# Patient Record
Sex: Male | Born: 1985 | Hispanic: Refuse to answer | Smoking: Former smoker
Health system: Southern US, Community
[De-identification: ages and names within clinical notes are randomized; demographics above are authoritative.]

## PROBLEM LIST (undated history)

## (undated) DIAGNOSIS — T7840XA Allergy, unspecified, initial encounter: Secondary | ICD-10-CM

## (undated) DIAGNOSIS — S91331A Puncture wound without foreign body, right foot, initial encounter: Secondary | ICD-10-CM

## (undated) DIAGNOSIS — R079 Chest pain, unspecified: Secondary | ICD-10-CM

## (undated) DIAGNOSIS — A048 Other specified bacterial intestinal infections: Secondary | ICD-10-CM

## (undated) DIAGNOSIS — N419 Inflammatory disease of prostate, unspecified: Secondary | ICD-10-CM

## (undated) HISTORY — DX: Inflammatory disease of prostate, unspecified: N41.9

## (undated) HISTORY — DX: Puncture wound without foreign body, right foot, initial encounter: S91.331A

## (undated) HISTORY — PX: ROOT CANAL: SHX2363

## (undated) HISTORY — DX: Allergy, unspecified, initial encounter: T78.40XA

## (undated) HISTORY — PX: MOUTH SURGERY: SHX715

## (undated) HISTORY — DX: Chest pain, unspecified: R07.9

---
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
## 2006-02-02 ENCOUNTER — Emergency Department (HOSPITAL_COMMUNITY): Admission: EM | Admit: 2006-02-02 | Discharge: 2006-02-02 | Payer: Self-pay | Admitting: Emergency Medicine

## 2006-02-14 ENCOUNTER — Emergency Department (HOSPITAL_COMMUNITY): Admission: EM | Admit: 2006-02-14 | Discharge: 2006-02-14 | Payer: Self-pay | Admitting: Emergency Medicine

## 2006-05-07 ENCOUNTER — Emergency Department (HOSPITAL_COMMUNITY): Admission: EM | Admit: 2006-05-07 | Discharge: 2006-05-07 | Payer: Self-pay | Admitting: Emergency Medicine

## 2006-05-22 ENCOUNTER — Emergency Department (HOSPITAL_COMMUNITY): Admission: EM | Admit: 2006-05-22 | Discharge: 2006-05-22 | Payer: Self-pay | Admitting: Emergency Medicine

## 2008-02-14 ENCOUNTER — Emergency Department (HOSPITAL_COMMUNITY): Admission: EM | Admit: 2008-02-14 | Discharge: 2008-02-14 | Payer: Self-pay | Admitting: Emergency Medicine

## 2008-06-07 ENCOUNTER — Emergency Department (HOSPITAL_COMMUNITY): Admission: EM | Admit: 2008-06-07 | Discharge: 2008-06-07 | Payer: Self-pay | Admitting: Emergency Medicine

## 2008-11-18 ENCOUNTER — Emergency Department (HOSPITAL_COMMUNITY): Admission: EM | Admit: 2008-11-18 | Discharge: 2008-11-18 | Payer: Self-pay | Admitting: Emergency Medicine

## 2008-12-10 ENCOUNTER — Emergency Department (HOSPITAL_COMMUNITY): Admission: EM | Admit: 2008-12-10 | Discharge: 2008-12-10 | Payer: Self-pay | Admitting: Emergency Medicine

## 2009-03-11 ENCOUNTER — Emergency Department (HOSPITAL_COMMUNITY): Admission: EM | Admit: 2009-03-11 | Discharge: 2009-03-11 | Payer: Self-pay | Admitting: Emergency Medicine

## 2010-11-09 LAB — DIFFERENTIAL
Basophils Absolute: 0 10*3/uL (ref 0.0–0.1)
Basophils Relative: 0 % (ref 0–1)
Eosinophils Absolute: 0 10*3/uL (ref 0.0–0.7)
Eosinophils Relative: 0 % (ref 0–5)
Lymphocytes Relative: 12 % (ref 12–46)
Lymphs Abs: 1 10*3/uL (ref 0.7–4.0)
Monocytes Absolute: 0.4 10*3/uL (ref 0.1–1.0)
Monocytes Relative: 4 % (ref 3–12)
Neutro Abs: 6.8 10*3/uL (ref 1.7–7.7)
Neutrophils Relative %: 83 % — ABNORMAL HIGH (ref 43–77)

## 2010-11-09 LAB — CBC
HCT: 52.1 % — ABNORMAL HIGH (ref 39.0–52.0)
Hemoglobin: 18.1 g/dL — ABNORMAL HIGH (ref 13.0–17.0)
MCHC: 34.8 g/dL (ref 30.0–36.0)
MCV: 99.2 fL (ref 78.0–100.0)
Platelets: 205 10*3/uL (ref 150–400)
RBC: 5.26 MIL/uL (ref 4.22–5.81)
RDW: 13.5 % (ref 11.5–15.5)
WBC: 8.1 10*3/uL (ref 4.0–10.5)

## 2010-11-09 LAB — LIPASE, BLOOD: Lipase: 25 U/L (ref 11–59)

## 2010-11-09 LAB — COMPREHENSIVE METABOLIC PANEL
ALT: 25 U/L (ref 0–53)
AST: 49 U/L — ABNORMAL HIGH (ref 0–37)
Albumin: 4.7 g/dL (ref 3.5–5.2)
Alkaline Phosphatase: 62 U/L (ref 39–117)
BUN: 9 mg/dL (ref 6–23)
CO2: 27 mEq/L (ref 19–32)
Calcium: 10 mg/dL (ref 8.4–10.5)
Chloride: 101 mEq/L (ref 96–112)
Creatinine, Ser: 0.97 mg/dL (ref 0.4–1.5)
GFR calc Af Amer: >60 mL/min (ref >60)
GFR calc non Af Amer: >60 mL/min (ref >60)
Glucose, Bld: 114 mg/dL — ABNORMAL HIGH (ref 70–99)
Potassium: 5.3 mEq/L — ABNORMAL HIGH (ref 3.5–5.1)
Sodium: 139 mEq/L (ref 135–145)
Total Bilirubin: 1.6 mg/dL — ABNORMAL HIGH (ref 0.3–1.2)
Total Protein: 7.5 g/dL (ref 6.0–8.3)

## 2010-11-12 LAB — POCT INFECTIOUS MONO SCREEN: Mono Screen: POSITIVE — AB

## 2010-11-12 LAB — POCT RAPID STREP A (OFFICE): Streptococcus, Group A Screen (Direct): NEGATIVE

## 2010-11-13 LAB — GC/CHLAMYDIA PROBE AMP, GENITAL
Chlamydia, DNA Probe: NEGATIVE
GC Probe Amp, Genital: NEGATIVE

## 2010-11-13 LAB — RPR: RPR Ser Ql: NONREACTIVE

## 2010-11-13 LAB — HIV ANTIBODY (ROUTINE TESTING W REFLEX): HIV: NONREACTIVE

## 2010-11-27 ENCOUNTER — Inpatient Hospital Stay (INDEPENDENT_AMBULATORY_CARE_PROVIDER_SITE_OTHER)
Admission: RE | Admit: 2010-11-27 | Discharge: 2010-11-27 | Disposition: A | Discharge status: Home / Self Care | Payer: Self-pay | Source: Ambulatory Visit | Attending: Family Medicine | Admitting: Family Medicine

## 2010-11-27 DIAGNOSIS — R109 Unspecified abdominal pain: Secondary | ICD-10-CM

## 2010-11-27 LAB — POCT I-STAT, CHEM 8
BUN: 8 mg/dL (ref 6–23)
Calcium, Ion: 1.18 mmol/L (ref 1.12–1.32)
Chloride: 102 mEq/L (ref 96–112)
Creatinine, Ser: 1 mg/dL (ref 0.4–1.5)
Glucose, Bld: 98 mg/dL (ref 70–99)
HCT: 50 % (ref 39.0–52.0)
Hemoglobin: 17 g/dL (ref 13.0–17.0)
Potassium: 3.9 mEq/L (ref 3.5–5.1)
Sodium: 140 mEq/L (ref 135–145)
TCO2: 30 mmol/L (ref 0–100)

## 2010-11-27 LAB — POCT URINALYSIS DIP (DEVICE)
Bilirubin Urine: NEGATIVE
Glucose, UA: NEGATIVE mg/dL
Hgb urine dipstick: NEGATIVE
Ketones, ur: NEGATIVE mg/dL
Nitrite: NEGATIVE
Protein, ur: NEGATIVE mg/dL
Specific Gravity, Urine: 1.02 (ref 1.005–1.030)
Urobilinogen, UA: 1 mg/dL (ref 0.0–1.0)
pH: 7 (ref 5.0–8.0)

## 2011-04-30 ENCOUNTER — Inpatient Hospital Stay (INDEPENDENT_AMBULATORY_CARE_PROVIDER_SITE_OTHER)
Admission: RE | Admit: 2011-04-30 | Discharge: 2011-04-30 | Disposition: A | Discharge status: Home / Self Care | Payer: Self-pay | Source: Ambulatory Visit | Attending: Family Medicine | Admitting: Family Medicine

## 2011-04-30 DIAGNOSIS — J029 Acute pharyngitis, unspecified: Secondary | ICD-10-CM

## 2011-04-30 DIAGNOSIS — R51 Headache: Secondary | ICD-10-CM

## 2011-05-02 ENCOUNTER — Emergency Department (HOSPITAL_COMMUNITY)
Admission: EM | Admit: 2011-05-02 | Discharge: 2011-05-03 | Disposition: A | Discharge status: Home / Self Care | Payer: Self-pay | Attending: Emergency Medicine | Admitting: Emergency Medicine

## 2011-05-02 DIAGNOSIS — J029 Acute pharyngitis, unspecified: Secondary | ICD-10-CM | POA: Insufficient documentation

## 2011-05-02 DIAGNOSIS — B9789 Other viral agents as the cause of diseases classified elsewhere: Secondary | ICD-10-CM | POA: Insufficient documentation

## 2011-05-02 DIAGNOSIS — R112 Nausea with vomiting, unspecified: Secondary | ICD-10-CM | POA: Insufficient documentation

## 2011-05-02 DIAGNOSIS — R197 Diarrhea, unspecified: Secondary | ICD-10-CM | POA: Insufficient documentation

## 2011-05-02 DIAGNOSIS — R599 Enlarged lymph nodes, unspecified: Secondary | ICD-10-CM | POA: Insufficient documentation

## 2011-05-02 DIAGNOSIS — J45909 Unspecified asthma, uncomplicated: Secondary | ICD-10-CM | POA: Insufficient documentation

## 2011-06-16 ENCOUNTER — Emergency Department (INDEPENDENT_AMBULATORY_CARE_PROVIDER_SITE_OTHER)
Admission: EM | Admit: 2011-06-16 | Discharge: 2011-06-16 | Disposition: A | Discharge status: Home / Self Care | Payer: Self-pay | Source: Home / Self Care | Attending: Emergency Medicine | Admitting: Emergency Medicine

## 2011-06-16 DIAGNOSIS — J45909 Unspecified asthma, uncomplicated: Secondary | ICD-10-CM

## 2011-06-16 DIAGNOSIS — R110 Nausea: Secondary | ICD-10-CM

## 2011-06-16 DIAGNOSIS — Z202 Contact with and (suspected) exposure to infections with a predominantly sexual mode of transmission: Secondary | ICD-10-CM

## 2011-06-16 LAB — HIV ANTIBODY (ROUTINE TESTING W REFLEX): HIV: NONREACTIVE

## 2011-06-16 MED ORDER — LIDOCAINE HCL (PF) 1 % IJ SOLN
INTRAMUSCULAR | Status: AC
  Filled 2011-06-16: qty 5

## 2011-06-16 MED ORDER — AZITHROMYCIN 250 MG PO TABS
1000.0000 mg | ORAL_TABLET | Freq: Once | ORAL | Status: AC
  Administered 2011-06-16: 1000 mg via ORAL

## 2011-06-16 MED ORDER — CEFTRIAXONE SODIUM 250 MG IJ SOLR
INTRAMUSCULAR | Status: AC
  Filled 2011-06-16: qty 250

## 2011-06-16 MED ORDER — AZITHROMYCIN 250 MG PO TABS
ORAL_TABLET | ORAL | Status: AC
  Filled 2011-06-16: qty 4

## 2011-06-16 MED ORDER — ONDANSETRON 8 MG PO TBDP: 8.0000 mg | ORAL_TABLET | Freq: Three times a day (TID) | ORAL | Status: AC | PRN

## 2011-06-16 MED ORDER — CEFTRIAXONE SODIUM 250 MG IJ SOLR
250.0000 mg | Freq: Once | INTRAMUSCULAR | Status: AC
  Administered 2011-06-16: 250 mg via INTRAMUSCULAR

## 2011-06-16 MED ORDER — ALBUTEROL SULFATE HFA 108 (90 BASE) MCG/ACT IN AERS: 1.0000 | INHALATION_SPRAY | Freq: Four times a day (QID) | RESPIRATORY_TRACT | Status: DC | PRN

## 2011-06-16 NOTE — ED Provider Notes (Signed)
History     CSN: [id number] Arrival date & time: 06/16/2011  4:36 PM   First MD Initiated Contact with Patient 06/16/11 1657      Chief Complaint  Patient presents with  . Exposure to STD    Pt. states that girlfriend told him she gonahrea today.  Pt. denies any symptoms.      (Consider location/radiation/quality/duration/timing/severity/associated sxs/prior treatment) HPI Comments: Michael Rogers was informed by is girlfriend that she has gonorrhea. His last sexual intercourse was 2 weeks ago. He himself denies any discharge, dysuria, penile pain, testicular pain, adenopathy, abdominal pain, fever, chills, or skin rash. He states he was incarcerated several months ago and when he got out he was tested for all STDs and found to be negative.  He also has asthma which is controlled with when necessary albuterol via [facility] inhaler. He states he uses this infrequently. He also has vomiting just about every morning. He was evaluated for this and told he might have an ulcer. He has been given promethazine in the past but the promethazine tasted bad and he did not like to take it.  Patient is a 25 y.o. male presenting with STD exposure.  Exposure to STD Pertinent negatives include no abdominal pain.    Past Medical History  Diagnosis Date  . Asthma     History reviewed. No pertinent past surgical history.  History reviewed. No pertinent family history.  History  Substance Use Topics  . Smoking status: Current Some Day Smoker    Types: Cigarettes  . Smokeless tobacco: Not on file  . Alcohol Use: No      Review of Systems  Constitutional: Negative for fever and chills.  Respiratory: Positive for wheezing.   Gastrointestinal: Positive for nausea and vomiting. Negative for abdominal pain.  Genitourinary: Negative for dysuria, urgency, frequency, hematuria, flank pain, discharge, penile swelling, scrotal swelling, genital sores, penile pain and testicular pain.    Allergies  Review of  patient's allergies indicates no known allergies.  Home Medications   Current Outpatient Rx  Name Route Sig Dispense Refill  . ALBUTEROL SULFATE HFA 108 (90 BASE) MCG/ACT IN AERS Inhalation Inhale 1-2 puffs into the lungs every 6 (six) hours as needed for wheezing. 1 Inhaler 0  . ONDANSETRON 8 MG PO TBDP Oral Take 1 tablet (8 mg total) by mouth every 8 (eight) hours as needed for nausea. 20 tablet 0    BP 132/85  Pulse 60  Temp(Src) 99.4 F (37.4 C) (Oral)  Resp 14  SpO2 100%  Physical Exam  Nursing note and vitals reviewed. Constitutional: He appears well-developed and well-nourished. No distress.  Cardiovascular: Normal rate and regular rhythm.  Exam reveals no gallop and no friction rub.   No murmur heard. Pulmonary/Chest: Effort normal. No respiratory distress. He has no wheezes. He has no rales.  Abdominal: Soft. Bowel sounds are normal. He exhibits no distension and no mass. There is no hepatosplenomegaly. There is no tenderness. There is no rebound, no guarding and no CVA tenderness.  Genitourinary: Penis normal. No penile tenderness.       Exam of the external genitalia was unremarkable. There were no lesions of the penis, no urethral discharge, no penile tenderness to palpation, no testicular tenderness, mass, or inguinal lymphadenopathy.  Skin: He is not diaphoretic.    ED Course  Procedures (including critical care time)   Labs Reviewed  GC/CHLAMYDIA PROBE AMP, GENITAL  RPR   No results found.   1. Exposure to STD   2.  Asthma   3. Nausea       MDM  He has been exposed to gonorrhea but right now has no symptoms. We'll treat him gonorrhea after culturing and obtaining is for Chlamydia, RPR, and HIV. He was given Zofran for the nausea and an albuterol inhaler.        Roque Lias, MD 06/16/11 1754

## 2011-06-16 NOTE — ED Notes (Signed)
Pt. states that girlfriend told him she gonahrea today.  Pt. denies any symptoms.

## 2011-06-17 LAB — GC/CHLAMYDIA PROBE AMP, GENITAL
Chlamydia, DNA Probe: NEGATIVE
GC Probe Amp, Genital: NEGATIVE

## 2011-06-17 LAB — RPR: RPR Ser Ql: NONREACTIVE

## 2012-01-21 ENCOUNTER — Encounter: Payer: Self-pay | Admitting: Family Medicine

## 2012-01-21 ENCOUNTER — Ambulatory Visit (INDEPENDENT_AMBULATORY_CARE_PROVIDER_SITE_OTHER): Payer: Self-pay | Admitting: Family Medicine

## 2012-01-21 ENCOUNTER — Other Ambulatory Visit (HOSPITAL_COMMUNITY)
Admission: RE | Admit: 2012-01-21 | Discharge: 2012-01-21 | Disposition: A | Discharge status: Home / Self Care | Payer: Self-pay | Source: Ambulatory Visit | Attending: Family Medicine | Admitting: Family Medicine

## 2012-01-21 VITALS — BP 120/83 | HR 109 | Ht 68.0 in | Wt 150.0 lb

## 2012-01-21 DIAGNOSIS — Z2089 Contact with and (suspected) exposure to other communicable diseases: Secondary | ICD-10-CM

## 2012-01-21 DIAGNOSIS — Z113 Encounter for screening for infections with a predominantly sexual mode of transmission: Secondary | ICD-10-CM

## 2012-01-21 DIAGNOSIS — Z202 Contact with and (suspected) exposure to infections with a predominantly sexual mode of transmission: Secondary | ICD-10-CM

## 2012-01-21 DIAGNOSIS — R51 Headache: Secondary | ICD-10-CM

## 2012-01-21 DIAGNOSIS — J309 Allergic rhinitis, unspecified: Secondary | ICD-10-CM

## 2012-01-21 DIAGNOSIS — K219 Gastro-esophageal reflux disease without esophagitis: Secondary | ICD-10-CM

## 2012-01-21 DIAGNOSIS — J4520 Mild intermittent asthma, uncomplicated: Secondary | ICD-10-CM

## 2012-01-21 DIAGNOSIS — J45909 Unspecified asthma, uncomplicated: Secondary | ICD-10-CM

## 2012-01-21 DIAGNOSIS — A048 Other specified bacterial intestinal infections: Secondary | ICD-10-CM | POA: Insufficient documentation

## 2012-01-21 DIAGNOSIS — IMO0001 Reserved for inherently not codable concepts without codable children: Secondary | ICD-10-CM

## 2012-01-21 DIAGNOSIS — G8929 Other chronic pain: Secondary | ICD-10-CM

## 2012-01-21 DIAGNOSIS — J302 Other seasonal allergic rhinitis: Secondary | ICD-10-CM

## 2012-01-21 MED ORDER — PANTOPRAZOLE SODIUM 40 MG PO TBEC: 40.0000 mg | DELAYED_RELEASE_TABLET | Freq: Two times a day (BID) | ORAL | Status: DC

## 2012-01-21 MED ORDER — ALBUTEROL SULFATE HFA 108 (90 BASE) MCG/ACT IN AERS: 1.0000 | INHALATION_SPRAY | Freq: Four times a day (QID) | RESPIRATORY_TRACT | Status: DC | PRN

## 2012-01-21 MED ORDER — AMOXICILLIN 500 MG PO CAPS: 1000.0000 mg | ORAL_CAPSULE | Freq: Two times a day (BID) | ORAL | Status: AC

## 2012-01-21 MED ORDER — METRONIDAZOLE 250 MG PO TABS: 250.0000 mg | ORAL_TABLET | Freq: Four times a day (QID) | ORAL | Status: AC

## 2012-01-21 NOTE — Assessment & Plan Note (Signed)
Checked per patient request.

## 2012-01-21 NOTE — Assessment & Plan Note (Signed)
Refilled albuterol  Advised if using more than twice per week, need office visit for further evaluation.

## 2012-01-21 NOTE — Patient Instructions (Addendum)
Take zyrtec or claritin or allegra for allergies.  Keep headache log and bring back to next visit Triple therapy for H Pylori ulcer Follow-up in 1 month  Helicobacter Pylori Disease Often patients with stomach or duodenal ulcers not caused by irritants, are infected with a germ. The germ is called helicobacter pylori (H. pylori). This bacterium lives on the surface of stomach and small bowel. It can cause redness, soreness and ulcers. Ulcers are a hole in the lining of your stomach or small bowel. Blood and special breath tests can detect if you are infected with H. pylori. Tests can be done on samples taken from the stomach if you have endoscopy. After treatment you may have tests to prove you are cured. These can be done about a month after you finish the treatment or as your caregiver suggests. Most infections can be cured with a combination of antibiotics. Antibiotics are medications which kill germs such as H. pylori. Anti-ulcer medicines which block stomach acid secretion may also be used. Treatment will be continued for the time your caregiver suggests. Call your caregiver if you need more information about H. pylori. Call also if your symptoms get worse during or after treatment. You will not need a special diet. Avoid:  Smoking.   Aspirin.   Ibuprofen.   Other anti-inflammatory drugs.  Alcohol and spicy foods may also make your symptoms worse. The best advice is to avoid anything you find upsetting to your stomach. SEEK IMMEDIATE MEDICAL CARE IF:  You develop sharp, sudden, lasting stomach pain.   You have bloody vomit or vomit that looks like coffee grounds.   You have bloody or black stools.   You develop a lightheaded feeling, fainting, or become weak and sweaty.  Document Released: 07/21/2005 Document Revised: 07/10/2011 Document Reviewed: 01/06/2007 [facility] Patient Information 2012 [place], [place].
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
## 2012-01-21 NOTE — Assessment & Plan Note (Signed)
Advised on using second generation antihistamine

## 2012-01-21 NOTE — Assessment & Plan Note (Signed)
Chronic headache, migrainous in nature.  No red flags.  Will not add NSAID at this time due to [facility] Pylori treatment.  Gave him headache log to fill out and bring back at next appt for further discussion.  We discussed approach to overall health including stopping tobacco and marijuana as important.

## 2012-01-21 NOTE — Assessment & Plan Note (Signed)
rxed triple antibiotic therapy with amoxicillin, metronidazole (clarithromycin cost prohibitive), and pantroprazole (patient states intolerant to omeprazole)  Discussed food choiced and stopping smoking to also help with reflux

## 2012-01-23 ENCOUNTER — Encounter: Payer: Self-pay | Admitting: Family Medicine

## 2012-02-20 ENCOUNTER — Encounter: Payer: Self-pay | Admitting: Family Medicine

## 2012-02-20 ENCOUNTER — Ambulatory Visit (INDEPENDENT_AMBULATORY_CARE_PROVIDER_SITE_OTHER): Payer: Self-pay | Admitting: Family Medicine

## 2012-02-20 VITALS — BP 113/73 | HR 75 | Ht 68.0 in | Wt 153.0 lb

## 2012-02-20 DIAGNOSIS — A048 Other specified bacterial intestinal infections: Secondary | ICD-10-CM

## 2012-02-20 DIAGNOSIS — J45909 Unspecified asthma, uncomplicated: Secondary | ICD-10-CM

## 2012-02-20 DIAGNOSIS — Z720 Tobacco use: Secondary | ICD-10-CM

## 2012-02-20 DIAGNOSIS — F17200 Nicotine dependence, unspecified, uncomplicated: Secondary | ICD-10-CM

## 2012-02-20 DIAGNOSIS — J4520 Mild intermittent asthma, uncomplicated: Secondary | ICD-10-CM

## 2012-02-20 NOTE — Assessment & Plan Note (Signed)
advised as he is not yet done with triple therapy, to give a fe more weeks for full resolution.  Will plan on continuing PI once daily

## 2012-02-20 NOTE — Assessment & Plan Note (Signed)
Encouraged flu shot in fall

## 2012-02-20 NOTE — Progress Notes (Signed)
  Subjective:    Patient ID: Michael Rogers, male    DOB: November 19, 1985, 26 y.o.   MRN: [id number]  HPI Here for one month follow-up of new patient appt.  Also plans to follow-up with Jaynee Eagles this afternoon.  H. Pylori:  Is still finishing two week course of triple therapy (started late)  Having some dyspepsia.  Somewhat improving.  Having some loose stools.    Asthma:   Has not needed to use albuterol  Tobacco use:  Smoking 1-3 cig per day.  Plans on quitting. Review of Systems See HPI    Objective:   Physical Exam GEN: Alert & Oriented, No acute distress CV:  Regular Rate & Rhythm, no murmur Respiratory:  Normal work of breathing, CTAB Abd:  + BS, soft, no tenderness to palpation         Assessment & Plan:

## 2012-02-20 NOTE — Assessment & Plan Note (Signed)
Low nicotine dependence.  Discussed tips for quitting and behaviors he can substitute- states his biggest ally is his girlfriend who serves to remind him as a deterrent to smoking.

## 2012-02-20 NOTE — Patient Instructions (Addendum)
Try probiotics such as yogurt with live cultures Michael Rogers) to help with loose stools after diarrhea  Great job with cutting back smoking  Get your flu and tetanus shot in the fall.  Follow-up yearly or as needed.

## 2012-04-02 ENCOUNTER — Ambulatory Visit: Payer: Self-pay | Admitting: Family Medicine

## 2012-04-05 ENCOUNTER — Emergency Department (HOSPITAL_COMMUNITY)
Admission: EM | Admit: 2012-04-05 | Discharge: 2012-04-05 | Disposition: A | Discharge status: Home / Self Care | Payer: Self-pay | Attending: Emergency Medicine | Admitting: Emergency Medicine

## 2012-04-05 ENCOUNTER — Encounter (HOSPITAL_COMMUNITY): Payer: Self-pay | Admitting: *Deleted

## 2012-04-05 DIAGNOSIS — F17200 Nicotine dependence, unspecified, uncomplicated: Secondary | ICD-10-CM | POA: Insufficient documentation

## 2012-04-05 DIAGNOSIS — T6391XA Toxic effect of contact with unspecified venomous animal, accidental (unintentional), initial encounter: Secondary | ICD-10-CM | POA: Insufficient documentation

## 2012-04-05 DIAGNOSIS — J45909 Unspecified asthma, uncomplicated: Secondary | ICD-10-CM | POA: Insufficient documentation

## 2012-04-05 DIAGNOSIS — T63461A Toxic effect of venom of wasps, accidental (unintentional), initial encounter: Secondary | ICD-10-CM | POA: Insufficient documentation

## 2012-04-05 DIAGNOSIS — Z8249 Family history of ischemic heart disease and other diseases of the circulatory system: Secondary | ICD-10-CM | POA: Insufficient documentation

## 2012-04-05 DIAGNOSIS — Z841 Family history of disorders of kidney and ureter: Secondary | ICD-10-CM | POA: Insufficient documentation

## 2012-04-05 HISTORY — DX: Other specified bacterial intestinal infections: A04.8

## 2012-04-05 MED ORDER — DIPHENHYDRAMINE HCL 25 MG PO CAPS
25.0000 mg | ORAL_CAPSULE | Freq: Once | ORAL | Status: AC
  Administered 2012-04-05: 25 mg via ORAL
  Filled 2012-04-05: qty 1

## 2012-04-05 NOTE — ED Provider Notes (Signed)
History     CSN: [id number]  Arrival date & time 04/05/12  Paulo Fruit   First MD Initiated Contact with Patient 04/05/12 1914      Chief Complaint  Patient presents with  . Insect Bite    (Consider location/radiation/quality/duration/timing/severity/associated sxs/prior treatment) HPI Comments: Patient reports that yesterday he was bitten by a yellow jacket on his right forearm.  He reports that he has had some swelling and pain of the area.  He was concerned that he was having an allergic reaction.  He denies any swelling of his throat, lips, or tongue.  Denies shortness of breath.  He has not taken anything for his symptoms.  The history is provided by the patient.    Past Medical History  Diagnosis Date  . Asthma   . Allergy   . H. pylori infection     History reviewed. No pertinent past surgical history.  Family History  Problem Relation Age of Onset  . Kidney disease Father   . Hypertension Paternal Grandmother     History  Substance Use Topics  . Smoking status: Current Some Day Smoker    Types: Cigarettes  . Smokeless tobacco: Not on file  . Alcohol Use: No      Review of Systems  Constitutional: Negative for fever and chills.  HENT: Negative for drooling and trouble swallowing.   Respiratory: Negative for shortness of breath.   Skin: Positive for color change.    Allergies  Review of patient's allergies indicates no known allergies.  Home Medications   Current Outpatient Rx  Name Route Sig Dispense Refill  . ALBUTEROL SULFATE HFA 108 (90 BASE) MCG/ACT IN AERS Inhalation Inhale 1-2 puffs into the lungs every 6 (six) hours as needed.    Marland Kitchen PANTOPRAZOLE SODIUM 40 MG PO TBEC Oral Take 40 mg by mouth 2 (two) times daily. For 14 days      BP 116/69  Pulse 73  Temp 98.9 F (37.2 C) (Oral)  Resp 18  SpO2 99%  Physical Exam  Nursing note and vitals reviewed. Constitutional: He appears well-developed and well-nourished. No distress.  HENT:  Head:  Normocephalic and atraumatic.  Mouth/Throat: Oropharynx is clear and moist.       No swelling of the lips or tongue Airway patent.    Neck: Normal range of motion.  Cardiovascular: Normal rate, regular rhythm and normal heart sounds.   Pulses:      Radial pulses are 2+ on the right side, and 2+ on the left side.  Pulmonary/Chest: Effort normal and breath sounds normal. No stridor.  Musculoskeletal:       Arms: Neurological: He is alert. No sensory deficit.  Skin: Skin is warm and dry. He is not diaphoretic.  Psychiatric: He has a normal mood and affect.    ED Course  Procedures (including critical care time)  Labs Reviewed - No data to display No results found.   No diagnosis found.    MDM  Patient comes in after being bitten by a yellow jacket yesterday.  Mild erythema and swelling at the area of the bite.  No respiratory distress.  No angioedema.  Patient discharged home and instructed to take Benadryl for swelling and itching.         Pascal Lux Guttenberg, PA-C 04/06/12 1138

## 2012-04-05 NOTE — ED Notes (Signed)
Rt lower arm stung by yellow jackets yesterday.

## 2012-04-07 NOTE — ED Provider Notes (Signed)
Medical screening examination/treatment/procedure(s) were performed by non-physician practitioner and as supervising physician I was immediately available for consultation/collaboration.    Laird Runnion R Caeden Foots, MD 04/07/12 1059 

## 2012-09-20 ENCOUNTER — Encounter (HOSPITAL_COMMUNITY): Payer: Self-pay | Admitting: Emergency Medicine

## 2012-09-20 ENCOUNTER — Emergency Department (HOSPITAL_COMMUNITY)
Admission: EM | Admit: 2012-09-20 | Discharge: 2012-09-20 | Disposition: A | Discharge status: Home / Self Care | Payer: Self-pay | Attending: Emergency Medicine | Admitting: Emergency Medicine

## 2012-09-20 DIAGNOSIS — J45909 Unspecified asthma, uncomplicated: Secondary | ICD-10-CM | POA: Insufficient documentation

## 2012-09-20 DIAGNOSIS — M722 Plantar fascial fibromatosis: Secondary | ICD-10-CM | POA: Insufficient documentation

## 2012-09-20 DIAGNOSIS — Z79899 Other long term (current) drug therapy: Secondary | ICD-10-CM | POA: Insufficient documentation

## 2012-09-20 DIAGNOSIS — Z8619 Personal history of other infectious and parasitic diseases: Secondary | ICD-10-CM | POA: Insufficient documentation

## 2012-09-20 DIAGNOSIS — F17200 Nicotine dependence, unspecified, uncomplicated: Secondary | ICD-10-CM | POA: Insufficient documentation

## 2012-09-20 MED ORDER — NAPROXEN 500 MG PO TABS: 500.0000 mg | ORAL_TABLET | Freq: Two times a day (BID) | ORAL | Status: DC

## 2012-09-20 NOTE — Progress Notes (Signed)
Orthopedic Tech Progress Note Patient Details:  Michael Rogers 1986/04/21 [id number]  Ortho Devices Type of Ortho Device: Crutches;Postop shoe/boot Ortho Device/Splint Location: LEFT POST OP SHOE AND CRUTCHES Ortho Device/Splint Interventions: Application   Cammer, Mickie Bail 09/20/2012, 5:58 PM

## 2012-09-20 NOTE — ED Notes (Signed)
Pt st's his arch fell in left foot 2 yrs ago.  St's last week he started having pain in bottom of left foot.

## 2012-09-20 NOTE — ED Notes (Signed)
Patient is alert and orientedx4.  Patient was explained discharge instructions and they understood them with no questions.   

## 2012-09-20 NOTE — ED Notes (Signed)
Patient says he did not injure the foot, he just started experiencing pain.  He says it came from wearing steel toed boots and messing up his arches.

## 2012-09-20 NOTE — ED Provider Notes (Signed)
History     CSN: [id number]  Arrival date & time 09/20/12  1524   First MD Initiated Contact with Patient 09/20/12 1618      Chief Complaint  Patient presents with  . Foot Pain    (Consider location/radiation/quality/duration/timing/severity/associated sxs/prior treatment) Patient is a 27 y.o. male presenting with lower extremity pain.  Foot Pain   Pt with history of 'fallen arches' reports moderate aching R foot pain since yesterday, worse first thing in the morning. Improved during the day, but still hurts with walking. Pain is on the plantar foot. No known injury.  Past Medical History  Diagnosis Date  . Asthma   . Allergy   . H. pylori infection     History reviewed. No pertinent past surgical history.  Family History  Problem Relation Age of Onset  . Kidney disease Father   . Hypertension Paternal Grandmother     History  Substance Use Topics  . Smoking status: Current Some Day Smoker    Types: Cigarettes  . Smokeless tobacco: Not on file  . Alcohol Use: No      Review of Systems All other systems reviewed and are negative except as noted in HPI.   Allergies  Review of patient's allergies indicates no known allergies.  Home Medications   Current Outpatient Rx  Name  Route  Sig  Dispense  Refill  . albuterol (PROVENTIL HFA;VENTOLIN HFA) 108 (90 BASE) MCG/ACT inhaler   Inhalation   Inhale 1-2 puffs into the lungs every 6 (six) hours as needed.         . pantoprazole (PROTONIX) 40 MG tablet   Oral   Take 40 mg by mouth 2 (two) times daily. For 14 days           BP 143/66  Pulse 98  Temp(Src) 98.4 F (36.9 C) (Oral)  Resp 16  SpO2 98%  Physical Exam  Constitutional: He is oriented to person, place, and time. He appears well-developed and well-nourished.  HENT:  Head: Normocephalic and atraumatic.  Neck: Neck supple.  Pulmonary/Chest: Effort normal.  Musculoskeletal: Normal range of motion. He exhibits tenderness (Mild tenderness R  plantar foot, worse toward the heel). He exhibits no edema.  Neurological: He is alert and oriented to person, place, and time. No cranial nerve deficit.  Psychiatric: He has a normal mood and affect. His behavior is normal.    ED Course  Procedures (including critical care time)  Labs Reviewed - No data to display No results found.   No diagnosis found.    MDM  Likely plantar fasciitis. Post-op Shoe, crutches, NSAIDs and rest.         Leonette Most B. Bernette Mayers, MD 09/20/12 [phone]

## 2012-10-28 ENCOUNTER — Emergency Department (HOSPITAL_COMMUNITY): Payer: Self-pay

## 2012-10-28 ENCOUNTER — Encounter (HOSPITAL_COMMUNITY): Payer: Self-pay | Admitting: Adult Health

## 2012-10-28 ENCOUNTER — Emergency Department (HOSPITAL_COMMUNITY)
Admission: EM | Admit: 2012-10-28 | Discharge: 2012-10-28 | Disposition: A | Discharge status: Home / Self Care | Payer: Self-pay | Attending: Emergency Medicine | Admitting: Emergency Medicine

## 2012-10-28 DIAGNOSIS — R059 Cough, unspecified: Secondary | ICD-10-CM | POA: Insufficient documentation

## 2012-10-28 DIAGNOSIS — Z8619 Personal history of other infectious and parasitic diseases: Secondary | ICD-10-CM | POA: Insufficient documentation

## 2012-10-28 DIAGNOSIS — R05 Cough: Secondary | ICD-10-CM | POA: Insufficient documentation

## 2012-10-28 DIAGNOSIS — F17200 Nicotine dependence, unspecified, uncomplicated: Secondary | ICD-10-CM | POA: Insufficient documentation

## 2012-10-28 DIAGNOSIS — J069 Acute upper respiratory infection, unspecified: Secondary | ICD-10-CM | POA: Insufficient documentation

## 2012-10-28 DIAGNOSIS — J45909 Unspecified asthma, uncomplicated: Secondary | ICD-10-CM | POA: Insufficient documentation

## 2012-10-28 DIAGNOSIS — J3489 Other specified disorders of nose and nasal sinuses: Secondary | ICD-10-CM | POA: Insufficient documentation

## 2012-10-28 DIAGNOSIS — R6889 Other general symptoms and signs: Secondary | ICD-10-CM | POA: Insufficient documentation

## 2012-10-28 MED ORDER — HYDROCOD POLST-CHLORPHEN POLST 10-8 MG/5ML PO LQCR: 5.0000 mL | Freq: Two times a day (BID) | ORAL | Status: DC | PRN

## 2012-10-28 NOTE — ED Provider Notes (Signed)
History     CSN: [id number]  Arrival date & time 10/28/12  1925   First MD Initiated Contact with Patient 10/28/12 2107      Chief Complaint  Patient presents with  . Cough    (Consider location/radiation/quality/duration/timing/severity/associated sxs/prior treatment) HPI History provided by pt.   Pt had had a cough productive of thin, yellow, blood-streaked mucous x nearly 2 weeks.  Onset preceded by sinus/ear pressure, nasal congestion, rhinorrhea,  and scratchy throat, all of which have persisted.  Denies fever, CP, SOB and LE edema/pain.  No known sick contacts.  Remote h/o asthma but otherwise healthy.  No RF for PE.  Was incarcerated 3 years ago.   Past Medical History  Diagnosis Date  . Asthma   . Allergy   . H. pylori infection     History reviewed. No pertinent past surgical history.  Family History  Problem Relation Age of Onset  . Kidney disease Father   . Hypertension Paternal Grandmother     History  Substance Use Topics  . Smoking status: Current Some Day Smoker    Types: Cigarettes  . Smokeless tobacco: Not on file  . Alcohol Use: No      Review of Systems  All other systems reviewed and are negative.    Allergies  Review of patient's allergies indicates no known allergies.  Home Medications   Current Outpatient Rx  Name  Route  Sig  Dispense  Refill  . ibuprofen (ADVIL,MOTRIN) 200 MG tablet   Oral   Take 400 mg by mouth 2 (two) times daily as needed for pain.         Marland Kitchen Phenylephrine-DM-GG-APAP (TYLENOL COLD/FLU SEVERE PO)   Oral   Take 1 Container by mouth every 2 (two) hours as needed (for cough). 1 capful         . Pseudoeph-Doxylamine-DM-APAP (NYQUIL PO)   Oral   Take 1 Container by mouth at bedtime as needed (for cough). 1 capful         . chlorpheniramine-HYDROcodone (TUSSIONEX PENNKINETIC ER) 10-8 MG/5ML LQCR   Oral   Take 5 mLs by mouth every 12 (twelve) hours as needed.   40 mL   0     BP 133/90  Pulse 104   Temp(Src) 98.8 F (37.1 C) (Oral)  Resp 14  SpO2 98%  Physical Exam  Nursing note and vitals reviewed. Constitutional: He is oriented to person, place, and time. He appears well-developed and well-nourished. No distress.  HENT:  Head: Normocephalic and atraumatic.  Rhinorrhea.  Left frontal sinus ttp.  Posterior pharynx, soft palate and tonsils w/out edema, erythema or exudate.  No trismus.   Eyes:  Normal appearance  Neck: Normal range of motion.  Cardiovascular: Normal rate and regular rhythm.   Pulmonary/Chest: Effort normal and breath sounds normal. No respiratory distress.  coughing  Musculoskeletal: Normal range of motion.  No LE edema/ttp  Lymphadenopathy:    He has no cervical adenopathy.  Neurological: He is alert and oriented to person, place, and time.  Skin: Skin is warm and dry. No rash noted.  Psychiatric: He has a normal mood and affect. His behavior is normal.    ED Course  Procedures (including critical care time)  Labs Reviewed - No data to display Dg Chest 2 View  10/28/2012  *RADIOLOGY REPORT*  Clinical Data: Cough.  CHEST - 2 VIEW  Comparison: None  Findings: The cardiac silhouette, mediastinal and hilar contours are normal.  The lungs are clear.  No pleural effusion.  The bony thorax is intact.  IMPRESSION: No acute cardiopulmonary findings.   Original Report Authenticated By: Rudie Meyer, M.D.      1. Viral URI with cough       MDM  27yo healthy M w/ remote h/o asthma, otherwise healthy, presents w/ cough productive of blood-streaked sputum + sinus pressure, nasal congestion, rhinorrhea, sore throat x 1.5 weeks.  Afebrile, VS w/in nml range, no respiratory distress, nml breath sounds, coughing on exam.  No RF for or exam findings suggestive of PE.  CXR negative for pneumonia.  Results discussed w/ pt.  Pt reports intolerable SEs w/ albuterol inhaler.  Will prescribe tussionex.  Because patient was incarcerated 3 years ago, advised him to f/u with his  PCP if cough/hemoptysis has not improved in 1 week.  Return precautions discussed. 10:09 PM         Otilio Miu, PA-C 10/28/12 2210

## 2012-10-28 NOTE — ED Notes (Addendum)
Presents with sinus pressure, headache and nausea for two weeks, he states OTC medications have not helped. Coughing makes pain worse, he reports chills that began today. Pt is afebrile. In no acute distress, reports productive cough, yellow in color.

## 2012-10-29 NOTE — ED Provider Notes (Signed)
Medical screening examination/treatment/procedure(s) were performed by non-physician practitioner and as supervising physician I was immediately available for consultation/collaboration.   Gwyneth Sprout, MD 10/29/12 [phone]

## 2013-03-15 ENCOUNTER — Encounter: Payer: Self-pay | Admitting: Family Medicine

## 2013-03-15 ENCOUNTER — Ambulatory Visit (INDEPENDENT_AMBULATORY_CARE_PROVIDER_SITE_OTHER): Payer: Self-pay | Admitting: Family Medicine

## 2013-03-15 ENCOUNTER — Other Ambulatory Visit (HOSPITAL_COMMUNITY)
Admission: RE | Admit: 2013-03-15 | Discharge: 2013-03-15 | Disposition: A | Discharge status: Home / Self Care | Payer: Self-pay | Source: Ambulatory Visit | Attending: Family Medicine | Admitting: Family Medicine

## 2013-03-15 VITALS — BP 119/81 | HR 65 | Temp 98.1°F | Wt 169.0 lb

## 2013-03-15 DIAGNOSIS — Z113 Encounter for screening for infections with a predominantly sexual mode of transmission: Secondary | ICD-10-CM

## 2013-03-15 DIAGNOSIS — Z202 Contact with and (suspected) exposure to infections with a predominantly sexual mode of transmission: Secondary | ICD-10-CM

## 2013-03-15 DIAGNOSIS — Z2089 Contact with and (suspected) exposure to other communicable diseases: Secondary | ICD-10-CM

## 2013-03-15 NOTE — Patient Instructions (Addendum)
Thank you for coming in today We will let you know if any of your lab work comes back positive Remember to use condoms to prevent STDs Follow up with Dr. [name] for your routine health check up Sexually Transmitted Disease Sexually transmitted disease (STD) refers to any infection that is passed from person to person during sexual activity. This may happen by way of saliva, semen, blood, vaginal mucus, or urine. Common STDs include:  Gonorrhea.  Chlamydia.  Syphilis.  HIV/AIDS.  Genital herpes.  Hepatitis B and C.  Trichomonas.  Human papillomavirus (HPV).  Pubic lice. CAUSES  An STD may be spread by bacteria, virus, or parasite. A person can get an STD by:  Sexual intercourse with an infected person.  Sharing sex toys with an infected person.  Sharing needles with an infected person.  Having intimate contact with the genitals, mouth, or rectal areas of an infected person. SYMPTOMS  Some people may not have any symptoms, but they can still pass the infection to others. Different STDs have different symptoms. Symptoms include:  Painful or bloody urination.  Pain in the pelvis, abdomen, vagina, anus, throat, or eyes.  Skin rash, itching, irritation, growths, or sores (lesions). These usually occur in the genital or anal area.  Abnormal vaginal discharge.  Penile discharge in men.  Soft, flesh-colored skin growths in the genital or anal area.  Fever.  Pain or bleeding during sexual intercourse.  Swollen glands in the groin area.  Yellow skin and eyes (jaundice). This is seen with hepatitis. DIAGNOSIS  To make a diagnosis, your caregiver may:  Take a medical history.  Perform a physical exam.  Take a specimen (culture) to be examined.  Examine a sample of discharge under a microscope.  Perform blood tests.  Perform a Pap test, if this applies.  Perform a colposcopy.  Perform a laparoscopy. TREATMENT   Chlamydia, gonorrhea, trichomonas, and  syphilis can be cured with antibiotic medicine.  Genital herpes, hepatitis, and HIV can be treated, but not cured, with prescribed medicines. The medicines will lessen the symptoms.  Genital warts from HPV can be treated with medicine or by freezing, burning (electrocautery), or surgery. Warts may come back.  HPV is a virus and cannot be cured with medicine or surgery.However, abnormal areas may be followed very closely by your caregiver and may be removed from the cervix, vagina, or vulva through office procedures or surgery. If your diagnosis is confirmed, your recent sexual partners need treatment. This is true even if they are symptom-free or have a negative culture or evaluation. They should not have sex until their caregiver says it is okay. HOME CARE INSTRUCTIONS  All sexual partners should be informed, tested, and treated for all STDs.  Take your antibiotics as directed. Finish them even if you start to feel better.  Only take over-the-counter or prescription medicines for pain, discomfort, or fever as directed by your caregiver.  Rest.  Eat a balanced diet and drink enough fluids to keep your urine clear or pale yellow.  Do not have sex until treatment is completed and you have followed up with your caregiver. STDs should be checked after treatment.  Keep all follow-up appointments, Pap tests, and blood tests as directed by your caregiver.  Only use latex condoms and water-soluble lubricants during sexual activity. Do not use petroleum jelly or oils.  Avoid alcohol and illegal drugs.  Get vaccinated for HPV and hepatitis. If you have not received these vaccines in the past, talk to your  caregiver about whether one or both might be right for you.  Avoid risky sex practices that can break the skin. The only way to avoid getting an STD is to avoid all sexual activity.Latex condoms and dental dams (for oral sex) will help lessen the risk of getting an STD, but will not  completely eliminate the risk. SEEK MEDICAL CARE IF:   You have a fever.  You have any new or worsening symptoms. Document Released: 10/11/2002 Document Revised: 10/13/2011 Document Reviewed: 10/18/2010 [facility] Patient Information 2014 [place], [place].

## 2013-03-15 NOTE — Progress Notes (Signed)
Michael Rogers is a 27 y.o. male who presents to [facility] today for STD check  Has never had STD checks before. New sexual partner, does not use condoms. Denies fevers, penile discharge, abdominal pain, dysuria, lymphadenopathy. Does not know of any STD that partner has.   Tobacco: 3-4 cig daily  The following portions of the patient's history were reviewed and updated as appropriate: allergies, current medications, past medical history, family and social history, and problem list.  Patient is a smoker.   Past Medical History  Diagnosis Date  . Asthma   . Allergy   . H. pylori infection     ROS as above otherwise neg.    Medications reviewed. Current Outpatient Prescriptions  Medication Sig Dispense Refill  . chlorpheniramine-HYDROcodone (TUSSIONEX PENNKINETIC ER) 10-8 MG/5ML LQCR Take 5 mLs by mouth every 12 (twelve) hours as needed.  40 mL  0  . ibuprofen (ADVIL,MOTRIN) 200 MG tablet Take 400 mg by mouth 2 (two) times daily as needed for pain.      Marland Kitchen Phenylephrine-DM-GG-APAP (TYLENOL COLD/FLU SEVERE PO) Take 1 Container by mouth every 2 (two) hours as needed (for cough). 1 capful      . Pseudoeph-Doxylamine-DM-APAP (NYQUIL PO) Take 1 Container by mouth at bedtime as needed (for cough). 1 capful       No current facility-administered medications for this visit.    Exam: BP 119/81  Pulse 65  Temp(Src) 98.1 F (36.7 C) (Oral)  Wt 169 lb (76.658 kg)  BMI 25.7 kg/m2 Gen: Well NAD HEENT: EOMI,  MMM GU: no discharge, skin lesions, palpable lymphnodes, testicular pain on palpation  No results found for this or any previous visit (from the past 72 hour(s)).

## 2013-03-16 LAB — HIV ANTIBODY (ROUTINE TESTING W REFLEX): HIV: NONREACTIVE

## 2013-03-16 NOTE — Assessment & Plan Note (Signed)
Unprotected sex w/ new partner (w/ unkown STD hx) but w/o symptoms HIV, RPR, Gc/Chl Precautions given adn pt counseled on safe sex.     =------------- Addendum Rpr and HIV neg

## 2013-03-30 ENCOUNTER — Telehealth: Payer: Self-pay | Admitting: Family Medicine

## 2013-03-30 NOTE — Telephone Encounter (Signed)
HIV and RPR non-reactive. GC/Chlamydia Neg.

## 2013-03-30 NOTE — Telephone Encounter (Signed)
Pt called and would like the results of his STD and HIV test. Michael Rogers

## 2013-03-30 NOTE — Telephone Encounter (Signed)
Attempted to call pt back - wrong number Michael Haste, RN-BSN

## 2013-03-30 NOTE — Telephone Encounter (Signed)
Please advise and I will be happy to call pt Wyatt Haste, RN-BSN

## 2013-04-07 ENCOUNTER — Ambulatory Visit (INDEPENDENT_AMBULATORY_CARE_PROVIDER_SITE_OTHER): Payer: Self-pay | Admitting: Family Medicine

## 2013-04-07 ENCOUNTER — Encounter: Payer: Self-pay | Admitting: Family Medicine

## 2013-04-07 VITALS — BP 118/67 | HR 69 | Temp 98.1°F | Ht 68.0 in | Wt 163.0 lb

## 2013-04-07 DIAGNOSIS — M549 Dorsalgia, unspecified: Secondary | ICD-10-CM

## 2013-04-07 DIAGNOSIS — J45909 Unspecified asthma, uncomplicated: Secondary | ICD-10-CM

## 2013-04-07 DIAGNOSIS — J4520 Mild intermittent asthma, uncomplicated: Secondary | ICD-10-CM

## 2013-04-07 DIAGNOSIS — R1013 Epigastric pain: Secondary | ICD-10-CM

## 2013-04-07 MED ORDER — PANTOPRAZOLE SODIUM 40 MG PO TBEC: 40.0000 mg | DELAYED_RELEASE_TABLET | Freq: Every day | ORAL | Status: DC

## 2013-04-07 MED ORDER — ALBUTEROL SULFATE HFA 108 (90 BASE) MCG/ACT IN AERS: 2.0000 | INHALATION_SPRAY | Freq: Four times a day (QID) | RESPIRATORY_TRACT | Status: DC | PRN

## 2013-04-07 NOTE — Assessment & Plan Note (Signed)
Non-specific back pain. Currently asymptomatic. I recommended home back exercise and tylenol prn. If no improvement will consider xray of the spine.

## 2013-04-07 NOTE — Assessment & Plan Note (Signed)
Stable Pulmonary exam benign ALbuterol prescribed prn. Tussinex not recommended.

## 2013-04-07 NOTE — Patient Instructions (Addendum)
Back Exercises  Back exercises help treat and prevent back injuries. The goal is to increase your strength in your belly (abdominal) and back muscles. These exercises can also help with flexibility. Start these exercises when told by your doctor.  HOME CARE  Back exercises include:  Pelvic Tilt.  · Lie on your back with your knees bent. Tilt your pelvis until the lower part of your back is against the floor. Hold this position 5 to 10 sec. Repeat this exercise 5 to 10 times.  Knee to Chest.  · Pull 1 knee up against your chest and hold for 20 to 30 seconds. Repeat this with the other knee. This may be done with the other leg straight or bent, whichever feels better. Then, pull both knees up against your chest.  Sit-Ups or Curl-Ups.  · Bend your knees 90 degrees. Start with tilting your pelvis, and do a partial, slow sit-up. Only lift your upper half 30 to 45 degrees off the floor. Take at least 2 to 3 seonds for each sit-up. Do not do sit-ups with your knees out straight. If partial sit-ups are difficult, simply do the above but with only tightening your belly (abdominal) muscles and holding it as told.  Hip-Lift.  · Lie on your back with your knees flexed 90 degrees. Push down with your feet and shoulders as you raise your hips 2 inches off the floor. Hold for 10 seconds, repeat 5 to 10 times.  Back Arches.  · Lie on your stomach. Prop yourself up on bent elbows. Slowly press on your hands, causing an arch in your low back. Repeat 3 to 5 times.  Shoulder-Lifts.  · Lie face down with arms beside your body. Keep hips and belly pressed to floor as you slowly lift your head and shoulders off the floor.  Do not overdo your exercises. Be careful in the beginning. Exercises may cause you some mild back discomfort. If the pain lasts for more than 15 minutes, stop the exercises until you see your doctor. Improvement with exercise for back problems is slow.   Document Released: 08/23/2010 Document Revised: 10/13/2011  Document Reviewed: 05/22/2011  [employer]® Patient Information ©2014 [employer].

## 2013-04-07 NOTE — Progress Notes (Signed)
Subjective:     Patient ID: Michael Rogers, male   DOB: 1985/09/25, 27 y.o.   MRN: [id number]  Abdominal Pain This is a recurrent problem. The current episode started more than 1 year ago (Diagnosed with h pylori and completed tripple therapy. Currently off medication). The onset quality is undetermined. The problem occurs constantly. The problem has been gradually worsening. The pain is located in the epigastric region and periumbilical region. The pain is at a severity of 10/10 (Currently asymptomatic). The quality of the pain is burning. The abdominal pain does not radiate. Associated symptoms include arthralgias. Pertinent negatives include no anorexia, belching, constipation, diarrhea, fever, frequency, hematochezia, hematuria, melena, nausea, vomiting or weight loss. Associated symptoms comments: Had diarrhea yesterday.. Exacerbated by: hot sauce. The pain is relieved by nothing. He has tried nothing (On PPI in the past which helped.) for the symptoms. H.Pylori  Back Pain The pain is present in the lumbar spine and thoracic spine (Not so specific location). The quality of the pain is described as aching. The pain does not radiate. The pain is at a severity of 3/10. The pain is mild. The pain is the same all the time. The symptoms are aggravated by position. Associated symptoms include abdominal pain. Pertinent negatives include no fever or weight loss.  Asthma There is no chest tightness, cough, difficulty breathing, shortness of breath or wheezing. This is a chronic problem. The current episode started more than 1 month ago. The problem occurs rarely. The problem has been rapidly improving. Pertinent negatives include no fever or weight loss. His symptoms are aggravated by nothing. Relieved by: He had not been using his albuterol for months. Risk factors for lung disease include smoking/tobacco exposure. His past medical history is significant for asthma and bronchitis. Past medical history comments:  He had bronchitis 6 months ago and was d/c home on Tussinex,he liked that medication and will like a refill..   Current Outpatient Prescriptions on File Prior to Visit  Medication Sig Dispense Refill  . chlorpheniramine-HYDROcodone (TUSSIONEX PENNKINETIC ER) 10-8 MG/5ML LQCR Take 5 mLs by mouth every 12 (twelve) hours as needed.  40 mL  0  . ibuprofen (ADVIL,MOTRIN) 200 MG tablet Take 400 mg by mouth 2 (two) times daily as needed for pain.      Marland Kitchen Phenylephrine-DM-GG-APAP (TYLENOL COLD/FLU SEVERE PO) Take 1 Container by mouth every 2 (two) hours as needed (for cough). 1 capful       No current facility-administered medications on file prior to visit.   Past Medical History  Diagnosis Date  . Asthma   . Allergy   . H. pylori infection      Review of Systems  Constitutional: Negative for fever and weight loss.  Respiratory: Negative for cough, shortness of breath and wheezing.   Cardiovascular: Negative.   Gastrointestinal: Positive for abdominal pain. Negative for nausea, vomiting, diarrhea, constipation, melena, hematochezia and anorexia.  Genitourinary: Negative for frequency and hematuria.  Musculoskeletal: Positive for back pain and arthralgias.  All other systems reviewed and are negative.   Filed Vitals:   04/07/13 1515  BP: 118/67  Pulse: 69  Temp: 98.1 F (36.7 C)  TempSrc: Oral  Height: 5\' 8"  (1.727 m)  Weight: 163 lb (73.936 kg)        Objective:   Physical Exam  Nursing note and vitals reviewed. Constitutional: He is oriented to person, place, and time. He appears well-developed. No distress.  Cardiovascular: Normal rate, regular rhythm, normal heart sounds and intact distal  pulses.   No murmur heard. Pulmonary/Chest: Effort normal and breath sounds normal. No respiratory distress. He has no wheezes.  Abdominal: Soft. Bowel sounds are normal. He exhibits no distension and no mass. There is no tenderness. There is no guarding.  Musculoskeletal: Normal range of  motion. He exhibits no edema.       Right shoulder: Normal.       Left shoulder: Normal.       Thoracic back: Normal.       Lumbar back: Normal.  Neurological: He is oriented to person, place, and time. He has normal strength. He displays normal reflexes. No cranial nerve deficit or sensory deficit. He displays a negative Romberg sign.       Assessment:     Abdominal pain:GERD: S/P treatment for H.Pylori Back pain: Non specific location Asthma: Intermittent     Plan:     Check problem list

## 2013-04-07 NOTE — Assessment & Plan Note (Signed)
Currently asymptomatic. Hx of treated H.pylori. Rectal exam recommended for stool hemoccult testing he declined today. Stool card to take home and return for testing. I restarted him on Protonix. F/U in 4 wks for reassessment. If no improvement will consider GI referral for endoscopy.

## 2013-05-10 ENCOUNTER — Ambulatory Visit: Payer: Self-pay | Admitting: Family Medicine

## 2013-07-14 ENCOUNTER — Ambulatory Visit: Payer: Self-pay | Admitting: Family Medicine

## 2013-07-14 ENCOUNTER — Encounter: Payer: Self-pay | Admitting: Family Medicine

## 2013-07-14 ENCOUNTER — Ambulatory Visit (INDEPENDENT_AMBULATORY_CARE_PROVIDER_SITE_OTHER): Payer: Self-pay | Admitting: Family Medicine

## 2013-07-14 VITALS — BP 116/70 | HR 63 | Temp 98.3°F | Wt 168.0 lb

## 2013-07-14 DIAGNOSIS — A048 Other specified bacterial intestinal infections: Secondary | ICD-10-CM

## 2013-07-14 DIAGNOSIS — R1013 Epigastric pain: Secondary | ICD-10-CM

## 2013-07-14 MED ORDER — PANTOPRAZOLE SODIUM 40 MG PO PACK: 40.0000 mg | PACK | Freq: Every day | ORAL | Status: DC

## 2013-07-14 MED ORDER — ONDANSETRON HCL 4 MG/5ML PO SOLN: 4.0000 mg | Freq: Once | ORAL | Status: DC

## 2013-07-14 NOTE — Patient Instructions (Signed)
We will check you for an infection today Please start the acid medicine and nausea medicine which are all syrups Your symptoms are all likely from too much acid in your stomach Please follow up with Dr. [name] in 4 weeks

## 2013-07-14 NOTE — Assessment & Plan Note (Signed)
Previous + IgG Urea Breath test today

## 2013-07-14 NOTE — Progress Notes (Signed)
Michael Rogers is a 27 y.o. male who presents to [facility] today for SD appt for stomach pains.   Stomach pain: ongoing for 4-5 years. Worse w/ spicy foods. Will flare at other random times. Comes on while laying down. Denies any abdominal surgery. Occasional nausea when pain is at its worst. Occurs about 2x wkly. Denies fevers, vomiting, diarrhea, constipation. Has had protonix in the past but puts a bad taste in pts mouth. Did not fill Rx for protonix after last appt in September.   The following portions of the patient's history were reviewed and updated as appropriate: allergies, current medications, past medical history, family and social history, and problem list.  Patient is a smoker.    Past Medical History  Diagnosis Date  . Asthma   . Allergy   . H. pylori infection     ROS as above otherwise neg.    Medications reviewed. Current Outpatient Prescriptions  Medication Sig Dispense Refill  . albuterol (PROVENTIL HFA;VENTOLIN HFA) 108 (90 BASE) MCG/ACT inhaler Inhale 2 puffs into the lungs every 6 (six) hours as needed for wheezing or shortness of breath.  18 g  3  . chlorpheniramine-HYDROcodone (TUSSIONEX PENNKINETIC ER) 10-8 MG/5ML LQCR Take 5 mLs by mouth every 12 (twelve) hours as needed.  40 mL  0  . ibuprofen (ADVIL,MOTRIN) 200 MG tablet Take 400 mg by mouth 2 (two) times daily as needed for pain.      Marland Kitchen ondansetron (ZOFRAN) 4 MG/5ML solution Take 5 mLs (4 mg total) by mouth once.  50 mL  0  . pantoprazole sodium (PROTONIX) 40 mg/20 mL PACK Take 20 mLs (40 mg total) by mouth daily. Take for 14 days  280 mL  0  . Phenylephrine-DM-GG-APAP (TYLENOL COLD/FLU SEVERE PO) Take 1 Container by mouth every 2 (two) hours as needed (for cough). 1 capful       No current facility-administered medications for this visit.    Exam:  BP 116/70  Pulse 63  Temp(Src) 98.3 F (36.8 C) (Oral)  Wt 168 lb (76.204 kg) Gen: Well NAD HEENT: EOMI,  MMM  Abd: NABS, NT, ND   No results found for  this or any previous visit (from the past 72 hour(s)).  A/P (as seen in Problem list)  H. pylori infection Previous + IgG Urea Breath test today  Abdominal pain, epigastric Likely REflux Protonix suspension Zofran Urea breath test for possible HPylori reinfection

## 2013-07-14 NOTE — Assessment & Plan Note (Signed)
Likely REflux Protonix suspension Zofran Urea breath test for possible HPylori reinfection

## 2013-08-11 ENCOUNTER — Ambulatory Visit: Payer: Self-pay

## 2013-08-18 ENCOUNTER — Ambulatory Visit: Payer: Self-pay | Admitting: Family Medicine

## 2013-09-07 ENCOUNTER — Encounter (HOSPITAL_COMMUNITY): Payer: Self-pay | Admitting: Emergency Medicine

## 2013-09-07 ENCOUNTER — Emergency Department (HOSPITAL_COMMUNITY): Payer: Self-pay

## 2013-09-07 ENCOUNTER — Emergency Department (HOSPITAL_COMMUNITY)
Admission: EM | Admit: 2013-09-07 | Discharge: 2013-09-07 | Disposition: A | Discharge status: Home / Self Care | Payer: Self-pay | Attending: Emergency Medicine | Admitting: Emergency Medicine

## 2013-09-07 DIAGNOSIS — R51 Headache: Secondary | ICD-10-CM | POA: Insufficient documentation

## 2013-09-07 DIAGNOSIS — A048 Other specified bacterial intestinal infections: Secondary | ICD-10-CM | POA: Insufficient documentation

## 2013-09-07 DIAGNOSIS — J069 Acute upper respiratory infection, unspecified: Secondary | ICD-10-CM | POA: Insufficient documentation

## 2013-09-07 DIAGNOSIS — Z87891 Personal history of nicotine dependence: Secondary | ICD-10-CM | POA: Insufficient documentation

## 2013-09-07 DIAGNOSIS — J3489 Other specified disorders of nose and nasal sinuses: Secondary | ICD-10-CM | POA: Insufficient documentation

## 2013-09-07 DIAGNOSIS — J45909 Unspecified asthma, uncomplicated: Secondary | ICD-10-CM | POA: Insufficient documentation

## 2013-09-07 MED ORDER — HYDROCODONE-HOMATROPINE 5-1.5 MG/5ML PO SYRP: 5.0000 mL | ORAL_SOLUTION | Freq: Four times a day (QID) | ORAL | Status: DC | PRN

## 2013-09-07 NOTE — ED Notes (Signed)
PA at bedside.

## 2013-09-07 NOTE — Discharge Instructions (Signed)
Take Hycodan as needed for cough. Refer to attached documents for more information. Return to the ED with worsening or concerning symptoms.

## 2013-09-07 NOTE — ED Notes (Signed)
PT comfortable with d/c and f/u instructions. Prescriptions x1 

## 2013-09-07 NOTE — ED Notes (Signed)
Pt states he has been coughing up "jelly looking stuff".  Pt states he had the same symptoms last year and was diagnosed with bronchitis.  Pt talking in complete sentences and in NAD.  Pt states he also has nasal drainage that causes him to gag.  Pt also c/o headache.

## 2013-09-07 NOTE — ED Provider Notes (Signed)
CSN: [id number]     Arrival date & time 09/07/13  1352 History   First MD Initiated Contact with Patient 09/07/13 1439    This chart was scribed for Alvina Chou PA-C, a non-physician practitioner working with Michael Kung, MD by Denice Bors, ED Scribe. This patient was seen in room TR09C/TR09C and the patient's care was started at 3:10 PM     Chief Complaint  Patient presents with  . Cough  . Headache  . Nasal Congestion   (Consider location/radiation/quality/duration/timing/severity/associated sxs/prior Treatment) The history is provided by the patient. No language interpreter was used.   HPI Comments: Michael Rogers is a 28 y.o. male who presents to the Emergency Department complaining of intermittent productive cough with one episode of "jelly red" sputum onset 4 days. Reports associated headache, clear rhinorrhea, congestion, chest pain with cough, chills, generalized myalgias, and blood streaked post-tussive emesis. Reports trying tylenol cold and sinus today, and 1 "measuring cup full" of robitussin with mild relief of symptoms. Denies any aggravating factors. Denies associated fever, insomnia, sore throat, and anorexia. Reports PMHx of asthma. States he quit smoking 1 month ago.     Past Medical History  Diagnosis Date  . Asthma   . Allergy   . H. pylori infection    History reviewed. No pertinent past surgical history. Family History  Problem Relation Age of Onset  . Kidney disease Father   . Hypertension Paternal Grandmother    History  Substance Use Topics  . Smoking status: Current Every Day Smoker    Start date: 03/17/2013  . Smokeless tobacco: Not on file  . Alcohol Use: No    Review of Systems  Constitutional: Negative for fatigue.  HENT: Positive for congestion and rhinorrhea.   Respiratory: Positive for cough.   Neurological: Positive for headaches.  Psychiatric/Behavioral: Negative for confusion.    Allergies  Review of patient's  allergies indicates no known allergies.  Home Medications  No current outpatient prescriptions on file. BP 131/82  Pulse 66  Temp(Src) 98.5 F (36.9 C) (Oral)  Resp 16  SpO2 97% Physical Exam  Nursing note and vitals reviewed. Constitutional: He is oriented to person, place, and time. He appears well-developed and well-nourished. No distress.  HENT:  Head: Normocephalic and atraumatic.  Mouth/Throat: Uvula is midline and mucous membranes are normal. No trismus in the jaw. No uvula swelling. Posterior oropharyngeal erythema present. No oropharyngeal exudate, posterior oropharyngeal edema or tonsillar abscesses.  Eyes: EOM are normal.  Neck: Neck supple. No tracheal deviation present.  Cardiovascular: Normal rate.   Pulmonary/Chest: Effort normal and breath sounds normal. No respiratory distress. He has no wheezes. He has no rales.  Musculoskeletal: Normal range of motion.  Lymphadenopathy:    He has no cervical adenopathy.  Neurological: He is alert and oriented to person, place, and time.  Skin: Skin is warm and dry.  Psychiatric: He has a normal mood and affect. His behavior is normal.    ED Course  Procedures   COORDINATION OF CARE:  Nursing notes reviewed. Vital signs reviewed. Initial pt interview and examination performed.   3:10 PM-Discussed work up plan with pt at bedside, which includes CXR. Pt agrees with plan.  3:22 PM Nursing Notes Reviewed/ Care Coordinated Applicable Imaging Reviewed and incorporated into ED treatment Discussed results and treatment plan with pt. Pt demonstrates understanding and agrees with plan.   Treatment plan initiated:Medications - No data to display   Initial diagnostic testing ordered.     Labs  Review Labs Reviewed - No data to display Imaging Review Dg Chest 2 View (if Patient Has Fever And/or Copd)  09/07/2013   CLINICAL DATA:  Cough, headache, nasal congestion, history asthma  EXAM: CHEST  2 VIEW  COMPARISON:  10/28/2012   FINDINGS: Normal heart size, mediastinal contours, and pulmonary vascularity.  Lungs well inflated and clear.  No pulmonary infiltrate, pleural effusion or pneumothorax.  Bones unremarkable.  IMPRESSION: No acute abnormalities.   Electronically Signed   By: Lavonia Dana M.D.   On: 09/07/2013 15:01    EKG Interpretation   None       MDM   1. URI (upper respiratory infection)    Patient likely has URI. Patient will be discharged with hycodan for cough. Vitals stable and patient afebrile. Patient advised to return with worsening or concerning symptoms. Patient is PERC negative.   I personally performed the services described in this documentation, which was scribed in my presence. The recorded information has been reviewed and is accurate.    Alvina Chou, [place] 09/09/13 1943

## 2013-09-07 NOTE — ED Notes (Signed)
Pt returned from X-ray.  

## 2013-09-08 ENCOUNTER — Encounter (HOSPITAL_COMMUNITY): Payer: Self-pay | Admitting: Emergency Medicine

## 2013-09-08 ENCOUNTER — Emergency Department (HOSPITAL_COMMUNITY)
Admission: EM | Admit: 2013-09-08 | Discharge: 2013-09-08 | Disposition: A | Discharge status: Home / Self Care | Payer: Self-pay | Attending: Emergency Medicine | Admitting: Emergency Medicine

## 2013-09-08 ENCOUNTER — Emergency Department (HOSPITAL_COMMUNITY)
Admission: EM | Admit: 2013-09-08 | Discharge: 2013-09-08 | Discharge status: Against Medical Advice | Payer: Self-pay | Attending: Emergency Medicine | Admitting: Emergency Medicine

## 2013-09-08 DIAGNOSIS — Z8619 Personal history of other infectious and parasitic diseases: Secondary | ICD-10-CM | POA: Insufficient documentation

## 2013-09-08 DIAGNOSIS — R34 Anuria and oliguria: Secondary | ICD-10-CM | POA: Insufficient documentation

## 2013-09-08 DIAGNOSIS — Z87891 Personal history of nicotine dependence: Secondary | ICD-10-CM | POA: Insufficient documentation

## 2013-09-08 DIAGNOSIS — J111 Influenza due to unidentified influenza virus with other respiratory manifestations: Secondary | ICD-10-CM | POA: Insufficient documentation

## 2013-09-08 DIAGNOSIS — R1110 Vomiting, unspecified: Secondary | ICD-10-CM | POA: Insufficient documentation

## 2013-09-08 DIAGNOSIS — J45909 Unspecified asthma, uncomplicated: Secondary | ICD-10-CM | POA: Insufficient documentation

## 2013-09-08 DIAGNOSIS — R51 Headache: Secondary | ICD-10-CM | POA: Insufficient documentation

## 2013-09-08 DIAGNOSIS — J069 Acute upper respiratory infection, unspecified: Secondary | ICD-10-CM | POA: Insufficient documentation

## 2013-09-08 DIAGNOSIS — R52 Pain, unspecified: Secondary | ICD-10-CM | POA: Insufficient documentation

## 2013-09-08 MED ORDER — IBUPROFEN 800 MG PO TABS
800.0000 mg | ORAL_TABLET | Freq: Once | ORAL | Status: AC
  Administered 2013-09-08: 800 mg via ORAL
  Filled 2013-09-08: qty 1

## 2013-09-08 MED ORDER — OSELTAMIVIR PHOSPHATE 75 MG PO CAPS: 75.0000 mg | ORAL_CAPSULE | Freq: Two times a day (BID) | ORAL | Status: DC

## 2013-09-08 MED ORDER — ONDANSETRON 4 MG PO TBDP
8.0000 mg | ORAL_TABLET | Freq: Once | ORAL | Status: AC
  Administered 2013-09-08: 8 mg via ORAL
  Filled 2013-09-08: qty 2

## 2013-09-08 MED ORDER — ONDANSETRON 8 MG PO TBDP
8.0000 mg | ORAL_TABLET | Freq: Once | ORAL | Status: DC
Start: 2013-09-08 — End: 2013-09-08

## 2013-09-08 NOTE — ED Notes (Signed)
Pt was tx here for uri yesterday.  He came back today because he "can't eat anything b/c he's nauseated" and he has a fever/chills.  VS stable.  Temp 99.5.

## 2013-09-08 NOTE — Discharge Instructions (Signed)

## 2013-09-08 NOTE — ED Provider Notes (Signed)
CSN: [id number]     Arrival date & time 09/08/13  2152 History  This chart was scribed for Barbette Hair, PA-C, non-physician practitioner working with Richarda Blade, MD by Vernell Barrier, ED scribe. This patient was seen in room WTR1/WLPT1 and the patient's care was started at 10: 23 PM.  Chief Complaint  Patient presents with  . Influenza   The history is provided by the patient. No language interpreter was used.    HPI Comments: Michael Rogers is a 28 y.o. male who presents to the Emergency Department complaining of fever, cough, HA, and generalized myalgias, onset 2-3 days ago. He did have one episode of vomiting on the first day of symptom on set but has not since. Went to ER yesterday and has x-ray performed. Pt was told he had a URI and to come back in symptoms persists. He went back to the emergency room at [facility] today but did not wish to wait to be seen and came here. States he has fever of 102 upon discharge. Was given Codeine in the ER which he reports helped him sleep last night. Took motrin 10 hours ago for HA with no relief. Denies any continued vomiting or diarrhea. Has been drinking lots of fluids. Decreased appetite. 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 No recent travel.   Past Medical History  Diagnosis Date  . Asthma   . Allergy   . H. pylori infection    History reviewed. No pertinent past surgical history. Family History  Problem Relation Age of Onset  . Kidney disease Father   . Hypertension Paternal Grandmother    History  Substance Use Topics  . Smoking status: Former Smoker    Start date: 03/17/2013  . Smokeless tobacco: Not on file  . Alcohol Use: No    Review of Systems  Constitutional: Positive for fever and chills.  Respiratory: Positive for cough.   Genitourinary: Positive for decreased urine volume.  Musculoskeletal: Positive for myalgias.  Neurological: Positive for headaches.   Allergies  Review of  patient's allergies indicates no known allergies.  Home Medications   Current Outpatient Rx  Name  Route  Sig  Dispense  Refill  . HYDROcodone-homatropine (HYCODAN) 5-1.5 MG/5ML syrup   Oral   Take 5 mLs by mouth every 6 (six) hours as needed for cough.   120 mL   0    Triage Vitals: BP 148/115  Pulse 95  Temp(Src) 102.8 F (39.3 C) (Oral)  Resp 18  SpO2 96%  Physical Exam  Nursing note and vitals reviewed. Constitutional: He is oriented to person, place, and time. He appears well-developed and well-nourished.  HENT:  Head: Normocephalic and atraumatic.  Mouth/Throat: Oropharynx is clear and moist.  Eyes: Conjunctivae and EOM are normal.  Neck: Normal range of motion.  Cardiovascular: Normal rate and normal heart sounds.   Pulmonary/Chest: Effort normal and breath sounds normal. No respiratory distress. He has no wheezes. He has no rales.  Musculoskeletal: Normal range of motion. He exhibits no tenderness.  Neurological: He is oriented to person, place, and time. He has normal reflexes.  Skin: Skin is warm and dry.  Psychiatric: He has a normal mood and affect. His behavior is normal.   ED Course  Procedures  DIAGNOSTIC STUDIES: Oxygen Saturation is 96% on room air, adequate by my interpretation.    COORDINATION OF CARE: At 10:29 PM: Discussed treatment plan with patient which includes Tamiflu to help relieve symptoms. Pt  is advised to continue taking motrin and tylenol to help keep fever down. Patient agrees.   Imaging Review Dg Chest 2 View (if Patient Has Fever And/or Copd)  09/07/2013   CLINICAL DATA:  Cough, headache, nasal congestion, history asthma  EXAM: CHEST  2 VIEW  COMPARISON:  10/28/2012  FINDINGS: Normal heart size, mediastinal contours, and pulmonary vascularity.  Lungs well inflated and clear.  No pulmonary infiltrate, pleural effusion or pneumothorax.  Bones unremarkable.  IMPRESSION: No acute abnormalities.   Electronically Signed   By: Lavonia Dana M.D.    On: 09/07/2013 15:01   MDM   1. Influenza     I personally performed the services described in this documentation, which was scribed in my presence. The recorded information has been reviewed and is accurate.      Martie Lee, PA-C 09/08/13 2251

## 2013-09-08 NOTE — ED Notes (Signed)
Did not answer when called- at [facility]

## 2013-09-08 NOTE — ED Notes (Signed)
Pt states he went to [facility] last night and had a chest xray and was told it was clean and was diagnosed with a URI  Pt states he has a cough, fever, decreased appetite, nausea, and headache  Pt states his sxs started 3 days ago

## 2013-09-09 NOTE — ED Provider Notes (Signed)
Medical screening examination/treatment/procedure(s) were performed by non-physician practitioner and as supervising physician I was immediately available for consultation/collaboration.  Richarda Blade, MD 09/09/13 [phone]

## 2013-09-13 ENCOUNTER — Telehealth: Payer: Self-pay | Admitting: *Deleted

## 2013-09-13 NOTE — ED Provider Notes (Signed)
Medical screening examination/treatment/procedure(s) were performed by non-physician practitioner and as supervising physician I was immediately available for consultation/collaboration.  EKG Interpretation   None         Mervin Kung, MD 09/13/13 1314

## 2013-09-13 NOTE — Telephone Encounter (Signed)
Called patient and scheduled hospital f/u for 10/04/2013 with PCP.   Michael Rogers, Michael Rogers, [name]

## 2013-10-04 ENCOUNTER — Ambulatory Visit: Payer: Self-pay | Admitting: Family Medicine

## 2013-10-18 ENCOUNTER — Ambulatory Visit: Payer: Self-pay | Admitting: Family Medicine

## 2014-01-21 ENCOUNTER — Emergency Department (HOSPITAL_COMMUNITY)
Admission: EM | Admit: 2014-01-21 | Discharge: 2014-01-21 | Disposition: A | Discharge status: Home / Self Care | Payer: Self-pay | Attending: Emergency Medicine | Admitting: Emergency Medicine

## 2014-01-21 ENCOUNTER — Encounter (HOSPITAL_COMMUNITY): Payer: Self-pay | Admitting: Emergency Medicine

## 2014-01-21 ENCOUNTER — Emergency Department (HOSPITAL_COMMUNITY): Payer: Self-pay

## 2014-01-21 DIAGNOSIS — Y9389 Activity, other specified: Secondary | ICD-10-CM | POA: Insufficient documentation

## 2014-01-21 DIAGNOSIS — S93402A Sprain of unspecified ligament of left ankle, initial encounter: Secondary | ICD-10-CM

## 2014-01-21 DIAGNOSIS — IMO0002 Reserved for concepts with insufficient information to code with codable children: Secondary | ICD-10-CM | POA: Insufficient documentation

## 2014-01-21 DIAGNOSIS — T07XXXA Unspecified multiple injuries, initial encounter: Secondary | ICD-10-CM

## 2014-01-21 DIAGNOSIS — Z23 Encounter for immunization: Secondary | ICD-10-CM | POA: Insufficient documentation

## 2014-01-21 DIAGNOSIS — Z79899 Other long term (current) drug therapy: Secondary | ICD-10-CM | POA: Insufficient documentation

## 2014-01-21 DIAGNOSIS — Z87891 Personal history of nicotine dependence: Secondary | ICD-10-CM | POA: Insufficient documentation

## 2014-01-21 DIAGNOSIS — Y92410 Unspecified street and highway as the place of occurrence of the external cause: Secondary | ICD-10-CM | POA: Insufficient documentation

## 2014-01-21 DIAGNOSIS — J45909 Unspecified asthma, uncomplicated: Secondary | ICD-10-CM | POA: Insufficient documentation

## 2014-01-21 DIAGNOSIS — S93409A Sprain of unspecified ligament of unspecified ankle, initial encounter: Secondary | ICD-10-CM | POA: Insufficient documentation

## 2014-01-21 DIAGNOSIS — Z8619 Personal history of other infectious and parasitic diseases: Secondary | ICD-10-CM | POA: Insufficient documentation

## 2014-01-21 MED ORDER — TETANUS-DIPHTH-ACELL PERTUSSIS 5-2.5-18.5 LF-MCG/0.5 IM SUSP
0.5000 mL | Freq: Once | INTRAMUSCULAR | Status: AC
  Administered 2014-01-21: 0.5 mL via INTRAMUSCULAR
  Filled 2014-01-21: qty 0.5

## 2014-01-21 MED ORDER — TRAMADOL HCL 50 MG PO TABS
50.0000 mg | ORAL_TABLET | Freq: Four times a day (QID) | ORAL | Status: DC | PRN
Start: 2014-01-21 — End: 2014-04-18

## 2014-01-21 MED ORDER — HYDROCODONE-ACETAMINOPHEN 5-325 MG PO TABS
2.0000 | ORAL_TABLET | Freq: Once | ORAL | Status: AC
  Administered 2014-01-21: 2 via ORAL
  Filled 2014-01-21: qty 2
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
## 2014-01-21 NOTE — Discharge Instructions (Signed)
Wear ankle support as need for comfort/support for the next few days. Ice/coldpack to sore area. Keep abrasions clean/dry.  You may apply a very thin coat of bacitracin for the next few days. Take motrin or aleve as need for pain. You may also take ultram as need for pain - no driving when taking ultram. Follow up with primary care doctor in 1-2 weeks if symptoms fail to improve/resolve. Return to ER if worse, new symptoms, severe pain, other concern.  You were given pain medication in the ER - no driving for the next 4 hours.    Ankle Sprain An ankle sprain is an injury to the strong, fibrous tissues (ligaments) that hold the bones of your ankle joint together.  CAUSES An ankle sprain is usually caused by a fall or by twisting your ankle. Ankle sprains most commonly occur when you step on the outer edge of your foot, and your ankle turns inward. People who participate in sports are more prone to these types of injuries.  SYMPTOMS   Pain in your ankle. The pain may be present at rest or only when you are trying to stand or walk.  Swelling.  Bruising. Bruising may develop immediately or within 1 to 2 days after your injury.  Difficulty standing or walking, particularly when turning corners or changing directions. DIAGNOSIS  Your caregiver will ask you details about your injury and perform a physical exam of your ankle to determine if you have an ankle sprain. During the physical exam, your caregiver will press on and apply pressure to specific areas of your foot and ankle. Your caregiver will try to move your ankle in certain ways. An X-ray exam may be done to be sure a bone was not broken or a ligament did not separate from one of the bones in your ankle (avulsion fracture).  TREATMENT  Certain types of braces can help stabilize your ankle. Your caregiver can make a recommendation for this. Your caregiver may recommend the use of medicine for pain. If your sprain is severe, your caregiver  may refer you to a surgeon who helps to restore function to parts of your skeletal system (orthopedist) or a physical therapist. [place] ice to your injury for 1-2 days or as directed by your caregiver. Applying ice helps to reduce inflammation and pain.  Put ice in a plastic bag.  Place a towel between your skin and the bag.  Leave the ice on for 15-20 minutes at a time, every 2 hours while you are awake.  Only take over-the-counter or prescription medicines for pain, discomfort, or fever as directed by your caregiver.  Elevate your injured ankle above the level of your heart as much as possible for 2-3 days.  If your caregiver recommends crutches, use them as instructed. Gradually put weight on the affected ankle. Continue to use crutches or a cane until you can walk without feeling pain in your ankle.  If you have a plaster splint, wear the splint as directed by your caregiver. Do not rest it on anything harder than a pillow for the first 24 hours. Do not put weight on it. Do not get it wet. You may take it off to take a shower or bath.  You may have been given an elastic bandage to wear around your ankle to provide support. If the elastic bandage is too tight (you have numbness or tingling in your foot or your foot becomes cold and blue), adjust the  bandage to make it comfortable.  If you have an air splint, you may blow more air into it or let air out to make it more comfortable. You may take your splint off at night and before taking a shower or bath. Wiggle your toes in the splint several times per day to decrease swelling. SEEK MEDICAL CARE IF:   You have rapidly increasing bruising or swelling.  Your toes feel extremely cold or you lose feeling in your foot.  Your pain is not relieved with medicine. SEEK IMMEDIATE MEDICAL CARE IF:  Your toes are numb or blue.  You have severe pain that is increasing. MAKE SURE YOU:   Understand these  instructions.  Will watch your condition.  Will get help right away if you are not doing well or get worse. Document Released: 07/21/2005 Document Revised: 04/14/2012 Document Reviewed: 08/02/2011 [facility] Patient Information 2015 [place], [place]. This information is not intended to replace advice given to you by your health care provider. Make sure you discuss any questions you have with your health care provider.     Abrasion An abrasion is a cut or scrape of the skin. Abrasions do not extend through all layers of the skin and most heal within 10 days. It is important to care for your abrasion properly to prevent infection. CAUSES  Most abrasions are caused by falling on, or gliding across, the ground or other surface. When your skin rubs on something, the outer and inner layer of skin rubs off, causing an abrasion. DIAGNOSIS  Your caregiver will be able to diagnose an abrasion during a physical exam.  TREATMENT  Your treatment depends on how large and deep the abrasion is. Generally, your abrasion will be cleaned with water and a mild soap to remove any dirt or debris. An antibiotic ointment may be put over the abrasion to prevent an infection. A bandage (dressing) may be wrapped around the abrasion to keep it from getting dirty.  You may need a tetanus shot if:  You cannot remember when you had your last tetanus shot.  You have never had a tetanus shot.  The injury broke your skin. If you get a tetanus shot, your arm may swell, get red, and feel warm to the touch. This is common and not a problem. If you need a tetanus shot and you choose not to have one, there is a rare chance of getting tetanus. Sickness from tetanus can be serious.  HOME CARE INSTRUCTIONS   If a dressing was applied, change it at least once a day or as directed by your caregiver. If the bandage sticks, soak it off with warm water.   Wash the area with water and a mild soap to remove all the ointment 2 times a  day. Rinse off the soap and pat the area dry with a clean towel.   Reapply any ointment as directed by your caregiver. This will help prevent infection and keep the bandage from sticking. Use gauze over the wound and under the dressing to help keep the bandage from sticking.   Change your dressing right away if it becomes wet or dirty.   Only take over-the-counter or prescription medicines for pain, discomfort, or fever as directed by your caregiver.   Follow up with your caregiver within 24-48 hours for a wound check, or as directed. If you were not given a wound-check appointment, look closely at your abrasion for redness, swelling, or pus. These are signs of infection. [place]  CARE IF:   You have increasing pain in the wound.   You have redness, swelling, or tenderness around the wound.   You have pus coming from the wound.   You have a fever or persistent symptoms for more than 2-3 days.  You have a fever and your symptoms suddenly get worse.  You have a bad smell coming from the wound or dressing.  MAKE SURE YOU:   Understand these instructions.  Will watch your condition.  Will get help right away if you are not doing well or get worse. Document Released: 04/30/2005 Document Revised: 07/07/2012 Document Reviewed: 06/24/2011 [facility] Patient Information 2015 [place], [place]. This information is not intended to replace advice given to you by your health care provider. Make sure you discuss any questions you have with your health care provider.    Contusion A contusion is a deep bruise. Contusions are the result of an injury that caused bleeding under the skin. The contusion may turn blue, purple, or yellow. Minor injuries will give you a painless contusion, but more severe contusions may stay painful and swollen for a few weeks.  CAUSES  A contusion is usually caused by a blow, trauma, or direct force to an area of the body. SYMPTOMS   Swelling and  redness of the injured area.  Bruising of the injured area.  Tenderness and soreness of the injured area.  Pain. DIAGNOSIS  The diagnosis can be made by taking a history and physical exam. An X-ray, CT scan, or MRI may be needed to determine if there were any associated injuries, such as fractures. TREATMENT  Specific treatment will depend on what area of the body was injured. In general, the best treatment for a contusion is resting, icing, elevating, and applying cold compresses to the injured area. Over-the-counter medicines may also be recommended for pain control. Ask your caregiver what the best treatment is for your contusion. HOME CARE INSTRUCTIONS   Put ice on the injured area.  Put ice in a plastic bag.  Place a towel between your skin and the bag.  Leave the ice on for 15-20 minutes, 3-4 times a day, or as directed by your health care provider.  Only take over-the-counter or prescription medicines for pain, discomfort, or fever as directed by your caregiver. Your caregiver may recommend avoiding anti-inflammatory medicines (aspirin, ibuprofen, and naproxen) for 48 hours because these medicines may increase bruising.  Rest the injured area.  If possible, elevate the injured area to reduce swelling. SEEK IMMEDIATE MEDICAL CARE IF:   You have increased bruising or swelling.  You have pain that is getting worse.  Your swelling or pain is not relieved with medicines. MAKE SURE YOU:   Understand these instructions.  Will watch your condition.  Will get help right away if you are not doing well or get worse. Document Released: 04/30/2005 Document Revised: 07/26/2013 Document Reviewed: 05/26/2011 [facility] Patient Information 2015 [place], [place]. This information is not intended to replace advice given to you by your health care provider. Make sure you discuss any questions you have with your health care provider.   Cryotherapy Cryotherapy means treatment with cold.  Ice or gel packs can be used to reduce both pain and swelling. Ice is the most helpful within the first 24 to 48 hours after an injury or flareup from overusing a muscle or joint. Sprains, strains, spasms, burning pain, shooting pain, and aches can all be eased with ice. Ice can also be used when recovering from  surgery. Ice is effective, has very few side effects, and is safe for most people to use. PRECAUTIONS  Ice is not a safe treatment option for people with:  Raynaud's phenomenon. This is a condition affecting small blood vessels in the extremities. Exposure to cold may cause your problems to return.  Cold hypersensitivity. There are many forms of cold hypersensitivity, including:  Cold urticaria. Red, itchy hives appear on the skin when the tissues begin to warm after being iced.  Cold erythema. This is a red, itchy rash caused by exposure to cold.  Cold hemoglobinuria. Red blood cells break down when the tissues begin to warm after being iced. The hemoglobin that carry oxygen are passed into the urine because they cannot combine with blood proteins fast enough.  Numbness or altered sensitivity in the area being iced. If you have any of the following conditions, do not use ice until you have discussed cryotherapy with your caregiver:  Heart conditions, such as arrhythmia, angina, or chronic heart disease.  High blood pressure.  Healing wounds or open skin in the area being iced.  Current infections.  Rheumatoid arthritis.  Poor circulation.  Diabetes. Ice slows the blood flow in the region it is applied. This is beneficial when trying to stop inflamed tissues from spreading irritating chemicals to surrounding tissues. However, if you expose your skin to cold temperatures for too long or without the proper protection, you can damage your skin or nerves. Watch for signs of skin damage due to cold. HOME CARE INSTRUCTIONS Follow these tips to use ice and cold packs safely.  Place  a dry or damp towel between the ice and skin. A damp towel will cool the skin more quickly, so you may need to shorten the time that the ice is used.  For a more rapid response, add gentle compression to the ice.  Ice for no more than 10 to 20 minutes at a time. The bonier the area you are icing, the less time it will take to get the benefits of ice.  Check your skin after 5 minutes to make sure there are no signs of a poor response to cold or skin damage.  Rest 20 minutes or more in between uses.  Once your skin is numb, you can end your treatment. You can test numbness by very lightly touching your skin. The touch should be so light that you do not see the skin dimple from the pressure of your fingertip. When using ice, most people will feel these normal sensations in this order: cold, burning, aching, and numbness.  Do not use ice on someone who cannot communicate their responses to pain, such as small children or people with dementia. HOW TO MAKE AN ICE PACK Ice packs are the most common way to use ice therapy. Other methods include ice massage, ice baths, and cryo-sprays. Muscle creams that cause a cold, tingly feeling do not offer the same benefits that ice offers and should not be used as a substitute unless recommended by your caregiver. To make an ice pack, do one of the following:  Place crushed ice or a bag of frozen vegetables in a sealable plastic bag. Squeeze out the excess air. Place this bag inside another plastic bag. Slide the bag into a pillowcase or place a damp towel between your skin and the bag.  Mix 3 parts water with 1 part rubbing alcohol. Freeze the mixture in a sealable plastic bag. When you remove the mixture from the freezer,  it will be slushy. Squeeze out the excess air. Place this bag inside another plastic bag. Slide the bag into a pillowcase or place a damp towel between your skin and the bag. SEEK MEDICAL CARE IF:  You develop white spots on your skin. This  may give the skin a blotchy (mottled) appearance.  Your skin turns blue or pale.  Your skin becomes waxy or hard.  Your swelling gets worse. MAKE SURE YOU:   Understand these instructions.  Will watch your condition.  Will get help right away if you are not doing well or get worse. Document Released: 03/17/2011 Document Revised: 10/13/2011 Document Reviewed: 03/17/2011 [facility] Patient Information 2015 [place], [place]. This information is not intended to replace advice given to you by your health care provider. Make sure you discuss any questions you have with your health care provider.

## 2014-01-25 ENCOUNTER — Encounter: Payer: Self-pay | Admitting: Family Medicine

## 2014-01-25 ENCOUNTER — Ambulatory Visit (INDEPENDENT_AMBULATORY_CARE_PROVIDER_SITE_OTHER): Payer: Self-pay | Admitting: Family Medicine

## 2014-01-25 VITALS — BP 110/70 | HR 58 | Temp 98.1°F | Resp 16 | Wt 181.0 lb

## 2014-01-25 DIAGNOSIS — S93409A Sprain of unspecified ligament of unspecified ankle, initial encounter: Secondary | ICD-10-CM | POA: Insufficient documentation

## 2014-01-25 MED ORDER — IBUPROFEN 600 MG PO TABS: 600.0000 mg | ORAL_TABLET | Freq: Three times a day (TID) | ORAL | Status: DC | PRN

## 2014-01-25 NOTE — Assessment & Plan Note (Signed)
Moderate sprain without evidence of fracture or infection.  Discused prognosis and rehab.
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
## 2014-01-25 NOTE — Patient Instructions (Signed)
Good to see you today!  Thanks for coming in.  You have a ankle sprain on the left.  These usually take 4-6 weeks to fully heal  Take ibuprofen and tramadol as needed and elevate and ice as often you can and wear the brace when up walking  After 1 week start range of motion Alphabet exercises twice daily  If you are not healing as you think you should come back or if it is getting a lot worse

## 2014-04-18 ENCOUNTER — Emergency Department (HOSPITAL_COMMUNITY)
Admission: EM | Admit: 2014-04-18 | Discharge: 2014-04-18 | Disposition: A | Discharge status: Home / Self Care | Payer: Self-pay | Attending: Emergency Medicine | Admitting: Emergency Medicine

## 2014-04-18 ENCOUNTER — Encounter (HOSPITAL_COMMUNITY): Payer: Self-pay | Admitting: Emergency Medicine

## 2014-04-18 DIAGNOSIS — R51 Headache: Secondary | ICD-10-CM | POA: Insufficient documentation

## 2014-04-18 DIAGNOSIS — Z87891 Personal history of nicotine dependence: Secondary | ICD-10-CM | POA: Insufficient documentation

## 2014-04-18 DIAGNOSIS — Z8619 Personal history of other infectious and parasitic diseases: Secondary | ICD-10-CM | POA: Insufficient documentation

## 2014-04-18 DIAGNOSIS — J45909 Unspecified asthma, uncomplicated: Secondary | ICD-10-CM | POA: Insufficient documentation

## 2014-04-18 DIAGNOSIS — J0110 Acute frontal sinusitis, unspecified: Secondary | ICD-10-CM | POA: Insufficient documentation

## 2014-04-18 MED ORDER — GUAIFENESIN 100 MG/5ML PO SYRP: 100.0000 mg | ORAL_SOLUTION | ORAL | Status: DC | PRN

## 2014-04-18 MED ORDER — AZITHROMYCIN 250 MG PO TABS: 250.0000 mg | ORAL_TABLET | Freq: Every day | ORAL | Status: DC

## 2014-04-18 NOTE — ED Notes (Signed)
Pt reports productive cough and sinus pain increasing over last three weeks

## 2014-04-18 NOTE — ED Provider Notes (Signed)
Medical screening examination/treatment/procedure(s) were performed by non-physician practitioner and as supervising physician I was immediately available for consultation/collaboration.  Leota Jacobsen, MD 04/18/14 [id number]

## 2014-04-18 NOTE — ED Provider Notes (Signed)
CSN: [id number]     Arrival date & time 04/18/14  1344 History   This chart was scribed for non-physician practitioner, Linus Mako, PA-C, working with Leota Jacobsen, MD, by Delphia Grates, ED Scribe. This patient was seen in room WTR6/WTR6 and the patient's care was started at 2:15 PM.    Chief Complaint  Patient presents with  . Facial Pain    pain in face x 3 days    The history is provided by the patient. No language interpreter was used.    HPI Comments: Michael Rogers is a 28 y.o. male who presents to the Emergency Department complaining of gradually worsening sinus pressure onset 3 weeks ago. Patient states he began to experience congestion with productive cough approximately 3 weeks ago. He reports taking a trip to [facility] a week after onset of symptoms, but states they have gotten worse in the last few days. He reports, yesterday, he began to feel "woozy". There is associated "scratchy" throat, congestion, diaphoresis (night sweats), and nausea. He states he was diagnosed with "walking pneumonia" in the past. He denies fever, chills, diarrhea, or vomiting. Patient is a former smoker (quit 1 year ago), but does smoke marijuana on occasion. NKA to ABX.  Past Medical History  Diagnosis Date  . Asthma   . Allergy   . H. pylori infection    History reviewed. No pertinent past surgical history. Family History  Problem Relation Age of Onset  . Kidney disease Father   . Hypertension Paternal Grandmother    History  Substance Use Topics  . Smoking status: Former Smoker    Start date: 03/17/2013  . Smokeless tobacco: Not on file  . Alcohol Use: No    Review of Systems  Constitutional: Positive for diaphoresis. Negative for fever and chills.  HENT: Positive for congestion and sinus pressure.   Respiratory: Positive for cough.   Gastrointestinal: Positive for nausea. Negative for vomiting and diarrhea.  All other systems reviewed and are  negative.     Allergies  Other  Home Medications   Prior to Admission medications   Medication Sig Start Date End Date Taking? Authorizing Provider  azithromycin (ZITHROMAX) 250 MG tablet Take 1 tablet (250 mg total) by mouth daily. Take first 2 tablets together, then 1 every day until finished. 04/18/14   Ferry Matthis Marilu Favre, PA-C  guaifenesin (ROBITUSSIN) 100 MG/5ML syrup Take 5-10 mLs (100-200 mg total) by mouth every 4 (four) hours as needed for cough. 04/18/14   Sharise Lippy Marilu Favre, PA-C  ibuprofen (ADVIL,MOTRIN) 600 MG tablet Take 1 tablet (600 mg total) by mouth every 8 (eight) hours as needed. 01/25/14   Lind Covert, MD  traMADol (ULTRAM) 50 MG tablet Take 1 tablet (50 mg total) by mouth every 6 (six) hours as needed. 01/21/14   Mirna Mires, MD   Triage Vitals: BP 110/64  Pulse 77  Temp(Src) 98.7 F (37.1 C) (Oral)  Resp 18  SpO2 96%  Physical Exam  Nursing note and vitals reviewed. Constitutional: He is oriented to person, place, and time. He appears well-developed and well-nourished. No distress.  HENT:  Head: Normocephalic and atraumatic.  Nose: Right sinus exhibits maxillary sinus tenderness and frontal sinus tenderness. Left sinus exhibits maxillary sinus tenderness and frontal sinus tenderness.  Mouth/Throat: Uvula is midline, oropharynx is clear and moist and mucous membranes are normal.  Eyes: Conjunctivae and EOM are normal.  Neck: Neck supple. No tracheal deviation present.  Cardiovascular: Normal rate.  Pulmonary/Chest: Effort normal and breath sounds normal. No respiratory distress. He has no decreased breath sounds. He has no wheezes. He has no rhonchi. He has no rales.  Musculoskeletal: Normal range of motion.  Neurological: He is alert and oriented to person, place, and time.  Skin: Skin is warm and dry.  Psychiatric: He has a normal mood and affect. His behavior is normal.    ED Course  Procedures (including critical care time)  DIAGNOSTIC  STUDIES: Oxygen Saturation is 96% on room air, adequate by my interpretation.    COORDINATION OF CARE: At 16 Discussed treatment plan with patient which includes ABX and cough syrup. Patient agrees.   Labs Review Labs Reviewed - No data to display  Imaging Review No results found.   EKG Interpretation None      MDM   Final diagnoses:  Acute frontal sinusitis, recurrence not specified    28 y.o.Michael Rogers's evaluation in the Emergency Department is complete. It has been determined that no acute conditions requiring further emergency intervention are present at this time. The patient/guardian have been advised of the diagnosis and plan. We have discussed signs and symptoms that warrant return to the ED, such as changes or worsening in symptoms.  Vital signs are stable at discharge. Filed Vitals:   04/18/14 1423  BP: 110/64  Pulse: 77  Temp: 98.7 F (37.1 C)  Resp: 18    Patient/guardian has voiced understanding and agreed to follow-up with the PCP or specialist.  I personally performed the services described in this documentation, which was scribed in my presence. The recorded information has been reviewed and is accurate.   Linus Mako, PA-C 04/18/14 1437

## 2014-04-18 NOTE — Discharge Instructions (Signed)

## 2014-04-19 ENCOUNTER — Ambulatory Visit (INDEPENDENT_AMBULATORY_CARE_PROVIDER_SITE_OTHER): Payer: Self-pay | Admitting: Family Medicine

## 2014-04-19 ENCOUNTER — Encounter: Payer: Self-pay | Admitting: Family Medicine

## 2014-04-19 VITALS — BP 129/85 | HR 89 | Temp 98.2°F | Wt 188.0 lb

## 2014-04-19 DIAGNOSIS — J45909 Unspecified asthma, uncomplicated: Secondary | ICD-10-CM

## 2014-04-19 DIAGNOSIS — J309 Allergic rhinitis, unspecified: Secondary | ICD-10-CM

## 2014-04-19 DIAGNOSIS — J4520 Mild intermittent asthma, uncomplicated: Secondary | ICD-10-CM

## 2014-04-19 DIAGNOSIS — J069 Acute upper respiratory infection, unspecified: Secondary | ICD-10-CM | POA: Insufficient documentation

## 2014-04-19 DIAGNOSIS — J302 Other seasonal allergic rhinitis: Secondary | ICD-10-CM

## 2014-04-19 MED ORDER — ALBUTEROL SULFATE HFA 108 (90 BASE) MCG/ACT IN AERS: 2.0000 | INHALATION_SPRAY | Freq: Four times a day (QID) | RESPIRATORY_TRACT | Status: DC | PRN

## 2014-04-19 MED ORDER — CETIRIZINE HCL 10 MG PO TABS: 10.0000 mg | ORAL_TABLET | Freq: Every day | ORAL | Status: DC

## 2014-04-19 MED ORDER — BENZONATATE 100 MG PO CAPS: 100.0000 mg | ORAL_CAPSULE | Freq: Two times a day (BID) | ORAL | Status: DC | PRN

## 2014-04-19 NOTE — Assessment & Plan Note (Signed)
Advised to restart taking daily allergy medication as he does endorse significant allergy symptoms Plan: zyrtec daily, can switch to allegra or other antihistamine as needed. Patient declines flonase as he felt it made him worse when he took it.

## 2014-04-19 NOTE — Assessment & Plan Note (Signed)
Lung exam clear, do not feel this is an asthma exacerbation. Plan: refilled albuterol so that he can have on hand if needed in the future.

## 2014-04-19 NOTE — Assessment & Plan Note (Addendum)
Consistent with viral URI, may be having secondary bacterial infection but already prescribed abx, I advised him I did not feel he needed to take the antibiotic. Patient requested tussionex by name as this was prescribed to him last year in the ED for cough. Discussed cough may persist up to a month or sometimes longer Plan: tessalon perles, OTC cough remedies, benadryl to help sleep at night. F/u as needed.

## 2014-04-19 NOTE — Patient Instructions (Signed)
  Drink lots of water. Guaifenesin and Dextromorphan. Benadryl or Doxylamine for helping with sleep. These are all over the counter. Nasal Saline Spray Congestion:  Nose spray: Afrin (Phenylephrine). DO NOT USE MORE THAN 3 DAYS Oral: Pseudoephedrine Sneezing & Runny nose: Antihistamines: Zyrtec, Claritin, Allegra Pain/Sore throat: Tylenol, Ibuprofen Cough: Dextromethorphan & Albuterol Wash your hands often to prevent spreading the virus  Follow up in clinic if not improved in the next 2 weeks

## 2014-04-19 NOTE — Progress Notes (Signed)
Patient ID: Michael Rogers, male   DOB: 08-30-1985, 28 y.o.   MRN: [id number]   Subjective:    Patient ID: Michael Rogers, male    DOB: [birth year], 28 y.o.   MRN: [id number]  HPI  CC: sinus congestion, productive cough  # URI symptoms:  Present for past 2 weeks. About 3 days of worse symptoms, went to the ED.  Nasal congestion, cough (productive of brown/white/yellow/dark green sputum) that his mom told him he was coughing all night, ear pain worse on right.  Medications tried: mucinex, theraflu.  In the ED was prescribed antibiotics (zithromax), mucinex, states he has not gone to the pharmacy to pick these up since he already had the mucinex and thought we would prescribe him something different  Hx of asthma: last time used albuterol 5 years ago.   Hx of allergies: doesn't take anything currently, tried claritin and thinks either allegra or zyrtec, had flonase in the past. ROS: no fever, +chills, no CP, no SOB  Review of Systems   See HPI for ROS. All other systems reviewed and are negative.  Past medical history, surgical, family, and social history reviewed and updated in the EMR. No new updates were made today. Objective:  BP 129/85  Pulse 89  Temp(Src) 98.2 F (36.8 C) (Oral)  Wt 188 lb (85.276 kg) Vitals reviewed  General: NAD, sounds congested HEENT: PERRL, EOMI, no scleral injection. TMs pearly gray bilaterally, no erythema or effusion. Boggy nasal turbinates bilaterally. No posterior pharyngeal erythema or exudate.  Neck: supple, no lymphadenopathy CV: RRR, normal s1 and s2, no murmurs, 2+ radial pulses. Resp: CTAB, effort normal Ext: no edema or cyanosis  Assessment & Plan:  See Problem List Documentation

## 2015-02-06 ENCOUNTER — Encounter (HOSPITAL_COMMUNITY): Payer: Self-pay | Admitting: Emergency Medicine

## 2015-02-06 ENCOUNTER — Emergency Department (HOSPITAL_COMMUNITY)
Admission: EM | Admit: 2015-02-06 | Discharge: 2015-02-06 | Disposition: A | Discharge status: Home / Self Care | Payer: Self-pay | Attending: Emergency Medicine | Admitting: Emergency Medicine

## 2015-02-06 DIAGNOSIS — Z87891 Personal history of nicotine dependence: Secondary | ICD-10-CM | POA: Insufficient documentation

## 2015-02-06 DIAGNOSIS — S233XXA Sprain of ligaments of thoracic spine, initial encounter: Secondary | ICD-10-CM

## 2015-02-06 DIAGNOSIS — Y9389 Activity, other specified: Secondary | ICD-10-CM | POA: Insufficient documentation

## 2015-02-06 DIAGNOSIS — Y999 Unspecified external cause status: Secondary | ICD-10-CM | POA: Insufficient documentation

## 2015-02-06 DIAGNOSIS — Y929 Unspecified place or not applicable: Secondary | ICD-10-CM | POA: Insufficient documentation

## 2015-02-06 DIAGNOSIS — Z8619 Personal history of other infectious and parasitic diseases: Secondary | ICD-10-CM | POA: Insufficient documentation

## 2015-02-06 DIAGNOSIS — X58XXXA Exposure to other specified factors, initial encounter: Secondary | ICD-10-CM | POA: Insufficient documentation

## 2015-02-06 DIAGNOSIS — S29012A Strain of muscle and tendon of back wall of thorax, initial encounter: Secondary | ICD-10-CM | POA: Insufficient documentation

## 2015-02-06 DIAGNOSIS — J45909 Unspecified asthma, uncomplicated: Secondary | ICD-10-CM | POA: Insufficient documentation

## 2015-02-06 DIAGNOSIS — S239XXA Sprain of unspecified parts of thorax, initial encounter: Secondary | ICD-10-CM | POA: Insufficient documentation

## 2015-02-06 MED ORDER — IBUPROFEN 200 MG PO TABS
400.0000 mg | ORAL_TABLET | Freq: Once | ORAL | Status: AC
  Administered 2015-02-06: 400 mg via ORAL
  Filled 2015-02-06: qty 2

## 2015-02-06 MED ORDER — METHOCARBAMOL 500 MG PO TABS
500.0000 mg | ORAL_TABLET | Freq: Once | ORAL | Status: AC
Start: 2015-02-06 — End: 2015-02-06
  Administered 2015-02-06: 500 mg via ORAL
  Filled 2015-02-06: qty 1

## 2015-02-06 MED ORDER — METHOCARBAMOL 500 MG PO TABS: 1000.0000 mg | ORAL_TABLET | Freq: Four times a day (QID) | ORAL | Status: DC | PRN

## 2015-02-06 NOTE — ED Notes (Signed)
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 Pt denies bowel or bladder incontinence, pt ambulatory.

## 2015-02-06 NOTE — Discharge Instructions (Signed)
For pain control you may take up to 800mg of Motrin (also known as ibuprofen). That is usually 4 over the counter pills,  3 times a day. Take with food to minimize stomach irritation   ° °For breakthrough pain you may take Robaxin. Do not drink alcohol, drive or operate heavy machinery when taking Robaxin. ° °Please follow with your primary care doctor in the next 2 days for a check-up. They must obtain records for further management.  ° °Do not hesitate to return to the Emergency Department for any new, worsening or concerning symptoms.  ° ° °

## 2015-02-06 NOTE — ED Provider Notes (Signed)
CSN: [id number]     Arrival date & time 02/06/15  1623 History   First MD Initiated Contact with Patient 02/06/15 1653     Chief Complaint  Patient presents with  . Back Pain     (Consider location/radiation/quality/duration/timing/severity/associated sxs/prior Treatment) HPI  Blood pressure 145/81, pulse 57, temperature 97.6 F (36.4 C), temperature source Oral, resp. rate 18, SpO2 100 %.  Michael Rogers is a 29 y.o. male complaining of 10 out of 10 right thoracic back pain onset 2 days ago after patient was doing heavy lifting the day before. No pain medication taken prior to arrival, states that the pain is exacerbated by movement and palpation. He denies cough, fever, chills, shortness of breath, recent immobilizations, calf pain leg swelling, history of DVT or PE.  Past Medical History  Diagnosis Date  . Asthma   . Allergy   . H. pylori infection    History reviewed. No pertinent past surgical history. Family History  Problem Relation Age of Onset  . Kidney disease Father   . Hypertension Paternal Grandmother    History  Substance Use Topics  . Smoking status: Former Smoker    Start date: 03/17/2013  . Smokeless tobacco: Not on file  . Alcohol Use: No    Review of Systems  10 systems reviewed and found to be negative, except as noted in the HPI.  Allergies  Dust mite extract and Other  Home Medications   Prior to Admission medications   Medication Sig Start Date End Date Taking? Authorizing Provider  ibuprofen (ADVIL,MOTRIN) 200 MG tablet Take 400 mg by mouth every 6 (six) hours as needed for headache, mild pain or moderate pain.   Yes Historical Provider, MD  albuterol (PROVENTIL HFA;VENTOLIN HFA) 108 (90 BASE) MCG/ACT inhaler Inhale 2 puffs into the lungs every 6 (six) hours as needed for wheezing or shortness of breath. Patient not taking: Reported on 02/06/2015 04/19/14   Leone Brand, MD  cetirizine (ZYRTEC) 10 MG tablet Take 1 tablet (10 mg total) by  mouth daily. Patient not taking: Reported on 02/06/2015 04/19/14   Leone Brand, MD   BP 145/81 mmHg  Pulse 57  Temp(Src) 97.6 F (36.4 C) (Oral)  Resp 18  SpO2 100% Physical Exam  Constitutional: He is oriented to person, place, and time. He appears well-developed and well-nourished. No distress.  HENT:  Head: Normocephalic and atraumatic.  Mouth/Throat: Oropharynx is clear and moist.  Eyes: Conjunctivae and EOM are normal. Pupils are equal, round, and reactive to light.  Neck: Normal range of motion. No JVD present. No tracheal deviation present.  Cardiovascular: Normal rate, regular rhythm and intact distal pulses.   Radial pulse equal bilaterally  Pulmonary/Chest: Effort normal and breath sounds normal. No stridor. No respiratory distress. He has no wheezes. He has no rales.   He exhibits tenderness.  Mild tenderness to palpation as diagrammed with no crepitance, no overlying skin changes ecchymoses.  Abdominal: Soft. He exhibits no distension and no mass. There is no tenderness. There is no rebound and no guarding.  Musculoskeletal: Normal range of motion. He exhibits no edema or tenderness.  No calf asymmetry, superficial collaterals, palpable cords, edema, Homans sign negative bilaterally.    Neurological: He is alert and oriented to person, place, and time.  Skin: Skin is warm. He is not diaphoretic.  Psychiatric: He has a normal mood and affect.  Nursing note and vitals reviewed.   ED Course  Procedures (including critical care time) Labs Review  Labs Reviewed - No data to display  Imaging Review No results found.   EKG Interpretation None      MDM   Final diagnoses:  Thoracic sprain and strain, initial encounter    Filed Vitals:   02/06/15 1630  BP: 145/81  Pulse: 57  Temp: 97.6 F (36.4 C)  TempSrc: Oral  Resp: 18  SpO2: 100%    Medications  ibuprofen (ADVIL,MOTRIN) tablet 400 mg (400 mg Oral Given 02/06/15 1705)  methocarbamol (ROBAXIN) tablet  500 mg (500 mg Oral Given 02/06/15 1705)    Michael Rogers is a pleasant 29 y.o. male presenting with right thoracic pain after patient was doing heavy lifting several days ago. Vital signs without abnormality, lung sounds are clear to auscultation, patient is saturating well on room air. This is likely a thoracic muscle strain. Will start him on NSAIDs and muscle relaxers.  Evaluation does not show pathology that would require ongoing emergent intervention or inpatient treatment. Pt is hemodynamically stable and mentating appropriately. Discussed findings and plan with patient/guardian, who agrees with care plan. All questions answered. Return precautions discussed and outpatient follow up given.   New Prescriptions   METHOCARBAMOL (ROBAXIN) 500 MG TABLET    Take 2 tablets (1,000 mg total) by mouth 4 (four) times daily as needed (Pain).         Monico Blitz, PA-C 02/06/15 1710  Charlesetta Shanks, MD 02/07/15 [id number]

## 2015-02-12 ENCOUNTER — Encounter (HOSPITAL_COMMUNITY): Payer: Self-pay | Admitting: Emergency Medicine

## 2015-02-12 ENCOUNTER — Emergency Department (HOSPITAL_COMMUNITY)
Admission: EM | Admit: 2015-02-12 | Discharge: 2015-02-12 | Disposition: A | Discharge status: Home / Self Care | Payer: No Typology Code available for payment source | Attending: Emergency Medicine | Admitting: Emergency Medicine

## 2015-02-12 DIAGNOSIS — Z8619 Personal history of other infectious and parasitic diseases: Secondary | ICD-10-CM | POA: Diagnosis not present

## 2015-02-12 DIAGNOSIS — Y92410 Unspecified street and highway as the place of occurrence of the external cause: Secondary | ICD-10-CM | POA: Diagnosis not present

## 2015-02-12 DIAGNOSIS — Z87828 Personal history of other (healed) physical injury and trauma: Secondary | ICD-10-CM | POA: Diagnosis not present

## 2015-02-12 DIAGNOSIS — Y939 Activity, unspecified: Secondary | ICD-10-CM | POA: Insufficient documentation

## 2015-02-12 DIAGNOSIS — S4991XA Unspecified injury of right shoulder and upper arm, initial encounter: Secondary | ICD-10-CM | POA: Insufficient documentation

## 2015-02-12 DIAGNOSIS — Z87891 Personal history of nicotine dependence: Secondary | ICD-10-CM | POA: Diagnosis not present

## 2015-02-12 DIAGNOSIS — Y999 Unspecified external cause status: Secondary | ICD-10-CM | POA: Diagnosis not present

## 2015-02-12 DIAGNOSIS — J45909 Unspecified asthma, uncomplicated: Secondary | ICD-10-CM | POA: Diagnosis not present

## 2015-02-12 DIAGNOSIS — S3992XA Unspecified injury of lower back, initial encounter: Secondary | ICD-10-CM | POA: Insufficient documentation

## 2015-02-12 DIAGNOSIS — M545 Low back pain, unspecified: Secondary | ICD-10-CM

## 2015-02-12 DIAGNOSIS — S0990XA Unspecified injury of head, initial encounter: Secondary | ICD-10-CM | POA: Diagnosis not present

## 2015-02-12 MED ORDER — METHOCARBAMOL 500 MG PO TABS: 500.0000 mg | ORAL_TABLET | Freq: Two times a day (BID) | ORAL | Status: DC

## 2015-02-12 MED ORDER — METHOCARBAMOL 500 MG PO TABS
500.0000 mg | ORAL_TABLET | Freq: Once | ORAL | Status: AC
  Administered 2015-02-12: 500 mg via ORAL
  Filled 2015-02-12: qty 1

## 2015-02-12 MED ORDER — IBUPROFEN 400 MG PO TABS
800.0000 mg | ORAL_TABLET | Freq: Once | ORAL | Status: AC
  Administered 2015-02-12: 800 mg via ORAL
  Filled 2015-02-12: qty 2

## 2015-02-12 MED ORDER — NAPROXEN 500 MG PO TABS: 500.0000 mg | ORAL_TABLET | Freq: Two times a day (BID) | ORAL | Status: DC
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
## 2015-02-12 NOTE — Discharge Instructions (Signed)
1. Medications: robaxin, naproxyn, usual home medications 2. Treatment: rest, drink plenty of fluids, gentle stretching as discussed, alternate ice and heat 3. Follow Up: Please followup with your primary doctor in 3 days for discussion of your diagnoses and further evaluation after today's visit; if you do not have a primary care doctor use the resource guide provided to find one;  Return to the ER for worsening back pain, difficulty walking, loss of bowel or bladder control or other concerning symptoms     Back Exercises Back exercises help treat and prevent back injuries. The goal of back exercises is to increase the strength of your abdominal and back muscles and the flexibility of your back. These exercises should be started when you no longer have back pain. Back exercises include:  Pelvic Tilt. Lie on your back with your knees bent. Tilt your pelvis until the lower part of your back is against the floor. Hold this position 5 to 10 sec and repeat 5 to 10 times.  Knee to Chest. Pull first 1 knee up against your chest and hold for 20 to 30 seconds, repeat this with the other knee, and then both knees. This may be done with the other leg straight or bent, whichever feels better.  Sit-Ups or Curl-Ups. Bend your knees 90 degrees. Start with tilting your pelvis, and do a partial, slow sit-up, lifting your trunk only 30 to 45 degrees off the floor. Take at least 2 to 3 seconds for each sit-up. Do not do sit-ups with your knees out straight. If partial sit-ups are difficult, simply do the above but with only tightening your abdominal muscles and holding it as directed.  Hip-Lift. Lie on your back with your knees flexed 90 degrees. Push down with your feet and shoulders as you raise your hips a couple inches off the floor; hold for 10 seconds, repeat 5 to 10 times.  Back arches. Lie on your stomach, propping yourself up on bent elbows. Slowly press on your hands, causing an arch in your low back.  Repeat 3 to 5 times. Any initial stiffness and discomfort should lessen with repetition over time.  Shoulder-Lifts. Lie face down with arms beside your body. Keep hips and torso pressed to floor as you slowly lift your head and shoulders off the floor. Do not overdo your exercises, especially in the beginning. Exercises may cause you some mild back discomfort which lasts for a few minutes; however, if the pain is more severe, or lasts for more than 15 minutes, do not continue exercises until you see your caregiver. Improvement with exercise therapy for back problems is slow.  See your caregivers for assistance with developing a proper back exercise program. Document Released: 08/28/2004 Document Revised: 10/13/2011 Document Reviewed: 05/22/2011 [facility] Patient Information 2015 [place], [place]. This information is not intended to replace advice given to you by your health care provider. Make sure you discuss any questions you have with your health care provider.

## 2015-02-16 ENCOUNTER — Ambulatory Visit: Payer: Self-pay | Admitting: Family Medicine

## 2015-02-25 ENCOUNTER — Encounter (HOSPITAL_COMMUNITY): Payer: Self-pay | Admitting: *Deleted

## 2015-02-25 ENCOUNTER — Emergency Department (HOSPITAL_COMMUNITY)
Admission: EM | Admit: 2015-02-25 | Discharge: 2015-02-25 | Disposition: A | Discharge status: Home / Self Care | Payer: Self-pay | Attending: Emergency Medicine | Admitting: Emergency Medicine

## 2015-02-25 DIAGNOSIS — K029 Dental caries, unspecified: Secondary | ICD-10-CM | POA: Insufficient documentation

## 2015-02-25 DIAGNOSIS — Z8619 Personal history of other infectious and parasitic diseases: Secondary | ICD-10-CM | POA: Insufficient documentation

## 2015-02-25 DIAGNOSIS — Z87891 Personal history of nicotine dependence: Secondary | ICD-10-CM | POA: Insufficient documentation

## 2015-02-25 DIAGNOSIS — J45909 Unspecified asthma, uncomplicated: Secondary | ICD-10-CM | POA: Insufficient documentation

## 2015-02-25 DIAGNOSIS — Z79899 Other long term (current) drug therapy: Secondary | ICD-10-CM | POA: Insufficient documentation

## 2015-02-25 DIAGNOSIS — Z791 Long term (current) use of non-steroidal anti-inflammatories (NSAID): Secondary | ICD-10-CM | POA: Insufficient documentation

## 2015-02-25 DIAGNOSIS — K088 Other specified disorders of teeth and supporting structures: Secondary | ICD-10-CM | POA: Insufficient documentation

## 2015-02-25 DIAGNOSIS — K0889 Other specified disorders of teeth and supporting structures: Secondary | ICD-10-CM

## 2015-02-25 MED ORDER — BUPIVACAINE-EPINEPHRINE (PF) 0.5% -1:200000 IJ SOLN
1.8000 mL | Freq: Once | INTRAMUSCULAR | Status: AC
  Administered 2015-02-25: 1.8 mL
  Filled 2015-02-25: qty 1.8

## 2015-02-25 MED ORDER — AMOXICILLIN 500 MG PO CAPS: 500.0000 mg | ORAL_CAPSULE | Freq: Three times a day (TID) | ORAL | Status: DC

## 2015-02-25 MED ORDER — BUPIVACAINE-EPINEPHRINE (PF) 0.5% -1:200000 IJ SOLN
1.8000 mL | Freq: Once | INTRAMUSCULAR | Status: AC
Start: 2015-02-25 — End: 2015-02-25
  Administered 2015-02-25: 1.8 mL
  Filled 2015-02-25: qty 1.8

## 2015-02-25 MED ORDER — TRAMADOL HCL 50 MG PO TABS
50.0000 mg | ORAL_TABLET | Freq: Four times a day (QID) | ORAL | Status: DC | PRN
Start: 2015-02-25 — End: 2017-04-30

## 2015-02-25 NOTE — Discharge Instructions (Signed)

## 2015-02-25 NOTE — ED Provider Notes (Signed)
CSN: [id number]     Arrival date & time 02/25/15  1304 History  This chart was scribed for non-physician practitioner, Delos Haring, PA-C, working with Lajean Saver, MD, by Stephania Fragmin, ED Scribe. This patient was seen in room WTR6/WTR6 and the patient's care was started at 1:52 PM.   Chief Complaint  Patient presents with  . Dental Pain   The history is provided by the patient. No language interpreter was used.    HPI Comments: Michael Rogers is a 29 y.o. male who presents to the Emergency Department complaining of constant, sudden onset, severe upper and lower right sided dental, facial, and jaw pain that began that morning while patient was brushing and flossing his teeth. He states the pain onset as soon as he started to rinse with mouthwash. Patient states that saliva, air, and talking all exacerbate the pain. He denies sore throat.   Past Medical History  Diagnosis Date  . Asthma   . Allergy   . H. pylori infection    History reviewed. No pertinent past surgical history. Family History  Problem Relation Age of Onset  . Kidney disease Father   . Hypertension Paternal Grandmother    History  Substance Use Topics  . Smoking status: Former Smoker    Start date: 03/17/2013  . Smokeless tobacco: Not on file  . Alcohol Use: No    Review of Systems  HENT: Positive for dental problem.   A complete 10 system review of systems was obtained and all systems are negative except as noted in the HPI and PMH.    Allergies  Dust mite extract and Other  Home Medications   Prior to Admission medications   Medication Sig Start Date End Date Taking? Authorizing Provider  albuterol (PROVENTIL HFA;VENTOLIN HFA) 108 (90 BASE) MCG/ACT inhaler Inhale 2 puffs into the lungs every 6 (six) hours as needed for wheezing or shortness of breath. Patient not taking: Reported on 02/06/2015 04/19/14   Leone Brand, MD  amoxicillin (AMOXIL) 500 MG capsule Take 1 capsule (500 mg total) by mouth 3  (three) times daily. 02/25/15   Domenique Southers Carlota Raspberry, PA-C  cetirizine (ZYRTEC) 10 MG tablet Take 1 tablet (10 mg total) by mouth daily. Patient not taking: Reported on 02/06/2015 04/19/14   Leone Brand, MD  ibuprofen (ADVIL,MOTRIN) 200 MG tablet Take 400 mg by mouth every 6 (six) hours as needed for headache, mild pain or moderate pain.    Historical Provider, MD  methocarbamol (ROBAXIN) 500 MG tablet Take 1 tablet (500 mg total) by mouth 2 (two) times daily. 02/12/15   Hannah Muthersbaugh, PA-C  naproxen (NAPROSYN) 500 MG tablet Take 1 tablet (500 mg total) by mouth 2 (two) times daily with a meal. 02/12/15   Hannah Muthersbaugh, PA-C  traMADol (ULTRAM) 50 MG tablet Take 1 tablet (50 mg total) by mouth every 6 (six) hours as needed. 02/25/15   Shaily Librizzi Carlota Raspberry, PA-C   BP 131/87 mmHg  Pulse 51  Temp(Src) 99.1 F (37.3 C) (Oral)  Resp 16  SpO2 99% Physical Exam  Constitutional: He is oriented to person, place, and time. He appears well-developed and well-nourished. No distress.  HENT:  Head: Normocephalic and atraumatic.  Mouth/Throat: Oropharynx is clear and moist. No oral lesions. No trismus in the jaw. Normal dentition. Dental caries present. No dental abscesses or lacerations.    Multiple metallic fillings noted, none are grossly abnormal in appearance.   Eyes: Conjunctivae and EOM are normal. Pupils are equal, round, and  reactive to light.  Neck: Normal range of motion. Neck supple. No tracheal deviation present.  Cardiovascular: Normal rate and regular rhythm.   Pulmonary/Chest: Effort normal and breath sounds normal. No respiratory distress.  Musculoskeletal: Normal range of motion.  Neurological: He is alert and oriented to person, place, and time.  Skin: Skin is warm and dry.  Psychiatric: He has a normal mood and affect. His behavior is normal.  Nursing note and vitals reviewed.   ED Course  Procedures (including critical care time)  DIAGNOSTIC STUDIES: Oxygen Saturation is 99% on  RA, normal by my interpretation.    COORDINATION OF CARE: 1:54 PM - Discussed treatment plan with pt at bedside which includes administering a dental block to right upper tooth. Will give Rx antibiotics and pain medication, and referral for dental f/u. Pt verbalized understanding and agreed to plan.  1:57 PM - Dental block performed by Delos Haring, PA-C.   MDM   Final diagnoses:  Toothache   NERVE BLOCK Date/Time: 02/25/2015 Performed by: Linus Mako Authorized by: Linus Mako Consent: Verbal consent obtained. Risks and benefits: risks, benefits and alternatives were discussed Consent given by: patient Indications: pain relief Body area: face/mouth Laterality: right upper and right lower gauge: 25 G Local anesthetic: lidocaine 2% without epinephrine Anesthetic total: 2 ml Outcome: pain improved Patient tolerance: Patient tolerated the procedure well with no immediate complications. Comments: Patient had complete relief of pain.  No emergent s/sx's present. Patent airway. No trismus.  No neck tenderness or protrusion of tongue or floor of mouth. abx and pain medication. Given dental resources.  Medications  bupivacaine-epinephrine (MARCAINE W/ EPI) 0.5% -1:200000 injection 1.8 mL (not administered)  bupivacaine-epinephrine (MARCAINE W/ EPI) 0.5% -1:200000 injection 1.8 mL (not administered)   29 y.o.Michael Rogers's evaluation in the Emergency Department is complete. It has been determined that no acute conditions requiring further emergency intervention are present at this time. The patient/guardian have been advised of the diagnosis and plan. We have discussed signs and symptoms that warrant return to the ED, such as changes or worsening in symptoms.  Vital signs are stable at discharge. Filed Vitals:   02/25/15 1313  BP: 131/87  Pulse: 51  Temp: 99.1 F (37.3 C)  Resp: 16    Patient/guardian has voiced understanding and agreed to follow-up with the  PCP or specialist.  I personally performed the services described in this documentation, which was scribed in my presence. The recorded information has been reviewed and is accurate.     Delos Haring, PA-C 02/25/15 1413  Lajean Saver, MD 02/25/15 [phone]

## 2015-02-25 NOTE — ED Notes (Signed)
Pt reports brushing his teeth and flossing this am. Afterwards, onset of dental, jaw, facial pain, R sided. Sts upper and lower teeth on R side cannot touch or pain is unbearable. Saliva, air both worsen pain.

## 2015-07-07 ENCOUNTER — Encounter (HOSPITAL_COMMUNITY): Payer: Self-pay | Admitting: *Deleted

## 2015-07-07 ENCOUNTER — Emergency Department (HOSPITAL_COMMUNITY)
Admission: EM | Admit: 2015-07-07 | Discharge: 2015-07-07 | Disposition: A | Discharge status: Home / Self Care | Payer: Self-pay | Attending: Emergency Medicine | Admitting: Emergency Medicine

## 2015-07-07 DIAGNOSIS — M25511 Pain in right shoulder: Secondary | ICD-10-CM | POA: Insufficient documentation

## 2015-07-07 DIAGNOSIS — M549 Dorsalgia, unspecified: Secondary | ICD-10-CM

## 2015-07-07 DIAGNOSIS — Z792 Long term (current) use of antibiotics: Secondary | ICD-10-CM | POA: Insufficient documentation

## 2015-07-07 DIAGNOSIS — J45909 Unspecified asthma, uncomplicated: Secondary | ICD-10-CM | POA: Insufficient documentation

## 2015-07-07 DIAGNOSIS — Z79899 Other long term (current) drug therapy: Secondary | ICD-10-CM | POA: Insufficient documentation

## 2015-07-07 DIAGNOSIS — M546 Pain in thoracic spine: Secondary | ICD-10-CM | POA: Insufficient documentation

## 2015-07-07 DIAGNOSIS — Z87828 Personal history of other (healed) physical injury and trauma: Secondary | ICD-10-CM | POA: Insufficient documentation

## 2015-07-07 DIAGNOSIS — Z8619 Personal history of other infectious and parasitic diseases: Secondary | ICD-10-CM | POA: Insufficient documentation

## 2015-07-07 MED ORDER — KETOROLAC TROMETHAMINE 30 MG/ML IJ SOLN
30.0000 mg | Freq: Once | INTRAMUSCULAR | Status: AC
  Administered 2015-07-07: 30 mg via INTRAMUSCULAR
  Filled 2015-07-07: qty 1

## 2015-07-07 MED ORDER — NAPROXEN 500 MG PO TABS: 500.0000 mg | ORAL_TABLET | Freq: Two times a day (BID) | ORAL | Status: DC

## 2015-07-07 MED ORDER — METHOCARBAMOL 500 MG PO TABS: 500.0000 mg | ORAL_TABLET | Freq: Two times a day (BID) | ORAL | Status: DC

## 2015-07-07 MED ORDER — METHOCARBAMOL 500 MG PO TABS
500.0000 mg | ORAL_TABLET | Freq: Once | ORAL | Status: AC
  Administered 2015-07-07: 500 mg via ORAL
  Filled 2015-07-07: qty 1
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
## 2015-07-07 NOTE — Discharge Instructions (Signed)
1. Medications: naproxen, robaxin, usual home medications 2. Treatment: rest, drink plenty of fluids 3. Follow Up: please followup with orthopedics for discussion of your diagnoses and further evaluation after today's visit; if you do not have a primary care doctor use the resource guide provided to find one; please return to the ER for severe pain, numbness, weakness, loss of control of your bowel or bladder   Musculoskeletal Pain Musculoskeletal pain is muscle and boney aches and pains. These pains can occur in any part of the body. Your caregiver may treat you without knowing the cause of the pain. They may treat you if blood or urine tests, X-rays, and other tests were normal.  CAUSES There is often not a definite cause or reason for these pains. These pains may be caused by a type of germ (virus). The discomfort may also come from overuse. Overuse includes working out too hard when your body is not fit. Boney aches also come from weather changes. Bone is sensitive to atmospheric pressure changes. HOME CARE INSTRUCTIONS   Ask when your test results will be ready. Make sure you get your test results.  Only take over-the-counter or prescription medicines for pain, discomfort, or fever as directed by your caregiver. If you were given medications for your condition, do not drive, operate machinery or power tools, or sign legal documents for 24 hours. Do not drink alcohol. Do not take sleeping pills or other medications that may interfere with treatment.  Continue all activities unless the activities cause more pain. When the pain lessens, slowly resume normal activities. Gradually increase the intensity and duration of the activities or exercise.  During periods of severe pain, bed rest may be helpful. Lay or sit in any position that is comfortable.  Putting ice on the injured area.  Put ice in a bag.  Place a towel between your skin and the bag.  Leave the ice on for 15 to 20 minutes, 3 to  4 times a day.  Follow up with your caregiver for continued problems and no reason can be found for the pain. If the pain becomes worse or does not go away, it may be necessary to repeat tests or do additional testing. Your caregiver may need to look further for a possible cause. SEEK IMMEDIATE MEDICAL CARE IF:  You have pain that is getting worse and is not relieved by medications.  You develop chest pain that is associated with shortness or breath, sweating, feeling sick to your stomach (nauseous), or throw up (vomit).  Your pain becomes localized to the abdomen.  You develop any new symptoms that seem different or that concern you. MAKE SURE YOU:   Understand these instructions.  Will watch your condition.  Will get help right away if you are not doing well or get worse.   This information is not intended to replace advice given to you by your health care provider. Make sure you discuss any questions you have with your health care provider.   Document Released: 07/21/2005 Document Revised: 10/13/2011 Document Reviewed: 03/25/2013 Elsevier Interactive Patient Education [employer].
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
## 2015-08-15 ENCOUNTER — Ambulatory Visit: Payer: Self-pay | Attending: Orthopedic Surgery | Admitting: Physical Therapy

## 2015-08-15 ENCOUNTER — Encounter: Payer: Self-pay | Admitting: Physical Therapy

## 2015-08-15 DIAGNOSIS — M542 Cervicalgia: Secondary | ICD-10-CM | POA: Insufficient documentation

## 2015-08-15 DIAGNOSIS — M25611 Stiffness of right shoulder, not elsewhere classified: Secondary | ICD-10-CM | POA: Insufficient documentation

## 2015-08-15 DIAGNOSIS — M25511 Pain in right shoulder: Secondary | ICD-10-CM | POA: Insufficient documentation
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
## 2015-08-20 ENCOUNTER — Ambulatory Visit: Payer: Self-pay | Admitting: Physical Therapy

## 2015-08-21 ENCOUNTER — Ambulatory Visit: Payer: Self-pay | Admitting: Physical Therapy

## 2015-08-21 ENCOUNTER — Encounter: Payer: Self-pay | Admitting: Physical Therapy

## 2015-08-21 DIAGNOSIS — M25611 Stiffness of right shoulder, not elsewhere classified: Secondary | ICD-10-CM

## 2015-08-21 DIAGNOSIS — M25511 Pain in right shoulder: Secondary | ICD-10-CM

## 2015-08-21 DIAGNOSIS — M542 Cervicalgia: Secondary | ICD-10-CM

## 2015-08-21 NOTE — Therapy (Signed)
[place], [place], [address] Phone: [phone]   Fax:  [phone]  Physical Therapy Treatment  Patient Details  Name: Michael Rogers MRN: [id number] Date of Birth: Jun 26, 1986 No Data Recorded  Encounter Date: 08/21/2015      PT End of Session - 08/21/15 1510    Visit Number 2   Date for PT Re-Evaluation 10/13/15   PT Start Time 1430   PT Stop Time 1510   PT Time Calculation (min) 40 min   Activity Tolerance Patient tolerated treatment well   Behavior During Therapy [facility] for tasks assessed/performed      Past Medical History  Diagnosis Date  . Asthma   . Allergy   . H. pylori infection     History reviewed. No pertinent past surgical history.  There were no vitals filed for this visit.  Visit Diagnosis:  Shoulder stiffness, right  Neck pain on right side  Pain in joint of right shoulder      Subjective Assessment - 08/21/15 1437    Subjective Pt reports that he had pain between his shoulder blades when performing his HEP. No pain today just a sensation under R collar bone   Currently in Pain? No/denies            [facility] PT Assessment - 08/21/15 0001    AROM   AROM Assessment Site Shoulder   Right/Left Shoulder Right   Right Shoulder Flexion 180 Degrees   PROM   PROM Assessment Site --   Right/Left Shoulder --   Right Shoulder Flexion --                     [facility] Adult PT Treatment/Exercise - 08/21/15 0001    Exercises   Exercises Shoulder   Shoulder Exercises: Standing   Horizontal ABduction 15 reps;Theraband  2 sets    Theraband Level (Shoulder Horizontal ABduction) Level 2 (Red)   External Rotation 10 reps;Theraband  2 sets    Theraband Level (Shoulder External Rotation) Level 2 (Red)   Flexion 10 reps;Weights  2 sets    Shoulder Flexion Weight (lbs) 4   ABduction 10 reps  2 sets    Shoulder ABduction Weight (lbs) 3   Extension 15  reps;Theraband;Both  2 sets    Theraband Level (Shoulder Extension) Level 3 (Green)   Other Standing Exercises OHP with blue weighted ball 2x15   Other Standing Exercises Shruggs 3x10 #10; Wall push ups 2x10   Shoulder Exercises: ROM/Strengthening   UBE (Upper Arm Bike) L2 25frd/3rev   Shoulder Exercises: Power [employer] 10 reps  2 sets    Row Limitations #25    Other Power Tower Exercises Lat pull downs #25 2x10                  PT Short Term Goals - 08/21/15 1503    PT SHORT TERM GOAL #1   Title Pt will be independent with initial HEP   Status Achieved           PT Long Term Goals - 08/21/15 1513    PT LONG TERM GOAL #2   Title Pt will increase active shoulder flexion to 180 degrees.   Status Achieved               Plan - 08/21/15 1511    Clinical Impression Statement Progressed to gym level exercises. Pt has progressed and increase bilat shoulder  ROM WNL. Does report a mild discomfort around R collar bone area. Pt demo ed good strength throughout treatment   Pt will benefit from skilled therapeutic intervention in order to improve on the following deficits Decreased activity tolerance;Decreased endurance;Decreased range of motion;Decreased mobility;Increased muscle spasms;Pain   Rehab Potential Good   PT Frequency 2x / week   PT Duration 8 weeks   PT Treatment/Interventions ADLs/Self Care Home Management;Electrical Stimulation;Traction;Moist Heat;Therapeutic activities;Therapeutic exercise;Patient/family education;Manual techniques   PT Next Visit Plan scapular stabilization exercises        Problem List Patient Active Problem List   Diagnosis Date Noted  . URI (upper respiratory infection) 04/19/2014  . Sprain of ankle, unspecified site 01/25/2014  . Abdominal pain, epigastric 04/07/2013  . Back pain 04/07/2013  . Tobacco abuse 02/20/2012  . Asthma, intermittent 01/21/2012  . H. pylori infection 01/21/2012  . Seasonal allergies 01/21/2012   . Chronic headache 01/21/2012    Scot Jun, PTA  08/21/2015, 3:14 PM  [place], [place], [address] Phone: [phone]   Fax:  [phone]  Name: Michael Rogers MRN: [id number] Date of Birth: [birth year]

## 2015-08-22 ENCOUNTER — Encounter: Payer: Self-pay | Admitting: Physical Therapy

## 2015-08-22 ENCOUNTER — Ambulatory Visit: Payer: Self-pay | Admitting: Physical Therapy

## 2015-08-22 DIAGNOSIS — M25611 Stiffness of right shoulder, not elsewhere classified: Secondary | ICD-10-CM

## 2015-08-22 DIAGNOSIS — M542 Cervicalgia: Secondary | ICD-10-CM

## 2015-08-22 DIAGNOSIS — M25511 Pain in right shoulder: Secondary | ICD-10-CM

## 2015-08-22 NOTE — Therapy (Signed)
[place], [place], [address] Phone: [phone]   Fax:  [phone]  Physical Therapy Treatment  Patient Details  Name: Michael Rogers MRN: [id number] Date of Birth: 1986/06/27 No Data Recorded  Encounter Date: 08/22/2015      PT End of Session - 08/22/15 1138    Visit Number 3   PT Start Time [id number]   PT Stop Time 1139   PT Time Calculation (min) 41 min   Activity Tolerance Patient tolerated treatment well   Behavior During Therapy [facility] for tasks assessed/performed      Past Medical History  Diagnosis Date  . Asthma   . Allergy   . H. pylori infection     History reviewed. No pertinent past surgical history.  There were no vitals filed for this visit.  Visit Diagnosis:  Pain in joint of right shoulder  Neck pain on right side  Shoulder stiffness, right      Subjective Assessment - 08/22/15 1056    Subjective Pt reports that he felt good overall yesterday after last treatment, Continues to have a weird sensation around R collarbone.    Currently in Pain? No/denies                         [facility] Adult PT Treatment/Exercise - 08/22/15 0001    Exercises   Exercises Shoulder   Shoulder Exercises: Standing   Horizontal ABduction 15 reps;Theraband  2 sets   Theraband Level (Shoulder Horizontal ABduction) Level 2 (Red)   External Rotation 15 reps  2 sets    Theraband Level (Shoulder External Rotation) Level 2 (Red)   Flexion 15 reps  2 sets    Shoulder Flexion Weight (lbs) 5   ABduction 15 reps  2 sets    Shoulder ABduction Weight (lbs) 4   Extension 15 reps;Theraband;Both  2 sets    Theraband Level (Shoulder Extension) Level 3 (Green)   Other Standing Exercises Wall push-ups with physo ball 2x15    Other Standing Exercises Shrugs 2x15 #10   Shoulder Exercises: ROM/Strengthening   UBE (Upper Arm Bike) L3 70frd/3rev   Shoulder Exercises: Power [employer]  15 reps  2 sets    Row Limitations #35    Other Power [employer] Exercises Lat pull downs #35 2x15; Chest press #35 2x15   Other Power [employer] Exercises Standing rev grip rows #35 2x15; Standing straight arm pull downs #35 2x15                   PT Short Term Goals - 08/21/15 1503    PT SHORT TERM GOAL #1   Title Pt will be independent with initial HEP   Status Achieved           PT Long Term Goals - 08/21/15 1513    PT LONG TERM GOAL #2   Title Pt will increase active shoulder flexion to 180 degrees.   Status Achieved               Plan - 08/22/15 1138    Clinical Impression Statement Pt has good strength and ROM throughout treatment with each intervention. Does c/o minor pain around R shoulder blase at times. Continues to report a weird sensation around R collarbone, otherwise no issues with today's session.   Pt will benefit from skilled therapeutic intervention in order to improve on the following deficits Decreased activity tolerance;Decreased endurance;Decreased  range of motion;Decreased mobility;Increased muscle spasms;Pain   Rehab Potential Good   PT Frequency 2x / week   PT Duration 8 weeks   PT Treatment/Interventions ADLs/Self Care Home Management;Electrical Stimulation;Traction;Moist Heat;Therapeutic activities;Therapeutic exercise;Patient/family education;Manual techniques   PT Next Visit Plan scapular stabilization exercises        Problem List Patient Active Problem List   Diagnosis Date Noted  . URI (upper respiratory infection) 04/19/2014  . Sprain of ankle, unspecified site 01/25/2014  . Abdominal pain, epigastric 04/07/2013  . Back pain 04/07/2013  . Tobacco abuse 02/20/2012  . Asthma, intermittent 01/21/2012  . H. pylori infection 01/21/2012  . Seasonal allergies 01/21/2012  . Chronic headache 01/21/2012    Scot Jun, PTA  08/22/2015, 11:40 AM  [place], [place], [address] Phone: [phone]   Fax:  [phone]  Name: Michael Rogers MRN: [id number] Date of Birth: [birth year]

## 2015-08-27 ENCOUNTER — Encounter: Payer: Self-pay | Admitting: Physical Therapy

## 2015-08-27 ENCOUNTER — Ambulatory Visit: Payer: Self-pay | Admitting: Physical Therapy

## 2015-08-27 DIAGNOSIS — M542 Cervicalgia: Secondary | ICD-10-CM

## 2015-08-27 DIAGNOSIS — M25511 Pain in right shoulder: Secondary | ICD-10-CM

## 2015-08-27 DIAGNOSIS — M25611 Stiffness of right shoulder, not elsewhere classified: Secondary | ICD-10-CM

## 2015-08-27 NOTE — Therapy (Signed)
[place], [place], [address] Phone: [phone]   Fax:  [phone]  Physical Therapy Treatment  Patient Details  Name: Michael Rogers MRN: [id number] Date of Birth: January 25, 1986 No Data Recorded  Encounter Date: 08/27/2015      PT End of Session - 08/27/15 1141    Visit Number 4   Date for PT Re-Evaluation 10/13/15   PT Start Time 1105   PT Stop Time 1142   PT Time Calculation (min) 37 min      Past Medical History  Diagnosis Date  . Asthma   . Allergy   . H. pylori infection     History reviewed. No pertinent past surgical history.  There were no vitals filed for this visit.  Visit Diagnosis:  Shoulder stiffness, right  Neck pain on right side  Pain in joint of right shoulder      Subjective Assessment - 08/27/15 1106    Subjective Pt reports that he was good all weekend, But woke up this morning with pain between his shoulder blades  when looks down.   Currently in Pain? No/denies   Multiple Pain Sites No                         [facility] Adult PT Treatment/Exercise - 08/27/15 0001    Shoulder Exercises: Standing   Horizontal ABduction 15 reps;Theraband  2 sets    Theraband Level (Shoulder Horizontal ABduction) Level 4 (Blue)   Flexion 15 reps  2 sets    Shoulder Flexion Weight (lbs) 5   ABduction 15 reps  2 sets    Shoulder ABduction Weight (lbs) 5   Extension 15 reps;Theraband;Both  2 sets    Theraband Level (Shoulder Extension) Level 3 (Green)   Other Standing Exercises Wall push-ups with physo ball 2x15    Other Standing Exercises Shruggs 2x15 #10   Shoulder Exercises: ROM/Strengthening   UBE (Upper Arm Bike) L5 53fd/3rev   Shoulder Exercises: Power [employer]15 reps  2 sets    Row Limitations 45   Other Power [employer]Exercises Lat pull downs #35 2x15; Chest press #35 2x15   Other Power [employer]Exercises Standing rev grip rows #35 2x15;  Standing straight arm pull downs #35 2x15                   PT Short Term Goals - 08/21/15 1503    PT SHORT TERM GOAL #1   Title Pt will be independent with initial HEP   Status Achieved           PT Long Term Goals - 08/27/15 1140    PT LONG TERM GOAL #1   Title Pt will report 0/10 pain after work shift.    Status Achieved   PT LONG TERM GOAL #3   Title Pt will demonstrate proper body mechanics with heavy lifting and report 0/10 pain.   Status On-going               Plan - 08/27/15 1142    Clinical Impression Statement Continues to do well, reports less pain overall. Has met some LTG, Good strength and ROM    Pt will benefit from skilled therapeutic intervention in order to improve on the following deficits Decreased activity tolerance;Decreased endurance;Decreased range of motion;Decreased mobility;Increased muscle spasms;Pain   Rehab Potential Good   PT Frequency 2x / week   PT  Duration 8 weeks   PT Treatment/Interventions ADLs/Self Care Home Management;Electrical Stimulation;Traction;Moist Heat;Therapeutic activities;Therapeutic exercise;Patient/family education;Manual techniques   PT Next Visit Plan functional lifting        Problem List Patient Active Problem List   Diagnosis Date Noted  . URI (upper respiratory infection) 04/19/2014  . Sprain of ankle, unspecified site 01/25/2014  . Abdominal pain, epigastric 04/07/2013  . Back pain 04/07/2013  . Tobacco abuse 02/20/2012  . Asthma, intermittent 01/21/2012  . H. pylori infection 01/21/2012  . Seasonal allergies 01/21/2012  . Chronic headache 01/21/2012    Scot Jun, PTA 08/27/2015, 11:43 AM  [place], [place], [address] Phone: [phone]   Fax:  [phone]  Name: [name] MRN: [id number] Date of Birth: January 16, 1986

## 2015-08-29 ENCOUNTER — Ambulatory Visit: Payer: Self-pay | Admitting: Physical Therapy

## 2015-08-29 ENCOUNTER — Encounter: Payer: Self-pay | Admitting: Physical Therapy

## 2015-08-29 DIAGNOSIS — M25611 Stiffness of right shoulder, not elsewhere classified: Secondary | ICD-10-CM

## 2015-08-29 DIAGNOSIS — M25511 Pain in right shoulder: Secondary | ICD-10-CM

## 2015-08-29 DIAGNOSIS — M542 Cervicalgia: Secondary | ICD-10-CM

## 2015-08-29 NOTE — Therapy (Signed)
[place], [place], [address] Phone: [phone]   Fax:  [phone]  Physical Therapy Treatment  Patient Details  Name: Michael Rogers MRN: [id number] Date of Birth: 1986/03/11 No Data Recorded  Encounter Date: 08/29/2015      PT End of Session - 08/29/15 1139    Visit Number 5   Date for PT Re-Evaluation 10/13/15   PT Start Time 1059   PT Stop Time 1139   PT Time Calculation (min) 40 min   Activity Tolerance Patient tolerated treatment well   Behavior During Therapy [facility] for tasks assessed/performed      Past Medical History  Diagnosis Date  . Asthma   . Allergy   . H. pylori infection     History reviewed. No pertinent past surgical history.  There were no vitals filed for this visit.  Visit Diagnosis:  Pain in joint of right shoulder  Neck pain on right side  Shoulder stiffness, right      Subjective Assessment - 08/29/15 1103    Subjective Pt report the he has a split second of sharp pain at times.   Currently in Pain? No/denies   Multiple Pain Sites No                         [facility] Adult PT Treatment/Exercise - 08/29/15 0001    Shoulder Exercises: Supine   Other Supine Exercises Serratus punches #10 3x15   Shoulder Exercises: Standing   External Rotation 15 reps  2 sets   Theraband Level (Shoulder External Rotation) Level 3 (Green)   Internal Rotation 15 reps;Theraband  2 sets    Theraband Level (Shoulder Internal Rotation) Level 3 (Green)   Other Standing Exercises Standing straight arm pull downs #35 2x15; standing rev grip rows 2x15 #35   Other Standing Exercises OHP #10 2x15; body blade RUE elbow 90 deg IR/ER Flx/ext    Shoulder Exercises: ROM/Strengthening   UBE (Upper Arm Bike) Constant work 25watts 3 frd/3rev   Shoulder Exercises: Power [employer] 15 reps  2 sets    Row Limitations 45   Other Power [employer] Exercises Lat pull downs #45  2x15; Chest press #45 2x15   Other Power Tower Exercises Seated chest flys #25 x10                  PT Short Term Goals - 08/21/15 1503    PT SHORT TERM GOAL #1   Title Pt will be independent with initial HEP   Status Achieved           PT Long Term Goals - 08/27/15 1140    PT LONG TERM GOAL #1   Title Pt will report 0/10 pain after work shift.    Status Achieved   PT LONG TERM GOAL #3   Title Pt will demonstrate proper body mechanics with heavy lifting and report 0/10 pain.   Status On-going               Plan - 08/29/15 1139    Clinical Impression Statement Does well this in Pt this day. Pt has very good strength and ROM but continues to have some pain in R collarbone area.   Pt will benefit from skilled therapeutic intervention in order to improve on the following deficits Decreased activity tolerance;Decreased endurance;Decreased range of motion;Decreased mobility;Increased muscle spasms;Pain   PT Frequency 2x / week  PT Duration 8 weeks   PT Treatment/Interventions ADLs/Self Care Home Management;Electrical Stimulation;Traction;Moist Heat;Therapeutic activities;Therapeutic exercise;Patient/family education;Manual techniques   PT Next Visit Plan functional lifting        Problem List Patient Active Problem List   Diagnosis Date Noted  . URI (upper respiratory infection) 04/19/2014  . Sprain of ankle, unspecified site 01/25/2014  . Abdominal pain, epigastric 04/07/2013  . Back pain 04/07/2013  . Tobacco abuse 02/20/2012  . Asthma, intermittent 01/21/2012  . H. pylori infection 01/21/2012  . Seasonal allergies 01/21/2012  . Chronic headache 01/21/2012    Scot Jun, PTA  08/29/2015, 11:47 AM  [place], [place], [address] Phone: [phone]   Fax:  [phone]  Name: ODELL CARRERO MRN: [id number] Date of Birth: 1986-04-22

## 2015-08-31 ENCOUNTER — Emergency Department (HOSPITAL_COMMUNITY): Payer: Self-pay

## 2015-08-31 ENCOUNTER — Encounter (HOSPITAL_COMMUNITY): Payer: Self-pay | Admitting: Emergency Medicine

## 2015-08-31 ENCOUNTER — Emergency Department (HOSPITAL_COMMUNITY)
Admission: EM | Admit: 2015-08-31 | Discharge: 2015-08-31 | Disposition: A | Discharge status: Home / Self Care | Payer: Self-pay | Attending: Emergency Medicine | Admitting: Emergency Medicine

## 2015-08-31 DIAGNOSIS — R531 Weakness: Secondary | ICD-10-CM | POA: Insufficient documentation

## 2015-08-31 DIAGNOSIS — Z792 Long term (current) use of antibiotics: Secondary | ICD-10-CM | POA: Insufficient documentation

## 2015-08-31 DIAGNOSIS — Z87891 Personal history of nicotine dependence: Secondary | ICD-10-CM | POA: Insufficient documentation

## 2015-08-31 DIAGNOSIS — J45909 Unspecified asthma, uncomplicated: Secondary | ICD-10-CM | POA: Insufficient documentation

## 2015-08-31 DIAGNOSIS — R0600 Dyspnea, unspecified: Secondary | ICD-10-CM | POA: Insufficient documentation

## 2015-08-31 DIAGNOSIS — R05 Cough: Secondary | ICD-10-CM | POA: Insufficient documentation

## 2015-08-31 DIAGNOSIS — Z8619 Personal history of other infectious and parasitic diseases: Secondary | ICD-10-CM | POA: Insufficient documentation

## 2015-08-31 DIAGNOSIS — Z79899 Other long term (current) drug therapy: Secondary | ICD-10-CM | POA: Insufficient documentation

## 2015-08-31 DIAGNOSIS — Z791 Long term (current) use of non-steroidal anti-inflammatories (NSAID): Secondary | ICD-10-CM | POA: Insufficient documentation

## 2015-08-31 LAB — CBC WITH DIFFERENTIAL/PLATELET
Basophils Absolute: 0 10*3/uL (ref 0.0–0.1)
Basophils Relative: 0 %
Eosinophils Absolute: 0.1 10*3/uL (ref 0.0–0.7)
Eosinophils Relative: 1 %
HCT: 45.1 % (ref 39.0–52.0)
Hemoglobin: 15.7 g/dL (ref 13.0–17.0)
Lymphocytes Relative: 21 %
Lymphs Abs: 1.6 10*3/uL (ref 0.7–4.0)
MCH: 34.1 pg — ABNORMAL HIGH (ref 26.0–34.0)
MCHC: 34.8 g/dL (ref 30.0–36.0)
MCV: 98 fL (ref 78.0–100.0)
Monocytes Absolute: 0.7 10*3/uL (ref 0.1–1.0)
Monocytes Relative: 10 %
Neutro Abs: 5.2 10*3/uL (ref 1.7–7.7)
Neutrophils Relative %: 68 %
Platelets: 251 10*3/uL (ref 150–400)
RBC: 4.6 MIL/uL (ref 4.22–5.81)
RDW: 13.3 % (ref 11.5–15.5)
WBC: 7.6 10*3/uL (ref 4.0–10.5)

## 2015-08-31 LAB — BASIC METABOLIC PANEL
Anion gap: 9 (ref 5–15)
BUN: 12 mg/dL (ref 6–20)
CO2: 30 mmol/L (ref 22–32)
Calcium: 10.3 mg/dL (ref 8.9–10.3)
Chloride: 105 mmol/L (ref 101–111)
Creatinine, Ser: 0.97 mg/dL (ref 0.61–1.24)
GFR calc Af Amer: >60 mL/min (ref >60)
GFR calc non Af Amer: >60 mL/min (ref >60)
Glucose, Bld: 75 mg/dL (ref 65–99)
Potassium: 4.1 mmol/L (ref 3.5–5.1)
Sodium: 144 mmol/L (ref 135–145)

## 2015-08-31 MED ORDER — PROCHLORPERAZINE EDISYLATE 5 MG/ML IJ SOLN: 10.0000 mg | Freq: Four times a day (QID) | INTRAMUSCULAR | Status: DC | PRN

## 2015-08-31 MED ORDER — KETOROLAC TROMETHAMINE 15 MG/ML IJ SOLN
15.0000 mg | Freq: Once | INTRAMUSCULAR | Status: AC
  Administered 2015-08-31: 15 mg via INTRAVENOUS
  Filled 2015-08-31: qty 1

## 2015-08-31 MED ORDER — SODIUM CHLORIDE 0.9 % IV BOLUS (SEPSIS)
1000.0000 mL | Freq: Once | INTRAVENOUS | Status: AC
  Administered 2015-08-31: 1000 mL via INTRAVENOUS

## 2015-08-31 NOTE — ED Notes (Signed)
Pt reports he has felt unwell for the past month. Thought he was getting better at the beginning of the week, but symptoms returned. Pt nasueated, no emesis. No diarrhea. Has had runny nose, sore throat and cough.

## 2015-08-31 NOTE — ED Provider Notes (Signed)
CSN: [id number]     Arrival date & time 08/31/15  1626 History   First MD Initiated Contact with Patient 08/31/15 1700     Chief Complaint  Patient presents with  . Weakness     (Consider location/radiation/quality/duration/timing/severity/associated sxs/prior Treatment) HPI   30yM with generalized weakness for the past 3 weeks. Initially felt better for a couple days after the first weak but then increasing symptoms again. Reports productive cough. "Gold" colored sputum. Mild dyspnea. No fever or chills. No urinary complaints. Feels generally "run down" and hasn't had much of an appetite. Reports that his son and SE other recently with URI symptoms but they have improved while he feels no better. Hx of asthma. Denies recent wheezing. No unusual leg pain or swelling. No urinary complaints. Some nausea, but no vomiting. No diarrhea.  Past Medical History  Diagnosis Date  . Asthma   . Allergy   . H. pylori infection    History reviewed. No pertinent past surgical history. Family History  Problem Relation Age of Onset  . Kidney disease Father   . Hypertension Paternal Grandmother    Social History  Substance Use Topics  . Smoking status: Former Smoker    Start date: 03/17/2013  . Smokeless tobacco: None  . Alcohol Use: No    Review of Systems  All systems reviewed and negative, other than as noted in HPI.   Allergies  Dust mite extract and Other  Home Medications   Prior to Admission medications   Medication Sig Start Date End Date Taking? Authorizing Provider  albuterol (PROVENTIL HFA;VENTOLIN HFA) 108 (90 BASE) MCG/ACT inhaler Inhale 2 puffs into the lungs every 6 (six) hours as needed for wheezing or shortness of breath. Patient not taking: Reported on 02/06/2015 04/19/14   Leone Brand, MD  amoxicillin (AMOXIL) 500 MG capsule Take 1 capsule (500 mg total) by mouth 3 (three) times daily. 02/25/15   Tiffany Carlota Raspberry, PA-C  cetirizine (ZYRTEC) 10 MG tablet Take 1 tablet (10  mg total) by mouth daily. Patient not taking: Reported on 02/06/2015 04/19/14   Leone Brand, MD  ibuprofen (ADVIL,MOTRIN) 200 MG tablet Take 400 mg by mouth every 6 (six) hours as needed for headache, mild pain or moderate pain.    Historical Provider, MD  methocarbamol (ROBAXIN) 500 MG tablet Take 1 tablet (500 mg total) by mouth 2 (two) times daily. 07/07/15   Marella Chimes, PA-C  naproxen (NAPROSYN) 500 MG tablet Take 1 tablet (500 mg total) by mouth 2 (two) times daily with a meal. 07/07/15   Marella Chimes, PA-C  traMADol (ULTRAM) 50 MG tablet Take 1 tablet (50 mg total) by mouth every 6 (six) hours as needed. 02/25/15   Tiffany Carlota Raspberry, PA-C   BP 128/89 mmHg  Pulse 99  Temp(Src) 98.6 F (37 C) (Oral)  Resp 16  SpO2 96% Physical Exam  Constitutional: He appears well-developed and well-nourished. No distress.  Laying in bed. NAD.   HENT:  Head: Normocephalic and atraumatic.  Mouth/Throat: Oropharynx is clear and moist.  Eyes: Conjunctivae are normal. Right eye exhibits no discharge. Left eye exhibits no discharge.  Neck: Neck supple.  Cardiovascular: Normal rate, regular rhythm and normal heart sounds.  Exam reveals no gallop and no friction rub.   No murmur heard. Pulmonary/Chest: Effort normal and breath sounds normal. No respiratory distress. He has no wheezes.  Abdominal: Soft. He exhibits no distension. There is no tenderness.  Musculoskeletal: He exhibits no edema or tenderness.  Lower extremities symmetric as compared to each other. No calf tenderness. Negative Homan's. No palpable cords.   Neurological: He is alert.  Skin: Skin is warm and dry. No rash noted.  Psychiatric: He has a normal mood and affect. His behavior is normal. Thought content normal.  Nursing note and vitals reviewed.   ED Course  Procedures (including critical care time) Labs Review Labs Reviewed  CBC WITH DIFFERENTIAL/PLATELET - Abnormal; Notable for the following:    MCH 34.1 (*)     All other components within normal limits  BASIC METABOLIC PANEL    Imaging Review No results found.   Dg Chest 2 View  08/31/2015  CLINICAL DATA:  Productive cough for 3 weeks 09/07/2013 EXAM: CHEST  2 VIEW COMPARISON:  None. FINDINGS: The heart size and mediastinal contours are within normal limits. Both lungs are clear. The visualized skeletal structures are unremarkable. IMPRESSION: No active cardiopulmonary disease. Electronically Signed   By: Inez Catalina M.D.   On: 08/31/2015 17:57  I have personally reviewed and evaluated these images and lab results as part of my medical decision-making.   EKG Interpretation None      MDM   Final diagnoses:  Weakness generalized   30 year old male with generalized weakness. Low suspicion for emergent process. HD stable. Unremarkable workup. It has been determined that no acute conditions requiring further emergency intervention are present at this time. The patient has been advised of the diagnosis and plan. I reviewed any labs and imaging including any potential incidental findings. We have discussed signs and symptoms that warrant return to the ED and they are listed in the discharge instructions.      Virgel Manifold, MD 09/02/15 2328

## 2015-08-31 NOTE — Discharge Instructions (Signed)
Fatigue  Fatigue is feeling tired all of the time, a lack of energy, or a lack of motivation. Occasional or mild fatigue is often a normal response to activity or life in general. However, long-lasting (chronic) or extreme fatigue may indicate an underlying medical condition.  HOME CARE INSTRUCTIONS   Watch your fatigue for any changes. The following actions may help to lessen any discomfort you are feeling:  · Talk to your health care provider about how much sleep you need each night. Try to get the required amount every night.  · Take medicines only as directed by your health care provider.  · Eat a healthy and nutritious diet. Ask your health care provider if you need help changing your diet.  · Drink enough fluid to keep your urine clear or pale yellow.  · Practice ways of relaxing, such as yoga, meditation, massage therapy, or acupuncture.  · Exercise regularly.    · Change situations that cause you stress. Try to keep your work and personal routine reasonable.  · Do not abuse illegal drugs.  · Limit alcohol intake to no more than 1 drink per day for nonpregnant women and 2 drinks per day for men. One drink equals 12 ounces of beer, 5 ounces of wine, or 1½ ounces of hard liquor.  · Take a multivitamin, if directed by your health care provider.  SEEK MEDICAL CARE IF:   · Your fatigue does not get better.  · You have a fever.    · You have unintentional weight loss or gain.  · You have headaches.    · You have difficulty:      Falling asleep.    Sleeping throughout the night.  · You feel angry, guilty, anxious, or sad.     · You are unable to have a bowel movement (constipation).    · You skin is dry.     · Your legs or another part of your body is swollen.    SEEK IMMEDIATE MEDICAL CARE IF:   · You feel confused.    · Your vision is blurry.  · You feel faint or pass out.    · You have a severe headache.    · You have severe abdominal, pelvic, or back pain.    · You have chest pain, shortness of breath, or an  irregular or fast heartbeat.    · You are unable to urinate or you urinate less than normal.    · You develop abnormal bleeding, such as bleeding from the rectum, vagina, nose, lungs, or nipples.  · You vomit blood.     · You have thoughts about harming yourself or committing suicide.    · You are worried that you might harm someone else.       This information is not intended to replace advice given to you by your health care provider. Make sure you discuss any questions you have with your health care provider.     Document Released: 05/18/2007 Document Revised: 08/11/2014 Document Reviewed: 11/22/2013  [employer]vier Interactive Patient Education ©2016 [employer]

## 2015-09-03 ENCOUNTER — Encounter: Payer: Self-pay | Admitting: Physical Therapy

## 2015-09-03 ENCOUNTER — Ambulatory Visit: Payer: Self-pay | Admitting: Physical Therapy

## 2015-09-03 DIAGNOSIS — M25511 Pain in right shoulder: Secondary | ICD-10-CM

## 2015-09-03 DIAGNOSIS — M542 Cervicalgia: Secondary | ICD-10-CM

## 2015-09-03 DIAGNOSIS — M25611 Stiffness of right shoulder, not elsewhere classified: Secondary | ICD-10-CM

## 2015-09-03 NOTE — Therapy (Signed)
[place], [place], [address] Phone: [phone]   Fax:  [phone]  Physical Therapy Treatment  Patient Details  Name: Michael Rogers MRN: [id number] Date of Birth: May 14, 1986 No Data Recorded  Encounter Date: 09/03/2015      PT End of Session - 09/03/15 1140    Visit Number 6   Date for PT Re-Evaluation 10/13/15   PT Start Time 1100   PT Stop Time 1144   PT Time Calculation (min) 44 min      Past Medical History  Diagnosis Date  . Asthma   . Allergy   . H. pylori infection     History reviewed. No pertinent past surgical history.  There were no vitals filed for this visit.  Visit Diagnosis:  Shoulder stiffness, right  Neck pain on right side  Pain in joint of right shoulder      Subjective Assessment - 09/03/15 1103    Subjective I" feel great"   Currently in Pain? No/denies   Pain Score 0-No pain                         [facility] Adult PT Treatment/Exercise - 09/03/15 0001    Lumbar Exercises: Standing   Other Standing Lumbar Exercises Functional box carry #15 ~40 ft x3    Shoulder Exercises: Supine   Other Supine Exercises Serratus punches #10 3x15   Shoulder Exercises: Standing   External Rotation 15 reps  2 sets    Theraband Level (Shoulder External Rotation) Level 3 (Green)   Internal Rotation 15 reps;Theraband  2 sets    Theraband Level (Shoulder Internal Rotation) Level 3 (Green)   Other Standing Exercises Standing straight arm pull downs #35 2x15; standing rev grip rows 2x15 #35   Other Standing Exercises OHP #10 2x15; body blade RUE elbow 90 deg IR/ER Flx/ext    Shoulder Exercises: ROM/Strengthening   UBE (Upper Arm Bike) L6.5 3 frd/3rev   Shoulder Exercises: Power [employer] 15 reps  2 sets    Row Limitations 45   Other Power [employer] Exercises Lat pull downs #45 2x15; Chest press #45 2x15   Other Power Tower Exercises Seated chest flys  #25 2x15                  PT Short Term Goals - 08/21/15 1503    PT SHORT TERM GOAL #1   Title Pt will be independent with initial HEP   Status Achieved           PT Long Term Goals - 09/03/15 1141    PT LONG TERM GOAL #3   Title Pt will demonstrate proper body mechanics with heavy lifting and report 0/10 pain.   Status Partially Met               Plan - 09/03/15 1140    Clinical Impression Statement Pt continues to progress well towards goals, demos good strength and ROM .   Pt will benefit from skilled therapeutic intervention in order to improve on the following deficits Decreased activity tolerance;Decreased endurance;Decreased range of motion;Decreased mobility;Increased muscle spasms;Pain   Rehab Potential Good   PT Frequency 2x / week   PT Duration 8 weeks   PT Treatment/Interventions ADLs/Self Care Home Management;Electrical Stimulation;Traction;Moist Heat;Therapeutic activities;Therapeutic exercise;Patient/family education;Manual techniques   PT Next Visit Plan D/C PT        Problem List Patient Active  Problem List   Diagnosis Date Noted  . URI (upper respiratory infection) 04/19/2014  . Sprain of ankle, unspecified site 01/25/2014  . Abdominal pain, epigastric 04/07/2013  . Back pain 04/07/2013  . Tobacco abuse 02/20/2012  . Asthma, intermittent 01/21/2012  . H. pylori infection 01/21/2012  . Seasonal allergies 01/21/2012  . Chronic headache 01/21/2012    Scot Jun, PTA  09/03/2015, 11:42 AM  [place], [place], [address] Phone: [phone]   Fax:  [phone]  Name: EILAM SHREWSBURY MRN: [id number] Date of Birth: [birth year]

## 2015-09-04 ENCOUNTER — Emergency Department (HOSPITAL_COMMUNITY)
Admission: EM | Admit: 2015-09-04 | Discharge: 2015-09-04 | Disposition: A | Discharge status: Home / Self Care | Payer: Self-pay | Attending: Emergency Medicine | Admitting: Emergency Medicine

## 2015-09-04 ENCOUNTER — Emergency Department (HOSPITAL_COMMUNITY): Payer: Self-pay

## 2015-09-04 ENCOUNTER — Encounter (HOSPITAL_COMMUNITY): Payer: Self-pay

## 2015-09-04 DIAGNOSIS — Z791 Long term (current) use of non-steroidal anti-inflammatories (NSAID): Secondary | ICD-10-CM | POA: Insufficient documentation

## 2015-09-04 DIAGNOSIS — S92324A Nondisplaced fracture of second metatarsal bone, right foot, initial encounter for closed fracture: Secondary | ICD-10-CM | POA: Insufficient documentation

## 2015-09-04 DIAGNOSIS — Z87891 Personal history of nicotine dependence: Secondary | ICD-10-CM | POA: Insufficient documentation

## 2015-09-04 DIAGNOSIS — S92321A Displaced fracture of second metatarsal bone, right foot, initial encounter for closed fracture: Secondary | ICD-10-CM

## 2015-09-04 DIAGNOSIS — J45909 Unspecified asthma, uncomplicated: Secondary | ICD-10-CM | POA: Insufficient documentation

## 2015-09-04 DIAGNOSIS — Y9289 Other specified places as the place of occurrence of the external cause: Secondary | ICD-10-CM | POA: Insufficient documentation

## 2015-09-04 DIAGNOSIS — W228XXA Striking against or struck by other objects, initial encounter: Secondary | ICD-10-CM | POA: Insufficient documentation

## 2015-09-04 DIAGNOSIS — Z8619 Personal history of other infectious and parasitic diseases: Secondary | ICD-10-CM | POA: Insufficient documentation

## 2015-09-04 DIAGNOSIS — Z79899 Other long term (current) drug therapy: Secondary | ICD-10-CM | POA: Insufficient documentation

## 2015-09-04 DIAGNOSIS — S92331A Displaced fracture of third metatarsal bone, right foot, initial encounter for closed fracture: Secondary | ICD-10-CM | POA: Insufficient documentation

## 2015-09-04 DIAGNOSIS — Y998 Other external cause status: Secondary | ICD-10-CM | POA: Insufficient documentation

## 2015-09-04 DIAGNOSIS — Y9389 Activity, other specified: Secondary | ICD-10-CM | POA: Insufficient documentation

## 2015-09-04 MED ORDER — HYDROCODONE-ACETAMINOPHEN 5-325 MG PO TABS: ORAL_TABLET | ORAL | Status: DC

## 2015-09-04 MED ORDER — HYDROCODONE-ACETAMINOPHEN 5-325 MG PO TABS
1.0000 | ORAL_TABLET | Freq: Once | ORAL | Status: AC
  Administered 2015-09-04: 1 via ORAL
  Filled 2015-09-04: qty 1

## 2015-09-04 MED ORDER — IBUPROFEN 200 MG PO TABS: 400.0000 mg | ORAL_TABLET | Freq: Once | ORAL | Status: DC

## 2015-09-04 NOTE — ED Notes (Signed)
Patient reports that he thought he was kicking a normal ball and kicked a weighted ball with his right foot. Patient c/o not being able to bear weight.

## 2015-09-04 NOTE — ED Notes (Signed)
Bed: WA18 Expected date:  Expected time:  Means of arrival:  Comments: 

## 2015-09-04 NOTE — ED Notes (Signed)
Ortho at bedside.

## 2015-09-04 NOTE — Discharge Instructions (Signed)
Do not bear any weight on the affected foot.  Keep the foot elevated above the level of the heart is much as possible.  Take vicodin for breakthrough pain, do not drink alcohol, drive, care for children or do other critical tasks while taking vicodin.  Follow with Dr. Alma Friendly tomorrow.  If the pain increases, if he have pain with movement of the toes, if the toes turn dusky or cold return immediately to the emergency room for recheck.

## 2015-09-04 NOTE — ED Provider Notes (Signed)
CSN: [id number]     Arrival date & time 09/04/15  1516 History   First MD Initiated Contact with Patient 09/04/15 1542     Chief Complaint  Patient presents with  . Foot Injury     (Consider location/radiation/quality/duration/timing/severity/associated sxs/prior Treatment) HPI   Blood pressure 132/90, pulse 95, temperature 98.3 F (36.8 C), temperature source Oral, resp. rate 16, height 5\' 8"  (1.727 m), weight 97.523 kg, SpO2 99 %.  Michael Rogers is a 30 y.o. male complaining of complaining of pain to lateral right foot after patient kicked a ball which she thought was full of air but was actually a medicine ball. Patient is not been able to bear weight on the foot since that time. No pain medication taken prior to arrival, pain is rated at 9 out of 10, exacerbated by weightbearing. No history of trauma or surgeries to the affected joint. Patient follows with Dr. Alma Friendly for unrelated issue, has appointment with him tomorrow.   Past Medical History  Diagnosis Date  . Asthma   . Allergy   . H. pylori infection    History reviewed. No pertinent past surgical history. Family History  Problem Relation Age of Onset  . Kidney disease Father   . Hypertension Paternal Grandmother    Social History  Substance Use Topics  . Smoking status: Former Smoker    Start date: 03/17/2013  . Smokeless tobacco: Never Used  . Alcohol Use: No    Review of Systems  10 systems reviewed and found to be negative, except as noted in the HPI.   Allergies  Dust mite extract and Other  Home Medications   Prior to Admission medications   Medication Sig Start Date End Date Taking? Authorizing Provider  albuterol (PROVENTIL HFA;VENTOLIN HFA) 108 (90 BASE) MCG/ACT inhaler Inhale 2 puffs into the lungs every 6 (six) hours as needed for wheezing or shortness of breath. Patient not taking: Reported on 02/06/2015 04/19/14   Leone Brand, MD  amoxicillin (AMOXIL) 500 MG capsule Take 1 capsule (500 mg  total) by mouth 3 (three) times daily. Patient not taking: Reported on 08/31/2015 02/25/15   Delos Haring, PA-C  cetirizine (ZYRTEC) 10 MG tablet Take 1 tablet (10 mg total) by mouth daily. Patient taking differently: Take 10 mg by mouth daily as needed for allergies or rhinitis.  04/19/14   Leone Brand, MD  methocarbamol (ROBAXIN) 500 MG tablet Take 1 tablet (500 mg total) by mouth 2 (two) times daily. 07/07/15   Marella Chimes, PA-C  naproxen (NAPROSYN) 500 MG tablet Take 1 tablet (500 mg total) by mouth 2 (two) times daily with a meal. 07/07/15   Marella Chimes, PA-C  Phenylephrine-Pheniramine-DM [facility] COLD & COUGH PO) Take 30 mLs by mouth every 4 (four) hours as needed (cold symptoms.).    Historical Provider, MD  traMADol (ULTRAM) 50 MG tablet Take 1 tablet (50 mg total) by mouth every 6 (six) hours as needed. 02/25/15   Tiffany Carlota Raspberry, PA-C   BP 132/90 mmHg  Pulse 95  Temp(Src) 98.3 F (36.8 C) (Oral)  Resp 16  Ht 5\' 8"  (1.727 m)  Wt 97.523 kg  BMI 32.70 kg/m2  SpO2 99% Physical Exam  Constitutional: He is oriented to person, place, and time. He appears well-developed and well-nourished. No distress.  HENT:  Head: Normocephalic.  Eyes: Conjunctivae and EOM are normal.  Cardiovascular: Normal rate.   Pulmonary/Chest: Effort normal. No stridor.  Musculoskeletal: Normal range of motion. He exhibits tenderness.  Feet:  DP and PT pulses are 2+, patient has excellent range of motion to toes, no tenderness palpation along the bilateral malleolus. Patient is tender to palpation as diagrammed, compartments are soft.  Neurological: He is alert and oriented to person, place, and time.  Psychiatric: He has a normal mood and affect.  Nursing note and vitals reviewed.   ED Course  Procedures (including critical care time) SPLINT APPLICATION Date/Time: 123456 PM Authorized by: Monico Blitz Consent: Verbal consent obtained. Risks and benefits: risks, benefits and  alternatives were discussed Consent given by: patient Splint applied by: orthopedic technician Location details: Right Foot Splint type: short-leg splint  Supplies  Ortho-Glass  Post-procedure: The splinted body part was neurovascularly unchanged following the procedure. Patient tolerance: Patient tolerated the procedure well with no immediate complications.   Labs Review Labs Reviewed - No data to display  Imaging Review No results found. I have personally reviewed and evaluated these images and lab results as part of my medical decision-making.   EKG Interpretation None      MDM   Final diagnoses:  Closed displaced fracture of third metatarsal bone, right, initial encounter  Closed fracture of second metatarsal bone, right, initial encounter    Filed Vitals:   09/04/15 1525 09/04/15 1717  BP: 132/90 131/74  Pulse: 95 71  Temp: 98.3 F (36.8 C)   TempSrc: Oral   Resp: 16 18  Height: 5\' 8"  (1.727 m)   Weight: 97.523 kg   SpO2: 99% 98%    Medications  HYDROcodone-acetaminophen (NORCO/VICODIN) 5-325 MG per tablet 1 tablet (1 tablet Oral Given 09/04/15 1637)    Michael Rogers is 30 y.o. male presenting with pain to right lateral foot after kicking what he thought to be a regular bowel, was incidentally a weighted ball. This is a closed injury, patient is neurovascularly intact, compartments are soft. The x-ray shows a displaced third meta-tarsal fracture and a nondisplaced second. Discussed with attending, we have reviewed the films, patient will be placed in a splint, given crutches, advised nonweightbearing, advised aggressive elevation. Patient has an appointment with orthopedist Dr. Alma Friendly for unrelated complaint tomorrow.  Evaluation does not show pathology that would require ongoing emergent intervention or inpatient treatment. Pt is hemodynamically stable and mentating appropriately. Discussed findings and plan with patient/guardian, who agrees with care plan. All  questions answered. Return precautions discussed and outpatient follow up given.   Discharge Medication List as of 09/04/2015  5:13 PM    START taking these medications   Details  HYDROcodone-acetaminophen (NORCO/VICODIN) 5-325 MG tablet Take 1-2 tablets by mouth every 6 hours as needed for pain and/or cough., Print             Monico Blitz, PA-C 09/04/15 [place], MD 09/04/15 2252

## 2015-09-05 ENCOUNTER — Ambulatory Visit: Payer: No Typology Code available for payment source | Admitting: Physical Therapy

## 2015-09-05 NOTE — ED Notes (Signed)
Pt's chart accessed (09/05/15 at 1630) to print a work note.

## 2016-02-28 ENCOUNTER — Ambulatory Visit (INDEPENDENT_AMBULATORY_CARE_PROVIDER_SITE_OTHER): Payer: Self-pay | Admitting: Internal Medicine

## 2016-02-28 ENCOUNTER — Encounter: Payer: Self-pay | Admitting: Internal Medicine

## 2016-02-28 DIAGNOSIS — S91331A Puncture wound without foreign body, right foot, initial encounter: Secondary | ICD-10-CM

## 2016-02-28 HISTORY — DX: Puncture wound without foreign body, right foot, initial encounter: S91.331A

## 2016-02-28 NOTE — Patient Instructions (Signed)
It was so nice to meet you!  You do not need a tetanus shot today because you had one in 2015. You do not have a deep puncture wound so I don't think you need the other tetanus shot today either.  If you notice any spreading redness, abscess, or if you start having fevers or chills, please come back to see [place]. These can be signs of an infection.  -Dr. Brett Albino

## 2016-02-28 NOTE — Assessment & Plan Note (Signed)
Pt stepped on a nail at work that went through his boot and hit his foot. No signs of a puncture wound on exam. No signs of infection. Pt just had a tetanus shot in 2015. - No need for repeat tetanus shot today - No need for tetanus immune globulin, as this is only used patients who have not been adequately vaccinated - Return precautions given - Follow-up as needed

## 2016-02-28 NOTE — Progress Notes (Signed)
   [facility] Phone: [phone]  Subjective:  Pt presents after stepping on nail yesterday at work. He works Architect. The nail went through his boot and hit his right foot. When he pulled his boot off, he noticed a very small amount of blood. The foot has now healed and he can't even see the mark where the nail went in. He denies any fevers, chills, spreading redness, nausea, vomiting.  ROS: See HPI for pertinent positives and negatives Past Medical History- asthma, hx H. Pylori infection Reviewed problem list.  Medications- reviewed and updated Current Outpatient Prescriptions  Medication Sig Dispense Refill  . albuterol (PROVENTIL HFA;VENTOLIN HFA) 108 (90 BASE) MCG/ACT inhaler Inhale 2 puffs into the lungs every 6 (six) hours as needed for wheezing or shortness of breath. (Patient not taking: Reported on 02/06/2015) 1 Inhaler 2  . amoxicillin (AMOXIL) 500 MG capsule Take 1 capsule (500 mg total) by mouth 3 (three) times daily. (Patient not taking: Reported on 08/31/2015) 21 capsule 0  . cetirizine (ZYRTEC) 10 MG tablet Take 1 tablet (10 mg total) by mouth daily. (Patient not taking: Reported on 09/04/2015) 30 tablet 11  . HYDROcodone-acetaminophen (NORCO/VICODIN) 5-325 MG tablet Take 1-2 tablets by mouth every 6 hours as needed for pain and/or cough. 11 tablet 0  . methocarbamol (ROBAXIN) 500 MG tablet Take 1 tablet (500 mg total) by mouth 2 (two) times daily. (Patient not taking: Reported on 09/04/2015) 20 tablet 0  . naproxen (NAPROSYN) 500 MG tablet Take 1 tablet (500 mg total) by mouth 2 (two) times daily with a meal. (Patient not taking: Reported on 09/04/2015) 30 tablet 0  . Phenylephrine-Ibuprofen (CONGESTION RELIEF PO) Take 1 tablet by mouth daily as needed (congestion).    . Pseudoeph-Doxylamine-DM-APAP (NYQUIL PO) Take 30 mLs by mouth every 4 (four) hours as needed (cold symptoms).    . Pseudoephedrine-APAP-DM (DAYQUIL PO) Take 30 mLs by mouth every 4 (four)  hours as needed (cold symptoms).    . traMADol (ULTRAM) 50 MG tablet Take 1 tablet (50 mg total) by mouth every 6 (six) hours as needed. (Patient not taking: Reported on 09/04/2015) 15 tablet 0   No current facility-administered medications for this visit.    Chief complaint-noted Family history reviewed for today's visit. No changes. Social history- patient is a current smoker  Objective: BP 124/70   Pulse (!) 57   Wt 201 lb (91.2 kg)   BMI 30.56 kg/m  Gen: NAD, alert, cooperative with exam Msk: Right foot without any puncture wound or lesion, no break in the skin, no swelling or erythema, no signs of injury, no bleeding  Assessment/Plan: Puncture wound to right foot: Pt stepped on a nail at work that went through his boot and hit his foot. No signs of a puncture wound on exam. No signs of infection. Pt just had a tetanus shot in 2015. - No need for repeat tetanus shot today - No need for tetanus immune globulin, as this is only used patients who have not been adequately vaccinated - Return precautions given - Follow-up as needed   Hyman Bible, MD PGY-2

## 2016-08-04 HISTORY — PX: COLONOSCOPY: SHX174

## 2016-10-29 DIAGNOSIS — Z79899 Other long term (current) drug therapy: Secondary | ICD-10-CM | POA: Insufficient documentation

## 2016-10-29 DIAGNOSIS — Z87891 Personal history of nicotine dependence: Secondary | ICD-10-CM | POA: Insufficient documentation

## 2016-10-29 DIAGNOSIS — J45909 Unspecified asthma, uncomplicated: Secondary | ICD-10-CM | POA: Insufficient documentation

## 2016-10-29 DIAGNOSIS — L02415 Cutaneous abscess of right lower limb: Secondary | ICD-10-CM | POA: Insufficient documentation

## 2016-10-30 ENCOUNTER — Emergency Department (HOSPITAL_COMMUNITY)
Admission: EM | Admit: 2016-10-30 | Discharge: 2016-10-30 | Disposition: A | Discharge status: Home / Self Care | Payer: Self-pay | Attending: Emergency Medicine | Admitting: Emergency Medicine

## 2016-10-30 ENCOUNTER — Encounter (HOSPITAL_COMMUNITY): Payer: Self-pay

## 2016-10-30 DIAGNOSIS — L02415 Cutaneous abscess of right lower limb: Secondary | ICD-10-CM

## 2016-10-30 DIAGNOSIS — L03115 Cellulitis of right lower limb: Secondary | ICD-10-CM

## 2016-10-30 MED ORDER — SULFAMETHOXAZOLE-TRIMETHOPRIM 800-160 MG PO TABS
1.0000 | ORAL_TABLET | Freq: Once | ORAL | Status: AC
  Administered 2016-10-30: 1 via ORAL
  Filled 2016-10-30: qty 1

## 2016-10-30 MED ORDER — LIDOCAINE HCL 1 % IJ SOLN
INTRAMUSCULAR | Status: AC
  Filled 2016-10-30: qty 20

## 2016-10-30 MED ORDER — BACITRACIN ZINC 500 UNIT/GM EX OINT
TOPICAL_OINTMENT | CUTANEOUS | Status: AC
  Administered 2016-10-30: 03:00:00
  Filled 2016-10-30: qty 2.7

## 2016-10-30 MED ORDER — SULFAMETHOXAZOLE-TRIMETHOPRIM 800-160 MG PO TABS: 1.0000 | ORAL_TABLET | Freq: Two times a day (BID) | ORAL | 0 refills | Status: AC

## 2016-10-30 MED ORDER — LIDOCAINE-EPINEPHRINE (PF) 2 %-1:200000 IJ SOLN
10.0000 mL | Freq: Once | INTRAMUSCULAR | Status: AC
  Administered 2016-10-30: 10 mL via INTRADERMAL
  Filled 2016-10-30: qty 20

## 2016-10-30 NOTE — ED Triage Notes (Signed)
Pt complains of an abscess on his right inner thigh, he has a hx

## 2016-10-30 NOTE — ED Provider Notes (Signed)
[facility] DEPT Provider Note   CSN: [id number] Arrival date & time: 10/29/16  2352  By signing my name below, I, Collene Leyden, attest that this documentation has been prepared under the direction and in the presence of Sherwood Gambler, MD. Electronically Signed: Collene Leyden, Scribe. 10/30/16. 1:56 AM.  History   Chief Complaint Chief Complaint  Patient presents with  . Recurrent Skin Infections    HPI Comments: Michael Rogers is a 31 y.o. male with a history of H. Pylori infection and tobacco abuse, who presents to the Emergency Department complaining of a sudden-onset "boil" to the right inner thigh that appeared a few weeks ago. Patient states he noticed the "boil" has increased in size in the past two day. Patient reports attempting to pop the "boil". Patient states he gets these "boils" intermittently, but has never had to come to the hospital for this problem. Patient reports associated pain and swelling. No modifying factors indicated. Patient denies any nausea, vomiting, fever, or drainage.   The history is provided by the patient. No language interpreter was used.    Past Medical History:  Diagnosis Date  . Allergy   . Asthma   . H. pylori infection     Patient Active Problem List   Diagnosis Date Noted  . Puncture wound of right foot 02/28/2016  . URI (upper respiratory infection) 04/19/2014  . Sprain of ankle, unspecified site 01/25/2014  . Abdominal pain, epigastric 04/07/2013  . Back pain 04/07/2013  . Tobacco abuse 02/20/2012  . Asthma, intermittent 01/21/2012  . H. pylori infection 01/21/2012  . Seasonal allergies 01/21/2012  . Chronic headache 01/21/2012    History reviewed. No pertinent surgical history.     Home Medications    Prior to Admission medications   Medication Sig Start Date End Date Taking? Authorizing Provider  albuterol (PROVENTIL HFA;VENTOLIN HFA) 108 (90 BASE) MCG/ACT inhaler Inhale 2 puffs into the lungs every 6 (six) hours  as needed for wheezing or shortness of breath. Patient not taking: Reported on 02/06/2015 04/19/14   Leone Brand, MD  amoxicillin (AMOXIL) 500 MG capsule Take 1 capsule (500 mg total) by mouth 3 (three) times daily. Patient not taking: Reported on 08/31/2015 02/25/15   Delos Haring, PA-C  cetirizine (ZYRTEC) 10 MG tablet Take 1 tablet (10 mg total) by mouth daily. Patient not taking: Reported on 09/04/2015 04/19/14   Leone Brand, MD  HYDROcodone-acetaminophen (NORCO/VICODIN) 5-325 MG tablet Take 1-2 tablets by mouth every 6 hours as needed for pain and/or cough. 09/04/15   Nicole Pisciotta, PA-C  methocarbamol (ROBAXIN) 500 MG tablet Take 1 tablet (500 mg total) by mouth 2 (two) times daily. Patient not taking: Reported on 09/04/2015 07/07/15   Marella Chimes, PA-C  naproxen (NAPROSYN) 500 MG tablet Take 1 tablet (500 mg total) by mouth 2 (two) times daily with a meal. Patient not taking: Reported on 09/04/2015 07/07/15   Marella Chimes, PA-C  Phenylephrine-Ibuprofen (CONGESTION RELIEF PO) Take 1 tablet by mouth daily as needed (congestion).    Historical Provider, MD  Pseudoeph-Doxylamine-DM-APAP (NYQUIL PO) Take 30 mLs by mouth every 4 (four) hours as needed (cold symptoms).    Historical Provider, MD  Pseudoephedrine-APAP-DM (DAYQUIL PO) Take 30 mLs by mouth every 4 (four) hours as needed (cold symptoms).    Historical Provider, MD  sulfamethoxazole-trimethoprim (BACTRIM DS,SEPTRA DS) 800-160 MG tablet Take 1 tablet by mouth 2 (two) times daily. 10/30/16 11/04/16  Sherwood Gambler, MD  traMADol (ULTRAM) 50 MG tablet Take  1 tablet (50 mg total) by mouth every 6 (six) hours as needed. Patient not taking: Reported on 09/04/2015 02/25/15   Delos Haring, PA-C    Family History Family History  Problem Relation Age of Onset  . Kidney disease Father   . Hypertension Paternal Grandmother     Social History Social History  Substance Use Topics  . Smoking status: Former Smoker    Start date:  03/17/2013  . Smokeless tobacco: Never Used  . Alcohol use No     Allergies   Dust mite extract and Other   Review of Systems Review of Systems  Constitutional: Negative for fever.  Gastrointestinal: Negative for nausea and vomiting.  Musculoskeletal: Positive for arthralgias (inner thigh).  Skin: Negative for color change and wound.       Abscess to the inner thigh.      Physical Exam Updated Vital Signs BP 132/86 (BP Location: Left Arm)   Pulse 63   Temp 98.2 F (36.8 C) (Oral)   Resp 18   Wt 199 lb (90.3 kg)   SpO2 99%   BMI 30.26 kg/m   Physical Exam  Constitutional: He is oriented to person, place, and time. He appears well-developed and well-nourished. No distress.  HENT:  Head: Normocephalic and atraumatic.  Eyes: Right eye exhibits no discharge. Left eye exhibits no discharge.  Pulmonary/Chest: Effort normal.  Musculoskeletal:  Approximately 2x1 superficial abscess in the right proximal medial thigh with mild surrounding erythema.  Neurological: He is alert and oriented to person, place, and time.  Skin: Skin is warm and dry. He is not diaphoretic. There is erythema.  Nursing note and vitals reviewed.    ED Treatments / Results  DIAGNOSTIC STUDIES: Oxygen Saturation is 99% on RA, normal by my interpretation.    COORDINATION OF CARE: 1:55 AM Discussed treatment plan with pt at bedside and pt agreed to plan.  Labs (all labs ordered are listed, but only abnormal results are displayed) Labs Reviewed - No data to display  EKG  EKG Interpretation None       Radiology No results found.  Procedures .Marland KitchenIncision and Drainage Date/Time: 10/30/2016 2:31 AM Performed by: Sherwood Gambler Authorized by: Sherwood Gambler   Consent:    Consent obtained:  Verbal   Consent given by:  Patient Location:    Type:  Abscess   Size:  2 cm   Location:  Lower extremity   Lower extremity location:  Leg   Leg location:  R upper leg Anesthesia (see MAR for  exact dosages):    Anesthesia method:  Local infiltration   Local anesthetic:  Lidocaine 2% WITH epi Procedure type:    Complexity:  Simple Procedure details:    Incision types:  Elliptical   Scalpel blade:  11   Wound management:  Probed and deloculated   Drainage:  Purulent   Drainage amount:  Moderate   Wound treatment:  Wound left open   Packing materials:  None Post-procedure details:    Patient tolerance of procedure:  Tolerated well, no immediate complications   (including critical care time)  Medications Ordered in ED Medications  sulfamethoxazole-trimethoprim (BACTRIM DS,SEPTRA DS) 800-160 MG per tablet 1 tablet (not administered)  lidocaine-EPINEPHrine (XYLOCAINE W/EPI) 2 %-1:200000 (PF) injection 10 mL (10 mLs Intradermal Given 10/30/16 0159)     Initial Impression / Assessment and Plan / ED Course  I have reviewed the triage vital signs and the nursing notes.  Pertinent labs & imaging results that were available during my  care of the patient were reviewed by me and considered in my medical decision making (see chart for details).     Small abscess with surrounding cellulitis. Overall appears well, no systemic signs/symptoms. Given surrounding cellulitis with a small abscess, will also give bactrim in addition to I&D. Discussed f/u with PCP and return precautions.  Final Clinical Impressions(s) / ED Diagnoses   Final diagnoses:  Abscess of right thigh  Cellulitis of right lower extremity    New Prescriptions New Prescriptions   SULFAMETHOXAZOLE-TRIMETHOPRIM (BACTRIM DS,SEPTRA DS) 800-160 MG TABLET    Take 1 tablet by mouth 2 (two) times daily.   I personally performed the services described in this documentation, which was scribed in my presence. The recorded information has been reviewed and is accurate.     Sherwood Gambler, MD 10/31/16 [phone]

## 2017-04-12 ENCOUNTER — Encounter (HOSPITAL_COMMUNITY): Payer: Self-pay

## 2017-04-12 ENCOUNTER — Emergency Department (HOSPITAL_COMMUNITY)
Admission: EM | Admit: 2017-04-12 | Discharge: 2017-04-12 | Disposition: A | Discharge status: Home / Self Care | Payer: Self-pay | Attending: Emergency Medicine | Admitting: Emergency Medicine

## 2017-04-12 DIAGNOSIS — K5900 Constipation, unspecified: Secondary | ICD-10-CM | POA: Insufficient documentation

## 2017-04-12 DIAGNOSIS — N410 Acute prostatitis: Secondary | ICD-10-CM | POA: Insufficient documentation

## 2017-04-12 DIAGNOSIS — J45909 Unspecified asthma, uncomplicated: Secondary | ICD-10-CM | POA: Insufficient documentation

## 2017-04-12 DIAGNOSIS — Z87891 Personal history of nicotine dependence: Secondary | ICD-10-CM | POA: Insufficient documentation

## 2017-04-12 MED ORDER — AZITHROMYCIN 250 MG PO TABS
1000.0000 mg | ORAL_TABLET | Freq: Once | ORAL | Status: AC
  Administered 2017-04-12: 1000 mg via ORAL
  Filled 2017-04-12: qty 4

## 2017-04-12 MED ORDER — DOXYCYCLINE HYCLATE 100 MG PO CAPS: 100.0000 mg | ORAL_CAPSULE | Freq: Two times a day (BID) | ORAL | 0 refills | Status: DC

## 2017-04-12 MED ORDER — CEFTRIAXONE SODIUM 250 MG IJ SOLR
250.0000 mg | Freq: Once | INTRAMUSCULAR | Status: AC
  Administered 2017-04-12: 250 mg via INTRAMUSCULAR
  Filled 2017-04-12: qty 250

## 2017-04-12 NOTE — ED Triage Notes (Signed)
Pt complains of groin pain that shoots to his rectum, the pain has been constant this week Pt states he felt nauseated this am  Pt denies painful urination, painful intercourse or discharge

## 2017-04-12 NOTE — ED Provider Notes (Signed)
[facility] DEPT Provider Note   CSN: [id number] Arrival date & time: 04/12/17  0031     History   Chief Complaint Chief Complaint  Patient presents with  . Groin Pain    HPI Michael Rogers is a 31 y.o. male.  31 yo M with a cc of rectal pain.  Going on for past couple of days.  Sharp shooting.  Lasts for seconds at a time.  Radiates to his perineum.  Denies trauma, denies fb.  Mild constipation last week.  Denies vomiting.    The history is provided by the patient.  Groin Pain  This is a new problem. The current episode started 2 days ago. The problem occurs constantly. The problem has not changed since onset.Pertinent negatives include no chest pain, no abdominal pain, no headaches and no shortness of breath. Nothing aggravates the symptoms. Nothing relieves the symptoms. He has tried nothing for the symptoms. The treatment provided no relief.    Past Medical History:  Diagnosis Date  . Allergy   . Asthma   . H. pylori infection     Patient Active Problem List   Diagnosis Date Noted  . Puncture wound of right foot 02/28/2016  . URI (upper respiratory infection) 04/19/2014  . Sprain of ankle, unspecified site 01/25/2014  . Abdominal pain, epigastric 04/07/2013  . Back pain 04/07/2013  . Tobacco abuse 02/20/2012  . Asthma, intermittent 01/21/2012  . H. pylori infection 01/21/2012  . Seasonal allergies 01/21/2012  . Chronic headache 01/21/2012    History reviewed. No pertinent surgical history.     Home Medications    Prior to Admission medications   Medication Sig Start Date End Date Taking? Authorizing Provider  albuterol (PROVENTIL HFA;VENTOLIN HFA) 108 (90 BASE) MCG/ACT inhaler Inhale 2 puffs into the lungs every 6 (six) hours as needed for wheezing or shortness of breath. Patient not taking: Reported on 02/06/2015 04/19/14   Leone Brand, MD  amoxicillin (AMOXIL) 500 MG capsule Take 1 capsule (500 mg total) by mouth 3 (three) times daily. Patient  not taking: Reported on 08/31/2015 02/25/15   Delos Haring, PA-C  cetirizine (ZYRTEC) 10 MG tablet Take 1 tablet (10 mg total) by mouth daily. Patient not taking: Reported on 09/04/2015 04/19/14   Leone Brand, MD  doxycycline (VIBRAMYCIN) 100 MG capsule Take 1 capsule (100 mg total) by mouth 2 (two) times daily. One po bid x 7 days 04/12/17   Deno Etienne, DO  HYDROcodone-acetaminophen (NORCO/VICODIN) 5-325 MG tablet Take 1-2 tablets by mouth every 6 hours as needed for pain and/or cough. Patient not taking: Reported on 04/12/2017 09/04/15   Pisciotta, Elmyra Ricks, PA-C  methocarbamol (ROBAXIN) 500 MG tablet Take 1 tablet (500 mg total) by mouth 2 (two) times daily. Patient not taking: Reported on 09/04/2015 07/07/15   Marella Chimes, PA-C  naproxen (NAPROSYN) 500 MG tablet Take 1 tablet (500 mg total) by mouth 2 (two) times daily with a meal. Patient not taking: Reported on 09/04/2015 07/07/15   Marella Chimes, PA-C  traMADol (ULTRAM) 50 MG tablet Take 1 tablet (50 mg total) by mouth every 6 (six) hours as needed. Patient not taking: Reported on 09/04/2015 02/25/15   Delos Haring, PA-C    Family History Family History  Problem Relation Age of Onset  . Kidney disease Father   . Hypertension Paternal Grandmother     Social History Social History  Substance Use Topics  . Smoking status: Former Smoker    Start date: 03/17/2013  .  Smokeless tobacco: Never Used  . Alcohol use No     Allergies   Dust mite extract and Other   Review of Systems Review of Systems  Constitutional: Negative for chills and fever.  HENT: Negative for congestion and facial swelling.   Eyes: Negative for discharge and visual disturbance.  Respiratory: Negative for shortness of breath.   Cardiovascular: Negative for chest pain and palpitations.  Gastrointestinal: Positive for constipation. Negative for abdominal pain, diarrhea and vomiting.  Musculoskeletal: Negative for arthralgias and myalgias.  Skin:  Negative for color change and rash.  Neurological: Negative for tremors, syncope and headaches.  Psychiatric/Behavioral: Negative for confusion and dysphoric mood.     Physical Exam Updated Vital Signs BP 127/73 (BP Location: Left Arm)   Pulse 81   Temp 98.5 F (36.9 C) (Oral)   Resp 18   SpO2 100%   Physical Exam  Constitutional: He is oriented to person, place, and time. He appears well-developed and well-nourished.  HENT:  Head: Normocephalic and atraumatic.  Eyes: Pupils are equal, round, and reactive to light. EOM are normal.  Neck: Normal range of motion. Neck supple. No JVD present.  Cardiovascular: Normal rate and regular rhythm.  Exam reveals no gallop and no friction rub.   No murmur heard. Pulmonary/Chest: No respiratory distress. He has no wheezes.  Abdominal: He exhibits no distension. There is no rebound and no guarding. Hernia confirmed negative in the right inguinal area and confirmed negative in the left inguinal area.  Genitourinary: Cremasteric reflex is present. Right testis shows no mass, no swelling and no tenderness. Left testis shows no mass, no swelling and no tenderness. Circumcised. No hypospadias, penile erythema or penile tenderness. No discharge found.  Genitourinary Comments: Tender to palpation about the prostate. Mildly enlarged for what I would expect.  Musculoskeletal: Normal range of motion.  Lymphadenopathy: No inguinal adenopathy noted on the right or left side.  Neurological: He is alert and oriented to person, place, and time.  Skin: No rash noted. No pallor.  Psychiatric: He has a normal mood and affect. His behavior is normal.  Nursing note and vitals reviewed.    ED Treatments / Results  Labs (all labs ordered are listed, but only abnormal results are displayed) Labs Reviewed - No data to display  EKG  EKG Interpretation None       Radiology No results found.  Procedures Procedures (including critical care  time)  Medications Ordered in ED Medications  cefTRIAXone (ROCEPHIN) injection 250 mg (250 mg Intramuscular Given 04/12/17 0241)  azithromycin (ZITHROMAX) tablet 1,000 mg (1,000 mg Oral Given 04/12/17 0239)     Initial Impression / Assessment and Plan / ED Course  I have reviewed the triage vital signs and the nursing notes.  Pertinent labs & imaging results that were available during my care of the patient were reviewed by me and considered in my medical decision making (see chart for details).     31 yo M With a chief complaint of rectal pain that radiates across perineum. Going on for the past couple days. Occurs for seconds at a time. No tenderness to his penis or testicles. Does have tenderness but prostate. Will treat for prostatitis. Discharge home.  2:44 AM:  I have discussed the diagnosis/risks/treatment options with the patient and family and believe the pt to be eligible for discharge home to follow-up with Urology. We also discussed returning to the ED immediately if new or worsening sx occur. We discussed the sx which are most  concerning (e.g., sudden worsening pain, fever, inability to tolerate by mouth) that necessitate immediate return. Medications administered to the patient during their visit and any new prescriptions provided to the patient are listed below.  Medications given during this visit Medications  cefTRIAXone (ROCEPHIN) injection 250 mg (250 mg Intramuscular Given 04/12/17 0241)  azithromycin (ZITHROMAX) tablet 1,000 mg (1,000 mg Oral Given 04/12/17 0239)     The patient appears reasonably screen and/or stabilized for discharge and I doubt any other medical condition or other [facility] requiring further screening, evaluation, or treatment in the ED at this time prior to discharge.    Final Clinical Impressions(s) / ED Diagnoses   Final diagnoses:  Acute prostatitis    New Prescriptions New Prescriptions   DOXYCYCLINE (VIBRAMYCIN) 100 MG CAPSULE    Take 1 capsule  (100 mg total) by mouth 2 (two) times daily. One po bid x 7 days     Deno Etienne, DO 04/12/17 3790

## 2017-04-12 NOTE — Discharge Instructions (Signed)
Follow up with urology.  Try miralax at home in case this is constipation.  Return for fever, sudden worsening pain. Discuss this with your family doc, as if this doesn't get better they may need to widen the testing.

## 2017-04-29 DIAGNOSIS — S31119A Laceration without foreign body of abdominal wall, unspecified quadrant without penetration into peritoneal cavity, initial encounter: Secondary | ICD-10-CM | POA: Insufficient documentation

## 2017-04-29 DIAGNOSIS — Y999 Unspecified external cause status: Secondary | ICD-10-CM | POA: Insufficient documentation

## 2017-04-29 DIAGNOSIS — W268XXA Contact with other sharp object(s), not elsewhere classified, initial encounter: Secondary | ICD-10-CM | POA: Insufficient documentation

## 2017-04-29 DIAGNOSIS — Y9389 Activity, other specified: Secondary | ICD-10-CM | POA: Insufficient documentation

## 2017-04-29 DIAGNOSIS — Z79899 Other long term (current) drug therapy: Secondary | ICD-10-CM | POA: Insufficient documentation

## 2017-04-29 DIAGNOSIS — Z87891 Personal history of nicotine dependence: Secondary | ICD-10-CM | POA: Insufficient documentation

## 2017-04-29 DIAGNOSIS — J45909 Unspecified asthma, uncomplicated: Secondary | ICD-10-CM | POA: Insufficient documentation

## 2017-04-29 DIAGNOSIS — Z23 Encounter for immunization: Secondary | ICD-10-CM | POA: Insufficient documentation

## 2017-04-29 DIAGNOSIS — Y92009 Unspecified place in unspecified non-institutional (private) residence as the place of occurrence of the external cause: Secondary | ICD-10-CM | POA: Insufficient documentation

## 2017-04-30 ENCOUNTER — Encounter (HOSPITAL_COMMUNITY): Payer: Self-pay | Admitting: *Deleted

## 2017-04-30 ENCOUNTER — Emergency Department (HOSPITAL_COMMUNITY)
Admission: EM | Admit: 2017-04-30 | Discharge: 2017-04-30 | Disposition: A | Discharge status: Home / Self Care | Payer: Self-pay | Attending: Emergency Medicine | Admitting: Emergency Medicine

## 2017-04-30 DIAGNOSIS — S31119A Laceration without foreign body of abdominal wall, unspecified quadrant without penetration into peritoneal cavity, initial encounter: Secondary | ICD-10-CM

## 2017-04-30 MED ORDER — TETANUS-DIPHTH-ACELL PERTUSSIS 5-2.5-18.5 LF-MCG/0.5 IM SUSP
0.5000 mL | Freq: Once | INTRAMUSCULAR | Status: AC
  Administered 2017-04-30: 0.5 mL via INTRAMUSCULAR
  Filled 2017-04-30: qty 0.5

## 2017-04-30 MED ORDER — BACITRACIN ZINC 500 UNIT/GM EX OINT: 1.0000 "application " | TOPICAL_OINTMENT | Freq: Two times a day (BID) | CUTANEOUS | 0 refills | Status: DC

## 2017-04-30 MED ORDER — LIDOCAINE-EPINEPHRINE (PF) 2 %-1:200000 IJ SOLN
10.0000 mL | Freq: Once | INTRAMUSCULAR | Status: AC
  Administered 2017-04-30: 10 mL
  Filled 2017-04-30: qty 20

## 2017-04-30 NOTE — ED Provider Notes (Addendum)
[facility] DEPT Provider Note   CSN: [id number] Arrival date & time: 04/29/17  2357     History   Chief Complaint Chief Complaint  Patient presents with  . Laceration    HPI MARKHI KLECKNER is a 31 y.o. male who presents emergency department today for laceration to upper abdomen. Patient states that after getting off work he went to a friend's house to help fix a grinder that had a bent blade guard. He placed the grinder on a channel lock and while trying to bend the guard his friend accidentally pressed the on button causing the blade to jerk hitting his shirt, cutting through and causing the laceration. This occurred at approximately 12:30am this morning. Bleeding is well-controlled and bandage is in place.The patient is unsure of tetanus status.   HPI  Past Medical History:  Diagnosis Date  . Allergy   . Asthma   . H. pylori infection     Patient Active Problem List   Diagnosis Date Noted  . Puncture wound of right foot 02/28/2016  . URI (upper respiratory infection) 04/19/2014  . Sprain of ankle, unspecified site 01/25/2014  . Abdominal pain, epigastric 04/07/2013  . Back pain 04/07/2013  . Tobacco abuse 02/20/2012  . Asthma, intermittent 01/21/2012  . H. pylori infection 01/21/2012  . Seasonal allergies 01/21/2012  . Chronic headache 01/21/2012    History reviewed. No pertinent surgical history.     Home Medications    Prior to Admission medications   Medication Sig Start Date End Date Taking? Authorizing Provider  ciprofloxacin (CIPRO) 500 MG tablet Take 500 mg by mouth 2 (two) times daily.   Yes [provider]  meloxicam (MOBIC) 7.5 MG tablet Take 7.5 mg by mouth 2 (two) times daily.   Yes [provider]  tamsulosin (FLOMAX) 0.4 MG CAPS capsule Take 0.4 mg by mouth at bedtime.   Yes [provider]  albuterol (PROVENTIL HFA;VENTOLIN HFA) 108 (90 BASE) MCG/ACT inhaler Inhale 2 puffs into the lungs every 6 (six) hours as  needed for wheezing or shortness of breath. Patient not taking: Reported on 02/06/2015 04/19/14   Leone Brand, MD  bacitracin ointment Apply 1 application topically 2 (two) times daily. 04/30/17   Maczis, Barth Kirks, PA-C  doxycycline (VIBRAMYCIN) 100 MG capsule Take 1 capsule (100 mg total) by mouth 2 (two) times daily. One po bid x 7 days Patient not taking: Reported on 04/30/2017 04/12/17   Deno Etienne, DO    Family History Family History  Problem Relation Age of Onset  . Kidney disease Father   . Hypertension Paternal Grandmother     Social History Social History  Substance Use Topics  . Smoking status: Former Smoker    Start date: 03/17/2013  . Smokeless tobacco: Never Used  . Alcohol use No     Allergies   Dust mite extract and Other   Review of Systems Review of Systems  Gastrointestinal: Negative for abdominal pain, nausea and vomiting.  Skin: Positive for wound.  Allergic/Immunologic: Negative for immunocompromised state.  Neurological: Negative for numbness.     Physical Exam Updated Vital Signs BP 127/84   Pulse 60   Temp 98.4 F (36.9 C) (Oral)   Resp 18   SpO2 100%   Physical Exam  Constitutional: He appears well-developed and well-nourished.  HENT:  Head: Normocephalic and atraumatic.  Right Ear: External ear normal.  Left Ear: External ear normal.  Eyes: Conjunctivae are normal. Right eye exhibits no discharge. Left eye  exhibits no discharge. No scleral icterus.  Pulmonary/Chest: Effort normal. No respiratory distress.  Abdominal: Soft. Bowel sounds are normal. He exhibits no distension. There is no tenderness. There is no guarding.  Neurological: He is alert.  Skin: Skin is warm and dry. No pallor.  5cm x 2cm laceration to the mid-left abdomen. High tension area. Small amount of fat visualized. Bleeding controlled. No surrounding erythema.   Psychiatric: He has a normal mood and affect.  Nursing note and vitals reviewed.   ED Treatments /  Results  Labs (all labs ordered are listed, but only abnormal results are displayed) Labs Reviewed - No data to display  EKG  EKG Interpretation None       Radiology No results found.  Procedures .Marland KitchenLaceration Repair Date/Time: 04/30/2017 8:22 AM Performed by: Jillyn Ledger Authorized by: Jillyn Ledger   Consent:    Consent obtained:  Verbal   Consent given by:  Patient   Risks discussed:  Infection, pain, poor cosmetic result, poor wound healing, nerve damage and need for additional repair   Alternatives discussed:  No treatment and delayed treatment Anesthesia (see MAR for exact dosages):    Anesthesia method:  Local infiltration   Local anesthetic:  Lidocaine 2% WITH epi Laceration details:    Location:  Trunk   Trunk location:  LUQ abd   Length (cm):  5 Repair type:    Repair type:  Intermediate Pre-procedure details:    Preparation:  Patient was prepped and draped in usual sterile fashion Exploration:    Contaminated: no   Treatment:    Area cleansed with:  Shur-Clens and soap and water   Amount of cleaning:  Standard   Irrigation solution:  Sterile saline   Irrigation volume:  1022ml   Irrigation method:  Pressure wash   Visualized foreign bodies/material removed: no   Skin repair:    Repair method:  Sutures and staples   Suture size:  3-0   Wound skin closure material used: vircyl.   Suture technique:  Simple interrupted (deep)   Number of sutures:  4   Number of staples:  6 Post-procedure details:    Dressing:  Non-adherent dressing   Patient tolerance of procedure:  Tolerated well, no immediate complications   (including critical care time)  Medications Ordered in ED Medications  Tdap (BOOSTRIX) injection 0.5 mL (0.5 mLs Intramuscular Given 04/30/17 0826)  lidocaine-EPINEPHrine (XYLOCAINE W/EPI) 2 %-1:200000 (PF) injection 10 mL (10 mLs Infiltration Given by Other 04/30/17 3474)     Initial Impression / Assessment and Plan / ED Course  I  have reviewed the triage vital signs and the nursing notes.  Pertinent labs & imaging results that were available during my care of the patient were reviewed by me and considered in my medical decision making (see chart for details).     Patient with laceration to abdomen. Pressure irrigation performed. Wound explored and base of wound visualized in a bloodless field without evidence of foreign body.  Laceration occurred < 8 hours prior to repair which was well tolerated. Tdap updated.  Pt has  no comorbidities to effect normal wound healing. Pt discharged without antibiotics.  Discussed staple home care with patient and answered questions. Pt to follow-up for wound check and staple removal in 7-10 days; they are to return to the ED sooner for signs of infection. Pt is hemodynamically stable with no complaints prior to dc.   Patient case discussed with Dr. Rogene Houston who is in agreement with plan.  Final Clinical Impressions(s) / ED Diagnoses   Final diagnoses:  Laceration of abdominal wall, initial encounter    New Prescriptions New Prescriptions   BACITRACIN OINTMENT    Apply 1 application topically 2 (two) times daily.     Lorelle Gibbs 04/30/17 0370    Fredia Sorrow, MD 04/30/17 [phone]   Patient seen by me along with the physician assistant. Patient sustained a laceration around midnight from a grinding wheel to his left side of his abdomen.  Wound appears clean. Down to the subC fat. Skin folds have a made the wound into a gaping wound.  I recommended subcutaneous closure and then over closure with staples on top. Patient's tetanus not up-to-date will be updated. No other injuries. No concern for any entrance below the abdominal wall.    Fredia Sorrow, MD 04/30/17 [phone]

## 2017-04-30 NOTE — ED Notes (Signed)
pts wound has been dressed.

## 2017-04-30 NOTE — Discharge Instructions (Signed)
You were seen here today for a laceration that occurred to your abdomen. A laceration is a cut or lesion that goes through all layers of the skin and into the tissue just beneath the skin. This was repaired with staples. Follow up with your doctor, an urgent care, or this emergency department in 7 days for removal of you staples. Keep the laceration site dry for the next 24 hours and leave the dressing in place. After 24 hours you may remove the dressing and gently clean the laceration site with antibacterial soap and warm water. Do not scrub the area. Do not soak the area and water for long periods of time. Apply topical bacitracin 1-2 times per day for the next 7 days. Once the wound has healed, scarring can be minimized by covering the wound with sunscreen during the day for 1 full year. Follow up 7-10 days for removal of staples.   SEEK MEDICAL CARE IF:  You have redness, swelling, or increasing pain in the wound.  You see a red line that goes away from the wound.  You have yellowish-white fluid (pus) coming from the wound.  You have a fever (above 100.62F) You notice a bad smell coming from the wound or dressing.  Your wound breaks open before or after sutures have been removed.  You notice something coming out of the wound such as wood or glass.  Your wound is on your hand or foot and you cannot move a finger or toe.  Your pain is not controlled with prescribed medicine.   Additional Information:  If you did not receive a tetanus shot today because you thought you were up to date, but did not recall when your last one was given, make sure to check with your primary caregiver to determine if you need one.   Your vital signs today were: BP 127/84    Pulse 60    Temp 98.4 F (36.9 C) (Oral)    Resp 18    SpO2 100%  If your blood pressure (BP) was elevated above 135/85 this visit, please have this repeated by your doctor within one month.

## 2017-04-30 NOTE — ED Triage Notes (Signed)
Pt reports he was fixing a grinder tonight d/t bent guard, when his friend accidentally pushed the on button causing the blade to "jerk" hitting him on his apron.  ~2 inches laceration noted on his upper abd.  No active bleeding noted.

## 2017-05-08 ENCOUNTER — Ambulatory Visit: Payer: Self-pay | Admitting: *Deleted

## 2017-05-08 ENCOUNTER — Ambulatory Visit: Payer: Self-pay | Admitting: Family Medicine

## 2017-05-08 DIAGNOSIS — Z4802 Encounter for removal of sutures: Secondary | ICD-10-CM

## 2017-05-08 NOTE — Progress Notes (Signed)
Pt presents today for Staple removal.  They were placed on 04/30/17  On patients abdomen by the ED.    Assessed wound/staples with Dr. Erin Hearing.  Wound looks good, no redness or evidence of infection.  Is slightly irritated where patient had placed the band aid covering the staples to prevent shirt from pulling on them.   Per Dr. Erin Hearing, remove every other staple and replace with steri strips.  Return next week for nurse visit to have the rest removed.   #3 staples removed and replaced with Steri Strips.  Pt tolerated well, no complications.  Appt made for 05/13/17 @ [facility], Michael Rogers, [place]

## 2017-05-13 ENCOUNTER — Ambulatory Visit (INDEPENDENT_AMBULATORY_CARE_PROVIDER_SITE_OTHER): Payer: Self-pay | Admitting: *Deleted

## 2017-05-13 DIAGNOSIS — Z4802 Encounter for removal of sutures: Secondary | ICD-10-CM

## 2017-05-13 NOTE — Progress Notes (Signed)
   Patient in nurse clinic for removal of staple. Patient was seen last week for staple removal but only three was removed, steri stirps was placed. Patient had total of 6 staples placed in the ED.  No swelling, drainage or redness. Three staples removed without difficultly. Advised patient on infection prevention and return precautions. Derl Barrow, RN

## 2017-05-28 ENCOUNTER — Encounter: Payer: Self-pay | Admitting: Family Medicine

## 2017-05-29 ENCOUNTER — Ambulatory Visit (INDEPENDENT_AMBULATORY_CARE_PROVIDER_SITE_OTHER): Payer: BLUE CROSS/BLUE SHIELD | Admitting: Family Medicine

## 2017-05-29 ENCOUNTER — Encounter: Payer: Self-pay | Admitting: Family Medicine

## 2017-05-29 ENCOUNTER — Telehealth: Payer: Self-pay | Admitting: *Deleted

## 2017-05-29 ENCOUNTER — Telehealth: Payer: Self-pay | Admitting: Family Medicine

## 2017-05-29 VITALS — BP 120/80 | HR 92 | Temp 98.2°F | Ht 68.0 in | Wt 190.0 lb

## 2017-05-29 DIAGNOSIS — N419 Inflammatory disease of prostate, unspecified: Secondary | ICD-10-CM

## 2017-05-29 DIAGNOSIS — K922 Gastrointestinal hemorrhage, unspecified: Secondary | ICD-10-CM | POA: Diagnosis not present

## 2017-05-29 DIAGNOSIS — R109 Unspecified abdominal pain: Secondary | ICD-10-CM | POA: Diagnosis not present

## 2017-05-29 HISTORY — DX: Inflammatory disease of prostate, unspecified: N41.9

## 2017-05-29 LAB — HEMOCCULT GUIAC POC 1CARD (OFFICE): Fecal Occult Blood, POC: NEGATIVE

## 2017-05-29 MED ORDER — PANTOPRAZOLE SODIUM 40 MG PO TBEC: 40.0000 mg | DELAYED_RELEASE_TABLET | Freq: Every day | ORAL | 1 refills | Status: DC

## 2017-05-29 NOTE — Telephone Encounter (Signed)
LM for patient to call back office. Michael Rogers,CMA

## 2017-05-29 NOTE — Telephone Encounter (Signed)
I received a call on the after hours emergency line from patient regarding a missed phone call this afternoon from  at [facility]. Patient was unclear about the message left on his voicemail. He was seen this morning by Dr. Gwendlyn Deutscher for abdominal pain.  After reviewing patient's chart, I informed patient that call was to inform him about a Protonix prescription that was sent to his pharmacy and that was ready for pick up. Patient should start medication until his goes to his GI appointment on Tuesday. Patient appreciative of the call and states that he will pick up his prescription today.  Marjie Skiff, MD [place], PGY-2

## 2017-05-29 NOTE — Assessment & Plan Note (Signed)
Advised to help get record from his urologist. F/U with urologist as planned.

## 2017-05-29 NOTE — Assessment & Plan Note (Signed)
Mostly periumbilical. However, abdominal exam is benign. Rectal exam normal with neg FOBT. However, his stool picture showed obvious blood stained stool. Recheck H.Pylori. Urea breath test done today. Will contact him with result. Start on PPI while awaiting result. I did referral to GI given his hx and bleeding. Return precaution discussed.

## 2017-05-29 NOTE — Addendum Note (Signed)
Addended by: Andrena Mews T on: 05/29/2017 03:06 PM   Modules accepted: Orders

## 2017-05-29 NOTE — Patient Instructions (Signed)

## 2017-05-29 NOTE — Progress Notes (Signed)
Subjective:     Patient ID: Michael Rogers, male   DOB: 1986-05-10, 31 y.o.   MRN: [id number]  GI Problem  The primary symptoms include abdominal pain, nausea, diarrhea and hematochezia. Primary symptoms do not include fever, vomiting or rash. Primary symptoms comment: He had constipation for few days and he moved his bowel last night and has been running since then. There is blood in his stool. he came with is stool picture on his phone.. Episode onset: Periumbilical pain x 3 days now worsening. he had similar pain years ago when he was diagnosed with H.pylori. He completed treatment for that since he felt better after treatment. The onset was gradual. The problem has been gradually worsening.  The illness is also significant for bloating and constipation. The illness does not include anorexia or dysphagia. Associated symptoms comments: Anal pain. Significant associated medical issues include GERD and PUD. Associated medical issues do not include alcohol abuse, irritable bowel syndrome or hemorrhoids.  Prostatitis: Patient stated he was seen by urologist recently for prostatitis who started him on Flomax and antibiotic which he has been compliant with . His symptoms include + hesitance, urine frequency, he denies dysuria. Last urology appointment was 6 weeks ago and he has up coming appointment Nov 10th. No new concern.  Current Outpatient Prescriptions on File Prior to Visit  Medication Sig Dispense Refill  . ciprofloxacin (CIPRO) 500 MG tablet Take 500 mg by mouth 2 (two) times daily.    Marland Kitchen doxycycline (VIBRAMYCIN) 100 MG capsule Take 1 capsule (100 mg total) by mouth 2 (two) times daily. One po bid x 7 days 14 capsule 0  . tamsulosin (FLOMAX) 0.4 MG CAPS capsule Take 0.4 mg by mouth at bedtime.    Marland Kitchen albuterol (PROVENTIL HFA;VENTOLIN HFA) 108 (90 BASE) MCG/ACT inhaler Inhale 2 puffs into the lungs every 6 (six) hours as needed for wheezing or shortness of breath. (Patient not taking: Reported on  02/06/2015) 1 Inhaler 2  . meloxicam (MOBIC) 7.5 MG tablet Take 7.5 mg by mouth 2 (two) times daily.     No current facility-administered medications on file prior to visit.    Past Medical History:  Diagnosis Date  . Allergy   . Asthma   . H. pylori infection   . Puncture wound of right foot 02/28/2016     Review of Systems  Constitutional: Negative for fever.  Respiratory: Negative.   Cardiovascular: Negative.   Gastrointestinal: Positive for abdominal pain, bloating, constipation, diarrhea, hematochezia and nausea. Negative for anorexia, dysphagia and vomiting.  Genitourinary: Positive for frequency. Negative for decreased urine volume, hematuria, penile swelling and scrotal swelling.  Skin: Negative for rash.  All other systems reviewed and are negative.      Objective:   Physical Exam  Constitutional: He is oriented to person, place, and time. He appears well-developed. No distress.  Cardiovascular: Normal rate, regular rhythm and normal heart sounds.   No murmur heard. Pulmonary/Chest: Effort normal and breath sounds normal. No respiratory distress. He has no wheezes.  Abdominal: Soft. Bowel sounds are normal. He exhibits no distension and no mass. There is no tenderness. There is no guarding.  Genitourinary: Rectal exam shows no external hemorrhoid, no internal hemorrhoid, no fissure, no mass, no tenderness and guaiac negative stool. Prostate is not enlarged and not tender.  Neurological: He is alert and oriented to person, place, and time.  Nursing note and vitals reviewed.        Assessment:     Abdominal pain  Prostatitis    Plan:     Check problem list.

## 2017-05-29 NOTE — Telephone Encounter (Signed)
-----   Message from [name], MD sent at 05/29/2017  3:06 PM EDT ----- Please advise patient that I escribed Protonix to the pharmacy for him. Start as soon as possible.

## 2017-05-30 LAB — CBC WITH DIFFERENTIAL/PLATELET
BASOS: 1 %
Basophils Absolute: 0 10*3/uL (ref 0.0–0.2)
EOS (ABSOLUTE): 0.2 10*3/uL (ref 0.0–0.4)
EOS: 3 %
HEMATOCRIT: 41.4 % (ref 37.5–51.0)
Hemoglobin: 14.2 g/dL (ref 13.0–17.7)
IMMATURE GRANS (ABS): 0 10*3/uL (ref 0.0–0.1)
IMMATURE GRANULOCYTES: 0 %
LYMPHS: 32 %
Lymphocytes Absolute: 1.9 10*3/uL (ref 0.7–3.1)
MCH: 33.2 pg — ABNORMAL HIGH (ref 26.6–33.0)
MCHC: 34.3 g/dL (ref 31.5–35.7)
MCV: 97 fL (ref 79–97)
Monocytes Absolute: 0.7 10*3/uL (ref 0.1–0.9)
Monocytes: 11 %
NEUTROS PCT: 53 %
Neutrophils Absolute: 3.2 10*3/uL (ref 1.4–7.0)
PLATELETS: 213 10*3/uL (ref 150–379)
RBC: 4.28 x10E6/uL (ref 4.14–5.80)
RDW: 13.2 % (ref 12.3–15.4)
WBC: 5.9 10*3/uL (ref 3.4–10.8)

## 2017-06-02 ENCOUNTER — Encounter: Payer: Self-pay | Admitting: Gastroenterology

## 2017-06-02 ENCOUNTER — Ambulatory Visit (INDEPENDENT_AMBULATORY_CARE_PROVIDER_SITE_OTHER): Payer: BLUE CROSS/BLUE SHIELD | Admitting: Gastroenterology

## 2017-06-02 VITALS — BP 122/66 | HR 60 | Ht 68.0 in | Wt 188.8 lb

## 2017-06-02 DIAGNOSIS — R195 Other fecal abnormalities: Secondary | ICD-10-CM | POA: Diagnosis not present

## 2017-06-02 DIAGNOSIS — K625 Hemorrhage of anus and rectum: Secondary | ICD-10-CM

## 2017-06-02 DIAGNOSIS — R194 Change in bowel habit: Secondary | ICD-10-CM | POA: Diagnosis not present

## 2017-06-02 MED ORDER — NA SULFATE-K SULFATE-MG SULF 17.5-3.13-1.6 GM/177ML PO SOLN: 1.0000 | Freq: Once | ORAL | 0 refills | Status: AC

## 2017-06-02 NOTE — Patient Instructions (Addendum)
You need to call your urologist about your recent diagnosis of prostatitis  You will be set up for a colonoscopy for change in bowels and rectal bleeding.  Normal BMI (Body Mass Index- based on height and weight) is between 19 and 25. Your BMI today is Body mass index is 28.71 kg/m. Marland Kitchen Please consider follow up  regarding your BMI with your Primary Care Provider.

## 2017-06-02 NOTE — Progress Notes (Signed)
HPI: This is a very pleasant 31 year old man who was referred to me by [name], MD  to evaluate change in bowels, rectal bleeding.    Chief complaint is change in bowels, rectal bleeding  Anus pain, groin pains and LLQ pains;   He went to see [facility]; told him he had prostatitis, was given cipro, meloxicam and tamsulosin for 6 week course and all the various pains improved for 2 weeks.  The pains pains all returned and so he stopped all the meds. That was 1.5 weeks ago.  He has not informed his urologist about stopping those medicines.  Says he had h. Pylori many years ago.  Was treated with antibiotics.,  His primary care physician recently drew an H. pylori breath test on him and those results are pending.  CBC was normal this past week, Hemoccult testing was also negative.  Very scattered historian.  He has had a change in bowel habits for at least the past 5 or 6 months.  He says since the summer his stool has been quite loose, he will go once to twice a day.  Sometimes with some mild urgency.  He has seen overt blood in his stool on occasion as well.  His weight has been overall stable    Review of systems: Pertinent positive and negative review of systems were noted in the above HPI section. All other review negative.   Past Medical History:  Diagnosis Date  . Allergy   . Asthma   . H. pylori infection   . Puncture wound of right foot 02/28/2016    Past Surgical History:  Procedure Laterality Date  . ROOT CANAL      Current Outpatient Prescriptions  Medication Sig Dispense Refill  . pantoprazole (PROTONIX) 40 MG tablet Take 1 tablet (40 mg total) by mouth daily. (Patient not taking: Reported on 06/02/2017) 30 tablet 1   No current facility-administered medications for this visit.     Allergies as of 06/02/2017 - Review Complete 06/02/2017  Allergen Reaction Noted  . Dust mite extract Itching 04/18/2014  . Other Itching and Other (See Comments)  01/21/2014    Family History  Problem Relation Age of Onset  . Kidney disease Father   . Hypertension Paternal Grandmother   . Heart disease Paternal Uncle   . Diabetes Maternal Aunt   . Colon cancer Neg Hx   . Stomach cancer Neg Hx     Social History   Social History  . Marital status: Single    Spouse name: N/A  . Number of children: 1  . Years of education: N/A   Occupational History  . [employer]    Social History Main Topics  . Smoking status: Former Smoker    Start date: 03/17/2013  . Smokeless tobacco: Never Used  . Alcohol use No  . Drug use: Yes    Types: Marijuana     Comment: daily  . Sexual activity: Yes    Partners: Female    Birth control/ protection: None   Other Topics Concern  . Not on file   Social History Narrative   Lives with mother.     History of incarceration in 2012.     Physical Exam: BP 122/66   Pulse 60   Ht 5\' 8"  (1.727 m)   Wt 188 lb 12.8 oz (85.6 kg)   BMI 28.71 kg/m  Constitutional: generally well-appearing Psychiatric: alert and oriented x3 Eyes: extraocular movements intact Mouth: oral pharynx moist, no  lesions Neck: supple no lymphadenopathy Cardiovascular: heart regular rate and rhythm Lungs: clear to auscultation bilaterally Abdomen: soft, nontender, nondistended, no obvious ascites, no peritoneal signs, normal bowel sounds Extremities: no lower extremity edema bilaterally Skin: no lesions on visible extremities Rectal exam: There was no stool in the vault, he had small external hemorrhoids, I could appreciate some small internal hemorrhoids as well, his prostate seemed somewhat large for his age I did not palpate it very deeply at all.  Assessment and plan: 31 y.o. male with change in bowels, blood in stool overtly for the past several months; also recently diagnosed with prostatitis by urologist  The first recommended very strongly that he contact his urologist about his prostatitis.  He was told he had prostatitis  and started on appropriate therapy for it including antibiotics and Flomax and pain medicine.  He was going to be on it for 6 weeks however he stopped after 2 weeks because his symptoms returned.  He never let the urologist know.  After this visit he is going to contact urology to discuss that.  As far as GI issues he is a bit of a scattered historian and there seems to be some overlap between his prostatitis history and bowel history.  It does seem that he has had loose stools for several months, this is clearly a change for him.  He has also seen overt red bleeding at times.  For these issues I recommend a colonoscopy at his soonest convenience however I would like it to be delayed by 3 or 4 weeks to allow further treatment for his prostatitis.     Please see the "Patient Instructions" section for addition details about the plan.   Owens Loffler, MD [facility] 06/02/2017, 10:05 AM  Cc: [name], MD

## 2017-06-03 LAB — H. PYLORI BREATH TEST: H pylori Breath Test: NEGATIVE

## 2017-06-04 ENCOUNTER — Telehealth: Payer: Self-pay

## 2017-06-04 NOTE — Telephone Encounter (Signed)
-----   Message from [name], MD sent at 06/03/2017  3:46 PM EDT ----- Please call to inform patient that his H.Pylori test is neg.

## 2017-06-04 NOTE — Telephone Encounter (Signed)
Pt contacted and informed of negative H Pylori test, pt was very appreciative and voiced understanding.

## 2017-06-24 ENCOUNTER — Ambulatory Visit (AMBULATORY_SURGERY_CENTER): Payer: BLUE CROSS/BLUE SHIELD | Admitting: Gastroenterology

## 2017-06-24 ENCOUNTER — Other Ambulatory Visit: Payer: Self-pay

## 2017-06-24 ENCOUNTER — Encounter: Payer: Self-pay | Admitting: Gastroenterology

## 2017-06-24 VITALS — BP 105/71 | HR 87 | Temp 98.7°F | Resp 13 | Ht 68.0 in | Wt 188.0 lb

## 2017-06-24 DIAGNOSIS — K635 Polyp of colon: Secondary | ICD-10-CM

## 2017-06-24 DIAGNOSIS — R194 Change in bowel habit: Secondary | ICD-10-CM | POA: Diagnosis not present

## 2017-06-24 DIAGNOSIS — D125 Benign neoplasm of sigmoid colon: Secondary | ICD-10-CM

## 2017-06-24 MED ORDER — SODIUM CHLORIDE 0.9 % IV SOLN: 500.0000 mL | INTRAVENOUS | Status: DC

## 2017-06-24 NOTE — Op Note (Signed)
[facility] Patient Name: Michael Rogers Procedure Date: 06/24/2017 9:40 AM MRN: [id number] Endoscopist: Milus Banister , MD Age: 31 Referring MD:  Date of Birth: 25-Jun-1986 Gender: Male Account #: [id number] Procedure:                Colonoscopy Indications:              Change in bowel habits Medicines:                Monitored Anesthesia Care Procedure:                Pre-Anesthesia Assessment:                           - Prior to the procedure, a History and Physical                            was performed, and patient medications and                            allergies were reviewed. The patient's tolerance of                            previous anesthesia was also reviewed. The risks                            and benefits of the procedure and the sedation                            options and risks were discussed with the patient.                            All questions were answered, and informed consent                            was obtained. Prior Anticoagulants: The patient has                            taken no previous anticoagulant or antiplatelet                            agents. ASA Grade Assessment: II - A patient with                            mild systemic disease. After reviewing the risks                            and benefits, the patient was deemed in                            satisfactory condition to undergo the procedure.                           After obtaining informed consent, the colonoscope  was passed under direct vision. Throughout the                            procedure, the patient's blood pressure, pulse, and                            oxygen saturations were monitored continuously. The                            Colonoscope was introduced through the anus and                            advanced to the the cecum, identified by                            appendiceal orifice and ileocecal valve. The                            colonoscopy was performed without difficulty. The                            patient tolerated the procedure well. The quality                            of the bowel preparation was good. The ileocecal                            valve, appendiceal orifice, and rectum were                            photographed. Scope In: 9:45:57 AM Scope Out: 9:57:02 AM Scope Withdrawal Time: 0 hours 8 minutes 39 seconds  Total Procedure Duration: 0 hours 11 minutes 5 seconds  Findings:                 A 7 mm polyp was found in the sigmoid colon. The                            polyp was sessile. The polyp was removed with a hot                            snare. Resection and retrieval were complete.                           The exam was otherwise without abnormality on                            direct and retroflexion views. Complications:            No immediate complications. Estimated blood loss:                            None. Estimated Blood Loss:     Estimated blood loss: none. Impression:               -  One 7 mm polyp in the sigmoid colon, removed with                            a hot snare. Resected and retrieved.                           - The examination was otherwise normal on direct                            and retroflexion views. Recommendation:           - Patient has a contact number available for                            emergencies. The signs and symptoms of potential                            delayed complications were discussed with the                            patient. Return to normal activities tomorrow.                            Written discharge instructions were provided to the                            patient.                           - Resume previous diet.                           - Continue present medications. Please start taking                            once daily OTC fiber supplement (such as orange                             flavored citrucel powder) to help regulate your                            bowels.                           - You need to get in touch with your urologist                            about prosatitis, antibiotic recommendations ASAP.                           You will receive a letter within 2-3 weeks with the                            pathology results and my final recommendations.  If the polyp(s) is proven to be 'pre-cancerous' on                            pathology, you will need repeat colonoscopy in 5                            years. If the polyp(s) is NOT 'precancerous' on                            pathology then you should repeat colon cancer                            screening in 10 years with colonoscopy without need                            for colon cancer screening by any method prior to                            then (including stool testing). Milus Banister, MD 06/24/2017 10:00:13 AM This report has been signed electronically.

## 2017-06-24 NOTE — Patient Instructions (Signed)
YOU HAD AN ENDOSCOPIC PROCEDURE TODAY AT THE [facility]:   Refer to the procedure report that was given to you for any specific questions about what was found during the examination.  If the procedure report does not answer your questions, please call your gastroenterologist to clarify.  If you requested that your care partner not be given the details of your procedure findings, then the procedure report has been included in a sealed envelope for you to review at your convenience later.  YOU SHOULD EXPECT: Some feelings of bloating in the abdomen. Passage of more gas than usual.  Walking can help get rid of the air that was put into your GI tract during the procedure and reduce the bloating. If you had a lower endoscopy (such as a colonoscopy or flexible sigmoidoscopy) you may notice spotting of blood in your stool or on the toilet paper. If you underwent a bowel prep for your procedure, you may not have a normal bowel movement for a few days.  Please Note:  You might notice some irritation and congestion in your nose or some drainage.  This is from the oxygen used during your procedure.  There is no need for concern and it should clear up in a day or so.  SYMPTOMS TO REPORT IMMEDIATELY:   Following lower endoscopy (colonoscopy or flexible sigmoidoscopy):  Excessive amounts of blood in the stool  Significant tenderness or worsening of abdominal pains  Swelling of the abdomen that is new, acute  Fever of 100F or higher    For urgent or emergent issues, a gastroenterologist can be reached at any hour by calling [phone].   DIET:  We do recommend a small meal at first, but then you may proceed to your regular diet.  Drink plenty of fluids but you should avoid alcoholic beverages for 24 hours.  ACTIVITY:  You should plan to take it easy for the rest of today and you should NOT DRIVE or use heavy machinery until tomorrow (because of the sedation medicines used during the test).     FOLLOW UP: Our staff will call the number listed on your records the next business day following your procedure to check on you and address any questions or concerns that you may have regarding the information given to you following your procedure. If we do not reach you, we will leave a message.  However, if you are feeling well and you are not experiencing any problems, there is no need to return our call.  We will assume that you have returned to your regular daily activities without incident.  If any biopsies were taken you will be contacted by phone or by letter within the next 1-3 weeks.  Please call us at [phone] if you have not heard about the biopsies in 3 weeks.    SIGNATURES/CONFIDENTIALITY: You and/or your care partner have signed paperwork which will be entered into your electronic medical record.  These signatures attest to the fact that that the information above on your After Visit Summary has been reviewed and is understood.  Full responsibility of the confidentiality of this discharge information lies with you and/or your care-partner.   Resume medications. Information given on polyps. 

## 2017-06-24 NOTE — Progress Notes (Signed)
Report given to PACU, vss 

## 2017-06-24 NOTE — Progress Notes (Signed)
Called to room to assist during endoscopic procedure.  Patient ID and intended procedure confirmed with present staff. Received instructions for my participation in the procedure from the performing physician.  

## 2017-06-24 NOTE — Progress Notes (Signed)
Pt. Reports no change in his medical or surgical history since his pre-visit 06/02/2017.

## 2017-06-29 ENCOUNTER — Telehealth: Payer: Self-pay | Admitting: *Deleted

## 2017-06-29 NOTE — Telephone Encounter (Signed)
No answer, message left for patient. 

## 2017-06-29 NOTE — Telephone Encounter (Signed)
No answer, message left for the patient. 

## 2017-07-03 ENCOUNTER — Encounter: Payer: Self-pay | Admitting: Gastroenterology

## 2017-09-28 ENCOUNTER — Other Ambulatory Visit: Payer: Self-pay

## 2017-09-28 ENCOUNTER — Encounter (HOSPITAL_COMMUNITY): Payer: Self-pay | Admitting: Emergency Medicine

## 2017-09-28 DIAGNOSIS — R197 Diarrhea, unspecified: Secondary | ICD-10-CM | POA: Diagnosis not present

## 2017-09-28 DIAGNOSIS — R112 Nausea with vomiting, unspecified: Secondary | ICD-10-CM | POA: Insufficient documentation

## 2017-09-28 DIAGNOSIS — Z87891 Personal history of nicotine dependence: Secondary | ICD-10-CM | POA: Diagnosis not present

## 2017-09-28 LAB — COMPREHENSIVE METABOLIC PANEL
ALT: 51 U/L (ref 17–63)
ANION GAP: 10 (ref 5–15)
AST: 29 U/L (ref 15–41)
Albumin: 4.8 g/dL (ref 3.5–5.0)
Alkaline Phosphatase: 61 U/L (ref 38–126)
BUN: 11 mg/dL (ref 6–20)
CHLORIDE: 103 mmol/L (ref 101–111)
CO2: 26 mmol/L (ref 22–32)
Calcium: 9.7 mg/dL (ref 8.9–10.3)
Creatinine, Ser: 0.97 mg/dL (ref 0.61–1.24)
GFR calc non Af Amer: >60 mL/min (ref >60)
Glucose, Bld: 101 mg/dL — ABNORMAL HIGH (ref 65–99)
POTASSIUM: 3.9 mmol/L (ref 3.5–5.1)
Sodium: 139 mmol/L (ref 135–145)
Total Bilirubin: 0.5 mg/dL (ref 0.3–1.2)
Total Protein: 7.8 g/dL (ref 6.5–8.1)

## 2017-09-28 LAB — CBC
HEMATOCRIT: 45.4 % (ref 39.0–52.0)
HEMOGLOBIN: 15.9 g/dL (ref 13.0–17.0)
MCH: 33.5 pg (ref 26.0–34.0)
MCHC: 35 g/dL (ref 30.0–36.0)
MCV: 95.8 fL (ref 78.0–100.0)
Platelets: 226 10*3/uL (ref 150–400)
RBC: 4.74 MIL/uL (ref 4.22–5.81)
RDW: 12.8 % (ref 11.5–15.5)
WBC: 12.7 10*3/uL — ABNORMAL HIGH (ref 4.0–10.5)

## 2017-09-28 LAB — LIPASE, BLOOD: LIPASE: 23 U/L (ref 11–51)

## 2017-09-28 MED ORDER — ONDANSETRON 4 MG PO TBDP
4.0000 mg | ORAL_TABLET | Freq: Once | ORAL | Status: AC | PRN
  Administered 2017-09-28: 4 mg via ORAL
  Filled 2017-09-28: qty 1

## 2017-09-28 NOTE — ED Notes (Signed)
Patient unable to give urine sample at this time

## 2017-09-28 NOTE — ED Triage Notes (Signed)
Pt reports having diarrhea and vomiting along with abdominal pain that started today. Pt reports diarrhea around 1200 and vomiting there after.

## 2017-09-29 ENCOUNTER — Emergency Department (HOSPITAL_COMMUNITY)
Admission: EM | Admit: 2017-09-29 | Discharge: 2017-09-29 | Disposition: A | Discharge status: Home / Self Care | Payer: BLUE CROSS/BLUE SHIELD | Attending: Emergency Medicine | Admitting: Emergency Medicine

## 2017-09-29 DIAGNOSIS — R197 Diarrhea, unspecified: Secondary | ICD-10-CM

## 2017-09-29 DIAGNOSIS — R112 Nausea with vomiting, unspecified: Secondary | ICD-10-CM

## 2017-09-29 MED ORDER — LOPERAMIDE HCL 2 MG PO CAPS
2.0000 mg | ORAL_CAPSULE | Freq: Once | ORAL | Status: AC
  Administered 2017-09-29: 2 mg via ORAL
  Filled 2017-09-29: qty 1

## 2017-09-29 MED ORDER — DICYCLOMINE HCL 10 MG PO CAPS
20.0000 mg | ORAL_CAPSULE | Freq: Once | ORAL | Status: AC
  Administered 2017-09-29: 20 mg via ORAL
  Filled 2017-09-29: qty 2

## 2017-09-29 MED ORDER — ONDANSETRON 4 MG PO TBDP: 4.0000 mg | ORAL_TABLET | Freq: Four times a day (QID) | ORAL | 0 refills | Status: DC | PRN

## 2017-09-29 MED ORDER — LOPERAMIDE HCL 2 MG PO CAPS: 2.0000 mg | ORAL_CAPSULE | Freq: Four times a day (QID) | ORAL | 0 refills | Status: DC | PRN

## 2017-09-29 MED ORDER — DICYCLOMINE HCL 20 MG PO TABS: 20.0000 mg | ORAL_TABLET | Freq: Three times a day (TID) | ORAL | 0 refills | Status: DC | PRN

## 2017-09-29 NOTE — ED Provider Notes (Signed)
TIME SEEN: 3:18 AM  CHIEF COMPLAINT: Nausea, vomiting and diarrhea  HPI: Patient is a 32 year old male with history of asthma who presents to the emergency department with nausea, vomiting and diarrhea that started yesterday.  Has had 4 loose stools without blood.  Vomited several times at work.  No fever but did have cold sweats.  Having cramping abdominal pain.  No previous abdominal surgery.  No sick contacts or recent travel.  Denies dysuria and hematuria.  ROS: See HPI Constitutional: no fever  Eyes: no drainage  ENT: no runny nose   Cardiovascular:  no chest pain  Resp: no SOB  GI: Diarrhea and vomiting GU: no dysuria Integumentary: no rash  Allergy: no hives  Musculoskeletal: no leg swelling  Neurological: no slurred speech ROS otherwise negative  PAST MEDICAL HISTORY/PAST SURGICAL HISTORY:  Past Medical History:  Diagnosis Date  . Allergy   . Asthma   . H. pylori infection   . Puncture wound of right foot 02/28/2016    MEDICATIONS:  Prior to Admission medications   Medication Sig Start Date End Date Taking? Authorizing Provider  pantoprazole (PROTONIX) 40 MG tablet Take 1 tablet (40 mg total) by mouth daily. Patient not taking: Reported on 06/02/2017 05/29/17   [name], MD    ALLERGIES:  Allergies  Allergen Reactions  . Dust Mite Extract Itching    Sneezing & Itchiness In Eyes + pollen and grass. .  . Other Itching and Other (See Comments)    Patient prefers to take liquid instead of tablets or capsule. Also pt states he is allergic to grass which causes itching & sneezing.  . Pork-Derived Products Nausea Only    SOCIAL HISTORY:  Social History   Tobacco Use  . Smoking status: Former Smoker    Start date: 03/17/2013  . Smokeless tobacco: Never Used  Substance Use Topics  . Alcohol use: No    FAMILY HISTORY: Family History  Problem Relation Age of Onset  . Kidney disease Father   . Hypertension Paternal Grandmother   . Heart disease  Paternal Uncle   . Diabetes Maternal Aunt   . Colon cancer Neg Hx   . Stomach cancer Neg Hx     EXAM: BP (!) 143/87 (BP Location: Left Arm)   Pulse 66   Temp 98.7 F (37.1 C) (Oral)   Resp 18   Ht 5\' 9"  (1.753 m)   Wt 93 kg (205 lb)   SpO2 97%   BMI 30.27 kg/m  CONSTITUTIONAL: Alert and oriented and responds appropriately to questions. Well-appearing; well-nourished HEAD: Normocephalic EYES: Conjunctivae clear, pupils appear equal, EOMI ENT: normal nose; moist mucous membranes NECK: Supple, no meningismus, no nuchal rigidity, no LAD  CARD: RRR; S1 and S2 appreciated; no murmurs, no clicks, no rubs, no gallops RESP: Normal chest excursion without splinting or tachypnea; breath sounds clear and equal bilaterally; no wheezes, no rhonchi, no rales, no hypoxia or respiratory distress, speaking full sentences ABD/GI: Normal bowel sounds; non-distended; soft, non-tender, no rebound, no guarding, no peritoneal signs, no hepatosplenomegaly BACK:  The back appears normal and is non-tender to palpation, there is no CVA tenderness EXT: Normal ROM in all joints; non-tender to palpation; no edema; normal capillary refill; no cyanosis, no calf tenderness or swelling    SKIN: Normal color for age and race; warm; no rash NEURO: Moves all extremities equally PSYCH: The patient's mood and manner are appropriate. Grooming and personal hygiene are appropriate.  MEDICAL DECISION MAKING: Patient here with vomiting  and diarrhea.  Suspect viral gastroenteritis.  Abdominal exam is benign.  Negative Murphy sign and no tenderness at McBurney's point.  I feel he is safe to be discharged.  Discussed supportive care instructions including increase fluid intake and bland diet.  Will treat symptomatically with Zofran, Bentyl, Imodium.  Will fluid challenge here prior to discharge.  I do not think the patient has colitis, diverticulitis, bowel obstruction, appendicitis, cholecystitis, pancreatitis.  I do not feel he  needs emergent imaging.   At this time, I do not feel there is any life-threatening condition present. I have reviewed and discussed all results (EKG, imaging, lab, urine as appropriate) and exam findings with patient/family. I have reviewed nursing notes and appropriate previous records.  I feel the patient is safe to be discharged home without further emergent workup and can continue workup as an outpatient as needed. Discussed usual and customary return precautions. Patient/family verbalize understanding and are comfortable with this plan.  Outpatient follow-up has been provided if needed. All questions have been answered.      Shaine Newmark, Delice Bison, DO 09/29/17 [phone]

## 2018-01-18 ENCOUNTER — Encounter (HOSPITAL_COMMUNITY): Payer: Self-pay | Admitting: Family Medicine

## 2018-01-18 ENCOUNTER — Ambulatory Visit (HOSPITAL_COMMUNITY)
Admission: EM | Admit: 2018-01-18 | Discharge: 2018-01-18 | Disposition: A | Discharge status: Home / Self Care | Payer: BLUE CROSS/BLUE SHIELD | Attending: Internal Medicine | Admitting: Internal Medicine

## 2018-01-18 DIAGNOSIS — B349 Viral infection, unspecified: Secondary | ICD-10-CM

## 2018-01-18 MED ORDER — CETIRIZINE HCL 10 MG PO TABS: 10.0000 mg | ORAL_TABLET | Freq: Every day | ORAL | 0 refills | Status: DC

## 2018-01-18 MED ORDER — IPRATROPIUM BROMIDE 0.06 % NA SOLN
2.0000 | Freq: Four times a day (QID) | NASAL | 0 refills | Status: DC
Start: 2018-01-18 — End: 2018-09-21

## 2018-01-18 MED ORDER — FLUTICASONE PROPIONATE 50 MCG/ACT NA SUSP: 2.0000 | Freq: Every day | NASAL | 0 refills | Status: DC

## 2018-01-18 NOTE — ED Triage Notes (Addendum)
Pt here for cough and thick mucous. sts hx of bronchitis. He has been having chills, loss of appetite and weakness. sts sore throat at night and upon waking with dryness.

## 2018-01-18 NOTE — Discharge Instructions (Signed)
Start flonase, atrovent nasal spray, zyrtec for nasal congestion/drainage. You can use over the counter nasal saline rinse such as neti pot for nasal congestion. Keep hydrated, your urine should be clear to pale yellow in color. Tylenol/motrin for fever and pain. Monitor for any worsening of symptoms, chest pain, shortness of breath, wheezing, swelling of the throat, follow up for reevaluation.   For sore throat/cough try using a honey-based tea. Use 3 teaspoons of honey with juice squeezed from half lemon. Place shaved pieces of ginger into 1/2-1 cup of water and warm over stove top. Then mix the ingredients and repeat every 4 hours as needed.

## 2018-01-18 NOTE — ED Provider Notes (Signed)
[place]    CSN: [id number] Arrival date & time: 01/18/18  1417     History   Chief Complaint Chief Complaint  Patient presents with  . Cough    HPI Michael Rogers is a 32 y.o. male.   32 year old male comes in for 2 day history of URI symptoms. Has had some thick productive cough intermittently. States woke up with sore throat. Has had some nasal congestion/rhinorrhea. Denies sneezing. Denies fever. Does have some chills. States has had decreased appetite. Has not taken anything for his symptoms. Former smoker, 10-15 year history, 0.5ppd-2ppd depending on whether or not he is drinking.      Past Medical History:  Diagnosis Date  . Allergy   . Asthma   . H. pylori infection   . Puncture wound of right foot 02/28/2016    Patient Active Problem List   Diagnosis Date Noted  . Abdominal pain 05/29/2017  . Prostatitis 05/29/2017  . Tobacco abuse 02/20/2012  . Asthma, intermittent 01/21/2012  . H. pylori infection 01/21/2012  . Seasonal allergies 01/21/2012    Past Surgical History:  Procedure Laterality Date  . ROOT CANAL         Home Medications    Prior to Admission medications   Medication Sig Start Date End Date Taking? Authorizing Provider  cetirizine (ZYRTEC) 10 MG tablet Take 1 tablet (10 mg total) by mouth daily. 01/18/18   Tasia Catchings, Kemiya Batdorf V, PA-C  fluticasone (FLONASE) 50 MCG/ACT nasal spray Place 2 sprays into both nostrils daily. 01/18/18   Tasia Catchings, Lastacia Solum V, PA-C  ipratropium (ATROVENT) 0.06 % nasal spray Place 2 sprays into both nostrils 4 (four) times daily. 01/18/18   Tasia Catchings, [place]rata Verville V, PA-C  pantoprazole (PROTONIX) 40 MG tablet Take 1 tablet (40 mg total) by mouth daily. Patient not taking: Reported on 06/02/2017 05/29/17   [name], MD    Family History Family History  Problem Relation Age of Onset  . Kidney disease Father   . Hypertension Paternal Grandmother   . Heart disease Paternal Uncle   . Diabetes Maternal Aunt   . Colon cancer Neg  Hx   . Stomach cancer Neg Hx     Social History Social History   Tobacco Use  . Smoking status: Former Smoker    Start date: 03/17/2013  . Smokeless tobacco: Never Used  Substance Use Topics  . Alcohol use: No  . Drug use: Yes    Types: Marijuana    Comment: daily     Allergies   Dust mite extract; Other; and Pork-derived products   Review of Systems Review of Systems  Reason unable to perform ROS: See HPI as above.     Physical Exam Triage Vital Signs ED Triage Vitals  Enc Vitals Group     BP 01/18/18 1451 (!) 148/92     Pulse Rate 01/18/18 1451 71     Resp 01/18/18 1451 18     Temp 01/18/18 1451 98.3 F (36.8 C)     Temp src --      SpO2 01/18/18 1451 100 %     Weight --      Height --      Head Circumference --      Peak Flow --      Pain Score 01/18/18 1449 0     Pain Loc --      Pain Edu? --      Excl. in [place]? --    No data  found.  Updated Vital Signs BP (!) 148/92   Pulse 71   Temp 98.3 F (36.8 C)   Resp 18   SpO2 100%   Physical Exam  Constitutional: He is oriented to person, place, and time. He appears well-developed and well-nourished. No distress.  HENT:  Head: Normocephalic and atraumatic.  Right Ear: Tympanic membrane, external ear and ear canal normal. Tympanic membrane is not erythematous and not bulging.  Left Ear: Tympanic membrane, external ear and ear canal normal. Tympanic membrane is not erythematous and not bulging.  Nose: Rhinorrhea present. Right sinus exhibits no maxillary sinus tenderness and no frontal sinus tenderness. Left sinus exhibits no maxillary sinus tenderness and no frontal sinus tenderness.  Mouth/Throat: Uvula is midline, oropharynx is clear and moist and mucous membranes are normal.  Eyes: Pupils are equal, round, and reactive to light. Conjunctivae are normal.  Neck: Normal range of motion. Neck supple.  Cardiovascular: Normal rate, regular rhythm and normal heart sounds. Exam reveals no gallop and no friction  rub.  No murmur heard. Pulmonary/Chest: Effort normal and breath sounds normal. No accessory muscle usage or stridor. No respiratory distress. He has no decreased breath sounds. He has no wheezes. He has no rhonchi. He has no rales.  Lymphadenopathy:    He has no cervical adenopathy.  Neurological: He is alert and oriented to person, place, and time.  Skin: Skin is warm and dry.  Psychiatric: He has a normal mood and affect. His behavior is normal. Judgment normal.    UC Treatments / Results  Labs (all labs ordered are listed, but only abnormal results are displayed) Labs Reviewed - No data to display  EKG None  Radiology No results found.  Procedures Procedures (including critical care time)  Medications Ordered in UC Medications - No data to display  Initial Impression / Assessment and Plan / UC Course  I have reviewed the triage vital signs and the nursing notes.  Pertinent labs & imaging results that were available during my care of the patient were reviewed by me and considered in my medical decision making (see chart for details).    Discussed with patient history and exam most consistent with viral URI. Symptomatic treatment as needed. Push fluids. Return precautions given.   Final Clinical Impressions(s) / UC Diagnoses   Final diagnoses:  Viral illness    ED Prescriptions    Medication Sig Dispense Auth. Provider   fluticasone (FLONASE) 50 MCG/ACT nasal spray Place 2 sprays into both nostrils daily. 1 g Millisa Giarrusso V, PA-C   ipratropium (ATROVENT) 0.06 % nasal spray Place 2 sprays into both nostrils 4 (four) times daily. 15 mL Anai Lipson V, PA-C   cetirizine (ZYRTEC) 10 MG tablet Take 1 tablet (10 mg total) by mouth daily. 15 tablet Tobin Chad, [place] 01/18/18 1547

## 2018-01-21 ENCOUNTER — Encounter (HOSPITAL_COMMUNITY): Payer: Self-pay | Admitting: Emergency Medicine

## 2018-01-21 ENCOUNTER — Ambulatory Visit (HOSPITAL_COMMUNITY)
Admission: EM | Admit: 2018-01-21 | Discharge: 2018-01-21 | Disposition: A | Discharge status: Home / Self Care | Payer: BLUE CROSS/BLUE SHIELD

## 2018-01-21 DIAGNOSIS — J069 Acute upper respiratory infection, unspecified: Secondary | ICD-10-CM | POA: Diagnosis not present

## 2018-01-21 NOTE — Discharge Instructions (Signed)
Push fluids to ensure adequate hydration and keep secretions thin.  Tylenol and/or ibuprofen as needed for pain or fevers.  Please continue to take zyrtec daily.  Stop taking the nasal spray 4 times a day, use only right before bed.  If symptoms worsen or do not improve in the next week to return to be seen or to follow up with your PCP or occupational health.

## 2018-01-21 NOTE — ED Triage Notes (Signed)
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

## 2018-01-21 NOTE — ED Provider Notes (Signed)
[place]    CSN: [id number] Arrival date & time: 01/21/18  1517     History   Chief Complaint Chief Complaint  Patient presents with  . Cough    HPI Michael Rogers is a 32 y.o. male.   Juliano presents with requests for work note extension as he experiences some dizziness and fatigue after using atrovent nasal spray. Was seen here 6/17 for URI, started on zyrtec, flonase and atrovent. 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 States his symptoms have significantly improved. Only with congestion primarily in the morning. Somewhat of a scratchy throat. No nausea, vomiting or diarrhea. No ear pain and no longer with fevers. Feels that the nasal spray has been helpful but states after he uses it he feels slightly light headed briefly. No cough. Does not smoke. Hx of allergies, asthma   ROS per HPI.      Past Medical History:  Diagnosis Date  . Allergy   . Asthma   . H. pylori infection   . Puncture wound of right foot 02/28/2016    Patient Active Problem List   Diagnosis Date Noted  . Abdominal pain 05/29/2017  . Prostatitis 05/29/2017  . Tobacco abuse 02/20/2012  . Asthma, intermittent 01/21/2012  . H. pylori infection 01/21/2012  . Seasonal allergies 01/21/2012    Past Surgical History:  Procedure Laterality Date  . ROOT CANAL         Home Medications    Prior to Admission medications   Medication Sig Start Date End Date Taking? Authorizing Provider  cetirizine (ZYRTEC) 10 MG tablet Take 1 tablet (10 mg total) by mouth daily. 01/18/18   Tasia Catchings, Amy V, PA-C  fluticasone (FLONASE) 50 MCG/ACT nasal spray Place 2 sprays into both nostrils daily. 01/18/18   Tasia Catchings, Amy V, PA-C  ipratropium (ATROVENT) 0.06 % nasal spray Place 2 sprays into both nostrils 4 (four) times daily. 01/18/18   Tasia Catchings, Amy V, PA-C  pantoprazole (PROTONIX) 40 MG tablet Take 1 tablet (40 mg total) by mouth daily. Patient not  taking: Reported on 06/02/2017 05/29/17   [name], MD    Family History Family History  Problem Relation Age of Onset  . Kidney disease Father   . Hypertension Paternal Grandmother   . Heart disease Paternal Uncle   . Diabetes Maternal Aunt   . Colon cancer Neg Hx   . Stomach cancer Neg Hx     Social History Social History   Tobacco Use  . Smoking status: Former Smoker    Start date: 03/17/2013  . Smokeless tobacco: Never Used  Substance Use Topics  . Alcohol use: No  . Drug use: Yes    Types: Marijuana    Comment: daily     Allergies   Dust mite extract; Other; and Pork-derived products   Review of Systems Review of Systems   Physical Exam Triage Vital Signs ED Triage Vitals [01/21/18 1532]  Enc Vitals Group     BP (!) 142/84     Pulse Rate 72     Resp 18     Temp 98.7 F (37.1 C)     Temp src      SpO2 97 %     Weight      Height      Head Circumference      Peak Flow      Pain Score 0  Pain Loc      Pain Edu?      Excl. in [place]?    No data found.  Updated Vital Signs BP (!) 142/84   Pulse 72   Temp 98.7 F (37.1 C)   Resp 18   SpO2 97%    Physical Exam  Constitutional: He is oriented to person, place, and time. He appears well-developed and well-nourished.  HENT:  Head: Normocephalic and atraumatic.  Right Ear: Tympanic membrane, external ear and ear canal normal.  Left Ear: Tympanic membrane, external ear and ear canal normal.  Nose: Nose normal. Right sinus exhibits no maxillary sinus tenderness and no frontal sinus tenderness. Left sinus exhibits no maxillary sinus tenderness and no frontal sinus tenderness.  Mouth/Throat: Uvula is midline, oropharynx is clear and moist and mucous membranes are normal.  Eyes: Pupils are equal, round, and reactive to light. Conjunctivae are normal.  Neck: Normal range of motion.  Cardiovascular: Normal rate and regular rhythm.  Pulmonary/Chest: Effort normal and breath sounds normal.    Musculoskeletal: Normal range of motion.  Lymphadenopathy:    He has no cervical adenopathy.  Neurological: He is alert and oriented to person, place, and time. No cranial nerve deficit or sensory deficit. He exhibits normal muscle tone. Coordination normal.  Skin: Skin is warm and dry.  Vitals reviewed.    UC Treatments / Results  Labs (all labs ordered are listed, but only abnormal results are displayed) Labs Reviewed - No data to display  EKG None  Radiology No results found.  Procedures Procedures (including critical care time)  Medications Ordered in UC Medications - No data to display  Initial Impression / Assessment and Plan / UC Course  I have reviewed the triage vital signs and the nursing notes.  Pertinent labs & imaging results that were available during my care of the patient were reviewed by me and considered in my medical decision making (see chart for details).     Non toxic in appearance. Without acute findings on exam. Without any neurological findings. Symptoms are improving. Discussed with patient that he may stop using nasal spray and use only oral zyrtec to decrease symptoms, or could use nasal spray just before sleep as congestion mainly with wakening. Patient states he would like to use it one more day since it has been so helpful with his symptoms. Work note provided. Return precautions provided. Follow up with occupational health if further restrictions needed. Patient verbalized understanding and agreeable to plan.  Ambulatory out of clinic without difficulty.    Final Clinical Impressions(s) / UC Diagnoses   Final diagnoses:  Upper respiratory tract infection, unspecified type     Discharge Instructions     Push fluids to ensure adequate hydration and keep secretions thin.  Tylenol and/or ibuprofen as needed for pain or fevers.  Please continue to take zyrtec daily.  Stop taking the nasal spray 4 times a day, use only right before bed.  If  symptoms worsen or do not improve in the next week to return to be seen or to follow up with your PCP or occupational health.     ED Prescriptions    None     Controlled Substance Prescriptions [place] Controlled Substance Registry consulted? Not Applicable   Zigmund Gottron, NP 01/21/18 [phone]

## 2018-09-21 ENCOUNTER — Other Ambulatory Visit: Payer: Self-pay

## 2018-09-21 ENCOUNTER — Ambulatory Visit (HOSPITAL_COMMUNITY)
Admission: RE | Admit: 2018-09-21 | Discharge: 2018-09-21 | Disposition: A | Discharge status: Home / Self Care | Payer: 59 | Source: Ambulatory Visit | Attending: Family Medicine | Admitting: Family Medicine

## 2018-09-21 ENCOUNTER — Encounter: Payer: Self-pay | Admitting: Family Medicine

## 2018-09-21 ENCOUNTER — Ambulatory Visit (INDEPENDENT_AMBULATORY_CARE_PROVIDER_SITE_OTHER): Payer: 59 | Admitting: Family Medicine

## 2018-09-21 VITALS — BP 100/60 | HR 68 | Temp 98.7°F | Ht 69.0 in | Wt 202.0 lb

## 2018-09-21 DIAGNOSIS — R001 Bradycardia, unspecified: Secondary | ICD-10-CM

## 2018-09-21 DIAGNOSIS — R51 Headache: Secondary | ICD-10-CM

## 2018-09-21 DIAGNOSIS — R079 Chest pain, unspecified: Secondary | ICD-10-CM

## 2018-09-21 DIAGNOSIS — R42 Dizziness and giddiness: Secondary | ICD-10-CM | POA: Diagnosis not present

## 2018-09-21 DIAGNOSIS — R519 Headache, unspecified: Secondary | ICD-10-CM | POA: Insufficient documentation

## 2018-09-21 DIAGNOSIS — R5383 Other fatigue: Secondary | ICD-10-CM | POA: Insufficient documentation

## 2018-09-21 DIAGNOSIS — R7309 Other abnormal glucose: Secondary | ICD-10-CM | POA: Diagnosis not present

## 2018-09-21 DIAGNOSIS — Z13228 Encounter for screening for other metabolic disorders: Secondary | ICD-10-CM | POA: Diagnosis not present

## 2018-09-21 DIAGNOSIS — J4520 Mild intermittent asthma, uncomplicated: Secondary | ICD-10-CM

## 2018-09-21 HISTORY — DX: Headache, unspecified: R51.9

## 2018-09-21 LAB — POCT GLYCOSYLATED HEMOGLOBIN (HGB A1C): Hemoglobin A1C: 5.2 % (ref 4.0–5.6)

## 2018-09-21 NOTE — Assessment & Plan Note (Signed)
Stable off med. I congratulated him on quitting tobacco Monitor closely.

## 2018-09-21 NOTE — Progress Notes (Signed)
Subjective:     Patient ID: Michael Rogers, male   DOB: [birth year], 33 y.o.   MRN: [id number]  Dizziness  This is a chronic (Started 10 years ago. Waxes and wane. He had another episode 4 days ago) problem. The current episode started in the past 7 days (Started 3 days ago). The problem occurs intermittently (Getting frequent over the last months. He got scared 4 days ago). The problem has been waxing and waning. Associated symptoms include fatigue and headaches. Pertinent negatives include no chest pain, fever, nausea or vomiting. Associated symptoms comments: Denies LOC, no fall. Years ago, he loss consciousness. He had chest pain on Sat (3 days ago). Vision feels blurry whenever it happens. Moving too fast aggravates his symptoms. Sometimes have palpitation.  He threw up one time but not since then.. Exacerbated by: Moving too fast. Treatments tried: He drinks sugery fluid or eat then he will feel better.  Asthma:He has not used his albuterol since 2010 or 2011. He has been doing well since then. Quit cold [place] 2014. DM screening: Concern about DM due to strong family hx. He will like to get tested.  No current outpatient medications on file prior to visit.   No current facility-administered medications on file prior to visit.    Past Medical History:  Diagnosis Date  . Allergy   . Asthma   . H. pylori infection   . Puncture wound of right foot 02/28/2016   Vitals:   09/21/18 1025  BP: 100/60  Pulse: 68  Temp: 98.7 F (37.1 C)  TempSrc: Oral  SpO2: 99%  Weight: 202 lb (91.6 kg)  Height: 5\' 9"  (1.753 m)     Review of Systems  Constitutional: Positive for fatigue. Negative for fever.  Respiratory: Negative.   Cardiovascular: Negative.  Negative for chest pain.  Gastrointestinal: Negative.  Negative for nausea and vomiting.  Musculoskeletal: Negative.   Neurological: Positive for dizziness and headaches.  All other systems reviewed and are negative.      Objective:   Physical Exam Vitals signs and nursing note reviewed.  Constitutional:      Appearance: Normal appearance.  HENT:     Head: Normocephalic.  Eyes:     Extraocular Movements: Extraocular movements intact.     Pupils: Pupils are equal, round, and reactive to light.  Cardiovascular:     Rate and Rhythm: Regular rhythm. Bradycardia present.     Pulses: Normal pulses.     Heart sounds: Normal heart sounds. No murmur.  Pulmonary:     Effort: Pulmonary effort is normal. No respiratory distress.     Breath sounds: Normal breath sounds. No stridor. No wheezing or rhonchi.  Abdominal:     General: Abdomen is flat. Bowel sounds are normal. There is no distension.     Palpations: Abdomen is soft. There is no mass.  Musculoskeletal: Normal range of motion.  Neurological:     General: No focal deficit present.     Mental Status: He is alert and oriented to person, place, and time.     Cranial Nerves: Cranial nerves are intact. No cranial nerve deficit.     Sensory: Sensation is intact. No sensory deficit.     Motor: No weakness or tremor.     Coordination: Coordination is intact. Coordination normal.     Gait: Gait normal.     Deep Tendon Reflexes: Reflexes normal.        Assessment:     Dizziness and fatigue; Sere bradycardia Asthma  DM screen    Plan:     Check problem list.  DM screen: A1C normal.

## 2018-09-21 NOTE — Assessment & Plan Note (Addendum)
Currently asymptomatic. Vitals stable. Concern about fatigue and dizziness may be related to this. I called and spoke with the Cardiologist (Dr. Claiborne Billings) and discussed EKG report with him. He recommended outpatient cardiac work-up as early as possible. Urgent referral made (Feb 29th) He is aware of the date and time. ED precaution discussed. All questions were answered.  ECHO pending Cardiology eval. Order placed.

## 2018-09-21 NOTE — Assessment & Plan Note (Signed)
May be related to his bradycardia. R/O endocrine,hematologic or electrolyte pathology. CBC, Cmet and TSH checked. Keep well hydrated and rest when needed. I will contact him soon with the result.

## 2018-09-21 NOTE — Assessment & Plan Note (Signed)
Similar to previous headache he said. Currently asymptomatic. No neurologic deficit. Headache diary recommended. ED precaution discussed.

## 2018-09-21 NOTE — Patient Instructions (Signed)
Please go straight to the ED if you feel dizzy again or have chest pain. We will set you up with a cardiologist.  Bradycardia, Adult Bradycardia is a slower-than-normal heartbeat. A normal resting heart rate for an adult ranges from 60 to 100 beats per minute. With bradycardia, the resting heart rate is less than 60 beats per minute. Bradycardia can prevent enough oxygen from reaching certain areas of your body when you are active. It can be serious if it keeps enough oxygen from reaching your brain and other parts of your body. Bradycardia is not a problem for everyone. For some healthy adults, a slow resting heart rate is normal. What are the causes? This condition may be caused by:  A problem with the heart, including: ? A problem with the heart's electrical system, such as a heart block. With a heart block, electrical signals between the chambers of the heart are partially or completely blocked, so they are not able to work as they should. ? A problem with the heart's natural pacemaker (sinus node). ? Heart disease. ? A heart attack. ? Heart damage. ? Lyme disease. ? A heart infection. ? A heart condition that is present at birth (congenital heart defect).  Certain medicines that treat heart conditions.  Certain conditions, such as hypothyroidism and obstructive sleep apnea.  Problems with the balance of chemicals and other substances, like potassium, in the blood.  Trauma.  Radiation therapy. What increases the risk? You are more likely to develop this condition if you:  Are age 58 or older.  Have high blood pressure (hypertension), high cholesterol (hyperlipidemia), or diabetes.  Drink heavily, use tobacco or nicotine products, or use drugs. What are the signs or symptoms? Symptoms of this condition include:  Light-headedness.  Feeling faint or fainting.  Fatigue and weakness.  Trouble with activity or exercise.  Shortness of breath.  Chest pain  (angina).  Drowsiness.  Confusion.  Dizziness. How is this diagnosed? This condition may be diagnosed based on:  Your symptoms.  Your medical history.  A physical exam. During the exam, your health care provider will listen to your heartbeat and check your pulse. To confirm the diagnosis, your health care provider may order tests, such as:  Blood tests.  An electrocardiogram (ECG). This test records the heart's electrical activity. The test can show how fast your heart is beating and whether the heartbeat is steady.  A test in which you wear a portable device (event recorder or Holter monitor) to record your heart's electrical activity while you go about your day.  Anexercise test. How is this treated? Treatment for this condition depends on the cause of the condition and how severe your symptoms are. Treatment may involve:  Treatment of the underlying condition.  Changing your medicines or how much medicine you take.  Having a small, battery-operated device called a pacemaker implanted under the skin. When bradycardia occurs, this device can be used to increase your heart rate and help your heart beat in a regular rhythm. Follow these instructions at home: Lifestyle   Manage any health conditions that contribute to bradycardia as told by your health care provider.  Follow a heart-healthy diet. A nutrition specialist (dietitian) can help educate you about healthy food options and changes.  Follow an exercise program that is approved by your health care provider.  Maintain a healthy weight.  Try to reduce or manage your stress, such as with yoga or meditation. If you need help reducing stress, ask your health  care provider.  Do not use any products that contain nicotine or tobacco, such as cigarettes, e-cigarettes, and chewing tobacco. If you need help quitting, ask your health care provider.  Do not use illegal drugs.  Limit alcohol intake to no more than 1 drink a  day for nonpregnant women and 2 drinks a day for men. Be aware of how much alcohol is in your drink. In the [place] one drink equals one 12 oz bottle of beer (355 mL), one 5 oz glass of wine (148 mL), or one 1 oz glass of hard liquor (44 mL). General instructions  Take over-the-counter and prescription medicines only as told by your health care provider.  Keep all follow-up visits as told by your health care provider. This is important. How is this prevented? In some cases, bradycardia may be prevented by:  Treating underlying medical problems.  Stopping behaviors or medicines that can trigger the condition. Contact a health care provider if you:  Feel light-headed or dizzy.  Almost faint.  Feel weak or are easily fatigued during physical activity.  Experience confusion or have memory problems. Get help right away if:  You faint.  You have: ? An irregular heartbeat (palpitations). ? Chest pain. ? Trouble breathing. Summary  Bradycardia is a slower-than-normal heartbeat. With bradycardia, the resting heart rate is less than 60 beats per minute.  Treatment for this condition depends on the cause.  Manage any health conditions that contribute to bradycardia as told by your health care provider.  Do not use any products that contain nicotine or tobacco, such as cigarettes, e-cigarettes, and chewing tobacco, and limit alcohol intake.  Keep all follow-up visits as told by your health care provider. This is important. This information is not intended to replace advice given to you by your health care provider. Make sure you discuss any questions you have with your health care provider. Document Released: 04/12/2002 Document Revised: 12/30/2017 Document Reviewed: 12/30/2017 Elsevier Interactive Patient Education  2019 [employer].

## 2018-09-22 LAB — CBC WITH DIFFERENTIAL/PLATELET
BASOS ABS: 0.1 10*3/uL (ref 0.0–0.2)
Basos: 1 %
EOS (ABSOLUTE): 0.1 10*3/uL (ref 0.0–0.4)
Eos: 2 %
Hematocrit: 45.2 % (ref 37.5–51.0)
Hemoglobin: 15.4 g/dL (ref 13.0–17.7)
IMMATURE GRANS (ABS): 0 10*3/uL (ref 0.0–0.1)
IMMATURE GRANULOCYTES: 0 %
LYMPHS: 36 %
Lymphocytes Absolute: 2.4 10*3/uL (ref 0.7–3.1)
MCH: 33 pg (ref 26.6–33.0)
MCHC: 34.1 g/dL (ref 31.5–35.7)
MCV: 97 fL (ref 79–97)
Monocytes Absolute: 0.4 10*3/uL (ref 0.1–0.9)
Monocytes: 7 %
Neutrophils Absolute: 3.6 10*3/uL (ref 1.4–7.0)
Neutrophils: 54 %
PLATELETS: 249 10*3/uL (ref 150–450)
RBC: 4.66 x10E6/uL (ref 4.14–5.80)
RDW: 12.6 % (ref 11.6–15.4)
WBC: 6.7 10*3/uL (ref 3.4–10.8)

## 2018-09-22 LAB — TSH: TSH: 1.97 u[IU]/mL (ref 0.450–4.500)

## 2018-09-22 LAB — CMP14+EGFR
A/G RATIO: 2.1 (ref 1.2–2.2)
ALT: 42 IU/L (ref 0–44)
AST: 30 IU/L (ref 0–40)
Albumin: 5 g/dL (ref 4.0–5.0)
Alkaline Phosphatase: 79 IU/L (ref 39–117)
BILIRUBIN TOTAL: 0.4 mg/dL (ref 0.0–1.2)
BUN/Creatinine Ratio: 11 (ref 9–20)
BUN: 10 mg/dL (ref 6–20)
CALCIUM: 9.8 mg/dL (ref 8.7–10.2)
CHLORIDE: 99 mmol/L (ref 96–106)
CO2: 24 mmol/L (ref 20–29)
Creatinine, Ser: 0.93 mg/dL (ref 0.76–1.27)
GFR calc Af Amer: 125 mL/min/{1.73_m2} (ref >59)
GFR, EST NON AFRICAN AMERICAN: 108 mL/min/{1.73_m2} (ref >59)
Globulin, Total: 2.4 g/dL (ref 1.5–4.5)
Glucose: 84 mg/dL (ref 65–99)
POTASSIUM: 4.4 mmol/L (ref 3.5–5.2)
Sodium: 141 mmol/L (ref 134–144)
Total Protein: 7.4 g/dL (ref 6.0–8.5)

## 2018-09-23 NOTE — Addendum Note (Signed)
Addended by: Andrena Mews T on: 09/23/2018 09:03 PM   Modules accepted: Orders

## 2018-09-27 ENCOUNTER — Other Ambulatory Visit (HOSPITAL_COMMUNITY): Payer: 59

## 2018-09-27 NOTE — Progress Notes (Signed)
It would be nice for him to get it before. However, depending on scheduling, we can wait till after Cards visit. Thanks.

## 2018-09-28 ENCOUNTER — Telehealth: Payer: Self-pay | Admitting: Family Medicine

## 2018-09-28 ENCOUNTER — Encounter: Payer: Self-pay | Admitting: Cardiology

## 2018-09-28 ENCOUNTER — Ambulatory Visit: Payer: 59 | Admitting: Cardiology

## 2018-09-28 VITALS — BP 129/78 | HR 59 | Ht 69.0 in | Wt 206.0 lb

## 2018-09-28 DIAGNOSIS — R001 Bradycardia, unspecified: Secondary | ICD-10-CM

## 2018-09-28 DIAGNOSIS — R42 Dizziness and giddiness: Secondary | ICD-10-CM | POA: Diagnosis not present

## 2018-09-28 DIAGNOSIS — Z87898 Personal history of other specified conditions: Secondary | ICD-10-CM

## 2018-09-28 DIAGNOSIS — F1991 Other psychoactive substance use, unspecified, in remission: Secondary | ICD-10-CM

## 2018-09-28 DIAGNOSIS — R0609 Other forms of dyspnea: Secondary | ICD-10-CM

## 2018-09-28 NOTE — Telephone Encounter (Signed)
Pt is calling and would like to speak with Jazmin. He said he spoke with her yesterday and he has some questions concerning the scans they discussed.   The best call back number is [phone].

## 2018-09-28 NOTE — Progress Notes (Signed)
Subjective:   @Patient  ID@: Michael Rogers, male    DOB: 09/24/1985, 33 y.o.   MRN: [id number]  Patient referred by Andrena Mews T for dizziness and bradycardia.  Chief Complaint  Patient presents with  . Bradycardia    dizzoness/ low bp     Patient reports that dizziness, fatigue, SOB started several years ago and states that he has always attributed to asthma. Reports on 09/18/2018 had severe episode of lightheadedness with associated shortness of breath that improved with drinking coffee. He later had another episode the same day while at [facility], where he had to sit down to rest in his car and ate something. He was evaluated by his PCP and had an episode while in their office, in which EKG was performed during that time and revealed bradycardia. He has now been paying attention to his symptoms and feels that he has mild symptoms daily. He reports one episode a few years ago where he was leaning over and felt whoozy and briefly lost consciousness, but has not had any recent episodes of syncope. He denies any chest pain.   He does not use inhaler as he feels makes asthma worse. Denies any hypertension, hyperlipidemia, or thyroid problems.   Denies any family history of sudden cardiac death. Denies any alcohol use. Former smoker quit 2013 that smoked for approximatley 10 years. He did former use coccaine for 3 years that quit early 2000's. He also briefly used ectasy.   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    Past Medical History:  Diagnosis Date  . Allergy   . Asthma   . Chest pain   . H. pylori infection   . Prostatitis 05/29/2017  . Puncture wound of right foot 02/28/2016    Past Surgical History:  Procedure Laterality Date  . COLONOSCOPY  2018  . MOUTH SURGERY    . ROOT CANAL      Family History  Problem Relation Age of Onset  . Kidney disease Father   . Hypertension Paternal Grandmother   . Heart  disease Paternal Uncle   . Diabetes Maternal Aunt   . Colon cancer Neg Hx   . Stomach cancer Neg Hx     Social History   Socioeconomic History  . Marital status: Single    Spouse name: Not on file  . Number of children: 1  . Years of education: Not on file  . Highest education level: Not on file  Occupational History  . Occupation: [employer]  Social Needs  . Financial resource strain: Not on file  . Food insecurity:    Worry: Not on file    Inability: Not on file  . Transportation needs:    Medical: Not on file    Non-medical: Not on file  Tobacco Use  . Smoking status: Former Smoker    Packs/day: 0.50    Years: 10.00    Pack years: 5.00    Types: Cigarettes    Start date: 03/17/2013    Last attempt to quit: 09/28/2013    Years since quitting: 5.0  . Smokeless tobacco: Never Used  Substance and Sexual Activity  . Alcohol use: No  . Drug use: Yes    Types: Marijuana    Comment: daily  . Sexual activity: Yes    Partners: Female    Birth control/protection: None  Lifestyle  . Physical activity:    Days per week: Not  on file    Minutes per session: Not on file  . Stress: Not on file  Relationships  . Social connections:    Talks on phone: Not on file    Gets together: Not on file    Attends religious service: Not on file    Active member of club or organization: Not on file    Attends meetings of clubs or organizations: Not on file    Relationship status: Not on file  . Intimate partner violence:    Fear of current or ex partner: Not on file    Emotionally abused: Not on file    Physically abused: Not on file    Forced sexual activity: Not on file  Other Topics Concern  . Not on file  Social History Narrative   Lives with mother.     History of incarceration in 2012.    No current outpatient medications on file prior to visit.   No current facility-administered medications on file prior to visit.      Review of Systems  Constitution: Positive for  malaise/fatigue. Negative for decreased appetite, weight gain and weight loss.  Eyes: Negative for visual disturbance.  Cardiovascular: Positive for dyspnea on exertion. Negative for chest pain, claudication, leg swelling, orthopnea, palpitations and syncope.  Respiratory: Negative for hemoptysis and wheezing.   Endocrine: Negative for cold intolerance and heat intolerance.  Hematologic/Lymphatic: Does not bruise/bleed easily.  Skin: Negative for nail changes.  Musculoskeletal: Negative for muscle weakness and myalgias.  Gastrointestinal: Negative for abdominal pain, change in bowel habit, nausea and vomiting.  Neurological: Positive for light-headedness. Negative for difficulty with concentration, dizziness, focal weakness and headaches.  Psychiatric/Behavioral: Negative for altered mental status and suicidal ideas.  All other systems reviewed and are negative.      Objective:   Physical Exam  Constitutional: He is oriented to person, place, and time. Vital signs are normal. He appears well-developed and well-nourished.  HENT:  Head: Normocephalic and atraumatic.  Neck: Normal range of motion.  Cardiovascular: Normal rate, regular rhythm, normal heart sounds and intact distal pulses.  Pulmonary/Chest: Effort normal and breath sounds normal. No accessory muscle usage. No respiratory distress.  Abdominal: Soft. Bowel sounds are normal.  Musculoskeletal: Normal range of motion.  Neurological: He is alert and oriented to person, place, and time.  Skin: Skin is warm and dry.  Vitals reviewed.  EKG 09/28/2018: Normal sinus rhythm at 60 bpm, normal axis, early R wave progression, cannot exclude posterior infarct. LVH. No evidence of ischemia.       Assessment & Recommendations:   1. Dizziness Patient has been having intermittent episodes of dizziness for the last several years.  Has not had any episodes of syncope recently.  I will place him on 48-hour Holter monitor for further  evaluation and to exclude any arrhythmias as possible etiology for his symptoms.  Physical exam is unremarkable.  I reviewed recently obtained labs from PCP office, all normal.  2. Bradycardia Patient was noted to have marked bradycardia in PCP office; however, is generally bradycardic.  Unsure if this is the etiology for his symptoms.  3. Dyspnea on exertion Patient does have dyspnea on exertion with associated lightheadedness.  In view of his risk factors, particularly his history of cocaine use feel that patient is high risk for CAD.  I will obtain exercise nuclear stress test for further evaluation.  He also needs echocardiogram to exclude any structural abnormalities.  No clinical evidence of heart failure.  4. History  of illicit drug use Patient has previously used cocaine and ecstasy approximately 20 years ago, but is now completely abstinent from any drug use, tobacco use, or alcohol use.  I congratulated him on this provided positive reinforcement.  No changes were made to medications today.  Will await test results and further discuss at his next office visit.   Thank you for referring this patient. Please do not hesitate to contact me for any questions.    *I have discussed this case with Dr. Einar Gip and he personally examined the patient and participated in formulating the plan.*   Jeri Lager, [facility], [place] Office: [phone] Fax: [phone]

## 2018-09-28 NOTE — Telephone Encounter (Signed)
Spoke with patient regarding ECHO and informed him that there were no openings for this today and he was given the number to call and get this scheduled at his convenience ok per Dr. Gwendlyn Deutscher.  Marbella Markgraf,CMA

## 2018-09-28 NOTE — Progress Notes (Signed)
Spoke with patient let him know that there were no openings today for the echo.  Gave him the number to call and get this scheduled at his convenience.  Jazmin Hartsell,CMA

## 2018-09-29 ENCOUNTER — Telehealth: Payer: Self-pay

## 2018-10-06 ENCOUNTER — Ambulatory Visit (HOSPITAL_COMMUNITY)
Admission: RE | Admit: 2018-10-06 | Discharge: 2018-10-06 | Disposition: A | Discharge status: Home / Self Care | Payer: 59 | Source: Ambulatory Visit | Attending: Family Medicine | Admitting: Family Medicine

## 2018-10-06 ENCOUNTER — Ambulatory Visit: Payer: 59

## 2018-10-06 DIAGNOSIS — R0609 Other forms of dyspnea: Secondary | ICD-10-CM

## 2018-10-06 DIAGNOSIS — R079 Chest pain, unspecified: Secondary | ICD-10-CM | POA: Diagnosis not present

## 2018-10-06 DIAGNOSIS — R42 Dizziness and giddiness: Secondary | ICD-10-CM

## 2018-10-06 DIAGNOSIS — R001 Bradycardia, unspecified: Secondary | ICD-10-CM | POA: Diagnosis not present

## 2018-10-06 NOTE — Progress Notes (Signed)
2D Echocardiogram has been performed.  Michael Rogers 10/06/2018, 3:10 PM

## 2018-10-07 DIAGNOSIS — R42 Dizziness and giddiness: Secondary | ICD-10-CM | POA: Diagnosis not present

## 2018-10-12 ENCOUNTER — Ambulatory Visit: Payer: 59 | Admitting: Cardiology

## 2018-10-20 ENCOUNTER — Ambulatory Visit: Payer: 59

## 2018-10-20 ENCOUNTER — Other Ambulatory Visit: Payer: Self-pay

## 2018-10-20 DIAGNOSIS — R0609 Other forms of dyspnea: Principal | ICD-10-CM

## 2018-10-24 NOTE — Progress Notes (Deleted)
Subjective:   @Patient  ID@: Michael Rogers, male    DOB: [birth year], 33 y.o.   MRN: [id number]  Patient referred by Andrena Mews T for dizziness and bradycardia.  No chief complaint on file.   Patient reports that dizziness, fatigue, SOB started several years ago and states that he has always attributed to asthma. Reports on 09/18/2018 had severe episode of lightheadedness with associated shortness of breath that improved with drinking coffee. He later had another episode the same day while at [facility], where he had to sit down to rest in his car and ate something. He was evaluated by his PCP and had an episode while in their office, in which EKG was performed during that time and revealed bradycardia. He has now been paying attention to his symptoms and feels that he has mild symptoms daily. He reports one episode a few years ago where he was leaning over and felt whoozy and briefly lost consciousness, but has not had any recent episodes of syncope. He denies any chest pain.   He does not use inhaler as he feels makes asthma worse. Denies any hypertension, hyperlipidemia, or thyroid problems.   Denies any family history of sudden cardiac death. Denies any alcohol use. Former smoker quit 2013 that smoked for approximatley 10 years. He did former use coccaine for 3 years that quit early 2000's. He also briefly used ectasy.   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    Past Medical History:  Diagnosis Date  . Allergy   . Asthma   . Chest pain   . H. pylori infection   . Prostatitis 05/29/2017  . Puncture wound of right foot 02/28/2016    Past Surgical History:  Procedure Laterality Date  . COLONOSCOPY  2018  . MOUTH SURGERY    . ROOT CANAL      Family History  Problem Relation Age of Onset  . Kidney disease Father   . Hypertension Paternal Grandmother   . Heart disease Paternal Uncle   . Diabetes Maternal Aunt   .  Colon cancer Neg Hx   . Stomach cancer Neg Hx     Social History   Socioeconomic History  . Marital status: Single    Spouse name: Not on file  . Number of children: 1  . Years of education: Not on file  . Highest education level: Not on file  Occupational History  . Occupation: [employer]  Social Needs  . Financial resource strain: Not on file  . Food insecurity:    Worry: Not on file    Inability: Not on file  . Transportation needs:    Medical: Not on file    Non-medical: Not on file  Tobacco Use  . Smoking status: Former Smoker    Packs/day: 0.50    Years: 10.00    Pack years: 5.00    Types: Cigarettes    Start date: 03/17/2013    Last attempt to quit: 09/28/2013    Years since quitting: 5.0  . Smokeless tobacco: Never Used  Substance and Sexual Activity  . Alcohol use: No  . Drug use: Yes    Types: Marijuana    Comment: daily  . Sexual activity: Yes    Partners: Female    Birth control/protection: None  Lifestyle  . Physical activity:    Days per week: Not on file    Minutes per session: Not on file  .  Stress: Not on file  Relationships  . Social connections:    Talks on phone: Not on file    Gets together: Not on file    Attends religious service: Not on file    Active member of club or organization: Not on file    Attends meetings of clubs or organizations: Not on file    Relationship status: Not on file  . Intimate partner violence:    Fear of current or ex partner: Not on file    Emotionally abused: Not on file    Physically abused: Not on file    Forced sexual activity: Not on file  Other Topics Concern  . Not on file  Social History Narrative   Lives with mother.     History of incarceration in 2012.    No current outpatient medications on file prior to visit.   No current facility-administered medications on file prior to visit.      Review of Systems  Constitution: Positive for malaise/fatigue. Negative for decreased appetite, weight  gain and weight loss.  Eyes: Negative for visual disturbance.  Cardiovascular: Positive for dyspnea on exertion. Negative for chest pain, claudication, leg swelling, orthopnea, palpitations and syncope.  Respiratory: Negative for hemoptysis and wheezing.   Endocrine: Negative for cold intolerance and heat intolerance.  Hematologic/Lymphatic: Does not bruise/bleed easily.  Skin: Negative for nail changes.  Musculoskeletal: Negative for muscle weakness and myalgias.  Gastrointestinal: Negative for abdominal pain, change in bowel habit, nausea and vomiting.  Neurological: Positive for light-headedness. Negative for difficulty with concentration, dizziness, focal weakness and headaches.  Psychiatric/Behavioral: Negative for altered mental status and suicidal ideas.  All other systems reviewed and are negative.      Objective:   Physical Exam  Constitutional: He is oriented to person, place, and time. Vital signs are normal. He appears well-developed and well-nourished.  HENT:  Head: Normocephalic and atraumatic.  Neck: Normal range of motion.  Cardiovascular: Normal rate, regular rhythm, normal heart sounds and intact distal pulses.  Pulmonary/Chest: Effort normal and breath sounds normal. No accessory muscle usage. No respiratory distress.  Abdominal: Soft. Bowel sounds are normal.  Musculoskeletal: Normal range of motion.  Neurological: He is alert and oriented to person, place, and time.  Skin: Skin is warm and dry.  Vitals reviewed.  Exercise Treadmill Stress Test 10/20/2018:  Indication: Chest pain, SOB, Fatigue The patient exercised on Bruce protocol for  08:18 min. Patient achieved  10.16 METS and reached HR  164 bpm, which is   87% of maximum age-predicted HR.  Stress test terminated due to dyspnea and dizziness.  Exercise capacity was normal. HR Response to Exercise: Appropriate. BP Response to Exercise: Normal resting BP- appropriate response. Chest Pain: none. Arrhythmias:  none. Resting EKG demonstrates Normal sinus rhythm. ST Changes: With peak exercise there was no ST-T changes of ischemia. Overall Impression: Normal stress test. Recommendations: Continue primary/secondary prevention.  EKG 09/28/2018: Normal sinus rhythm at 60 bpm, normal axis, early R wave progression, cannot exclude posterior infarct. LVH. No evidence of ischemia.       Assessment & Recommendations:   1. Dizziness Patient has been having intermittent episodes of dizziness for the last several years.  Has not had any episodes of syncope recently.  I will place him on 48-hour Holter monitor for further evaluation and to exclude any arrhythmias as possible etiology for his symptoms.  Physical exam is unremarkable.  I reviewed recently obtained labs from PCP office, all normal.  2. Bradycardia Patient  was noted to have marked bradycardia in PCP office; however, is generally bradycardic.  Unsure if this is the etiology for his symptoms.  3. Dyspnea on exertion Patient does have dyspnea on exertion with associated lightheadedness.  In view of his risk factors, particularly his history of cocaine use feel that patient is high risk for CAD.  I will obtain exercise nuclear stress test for further evaluation.  He also needs echocardiogram to exclude any structural abnormalities.  No clinical evidence of heart failure.  4. History of illicit drug use Patient has previously used cocaine and ecstasy approximately 20 years ago, but is now completely abstinent from any drug use, tobacco use, or alcohol use.  I congratulated him on this provided positive reinforcement.  No changes were made to medications today.  Will await test results and further discuss at his next office visit.   Thank you for referring this patient. Please do not hesitate to contact me for any questions.    *I have discussed this case with Dr. Einar Gip and he personally examined the patient and participated in formulating the  plan.*   Jeri Lager, [facility], [place] Office: [phone] Fax: [phone]

## 2018-10-25 ENCOUNTER — Ambulatory Visit: Payer: 59 | Admitting: Cardiology

## 2018-10-27 ENCOUNTER — Ambulatory Visit: Payer: 59 | Admitting: Cardiology

## 2018-10-28 ENCOUNTER — Telehealth: Payer: Self-pay | Admitting: Family Medicine

## 2018-10-28 DIAGNOSIS — R0609 Other forms of dyspnea: Secondary | ICD-10-CM

## 2018-10-28 DIAGNOSIS — R0789 Other chest pain: Secondary | ICD-10-CM | POA: Insufficient documentation

## 2018-10-28 DIAGNOSIS — R0600 Dyspnea, unspecified: Secondary | ICD-10-CM | POA: Insufficient documentation

## 2018-10-28 DIAGNOSIS — R079 Chest pain, unspecified: Secondary | ICD-10-CM | POA: Insufficient documentation

## 2018-10-28 NOTE — Telephone Encounter (Signed)
All record request should go through [facility].

## 2018-10-28 NOTE — Telephone Encounter (Signed)
[facility] called requesting Echo results for patient Michael Rogers.

## 2018-10-29 ENCOUNTER — Ambulatory Visit: Payer: 59

## 2018-10-29 ENCOUNTER — Ambulatory Visit: Payer: 59 | Admitting: Cardiology

## 2018-10-29 ENCOUNTER — Encounter: Payer: Self-pay | Admitting: Cardiology

## 2018-10-29 ENCOUNTER — Telehealth: Payer: Self-pay | Admitting: Family Medicine

## 2018-10-29 ENCOUNTER — Other Ambulatory Visit: Payer: Self-pay

## 2018-10-29 VITALS — BP 121/69 | HR 57 | Ht 69.0 in | Wt 206.1 lb

## 2018-10-29 DIAGNOSIS — R001 Bradycardia, unspecified: Secondary | ICD-10-CM | POA: Diagnosis not present

## 2018-10-29 DIAGNOSIS — Z87898 Personal history of other specified conditions: Secondary | ICD-10-CM | POA: Diagnosis not present

## 2018-10-29 DIAGNOSIS — R42 Dizziness and giddiness: Secondary | ICD-10-CM

## 2018-10-29 DIAGNOSIS — R0609 Other forms of dyspnea: Secondary | ICD-10-CM | POA: Diagnosis not present

## 2018-10-29 DIAGNOSIS — R0789 Other chest pain: Secondary | ICD-10-CM

## 2018-10-29 DIAGNOSIS — F1991 Other psychoactive substance use, unspecified, in remission: Secondary | ICD-10-CM

## 2018-10-29 NOTE — Progress Notes (Signed)
Subjective:   @Patient  ID@: Michael Rogers, male    DOB: July 27, 1986, 33 y.o.   MRN: [id number]  Patient referred by Andrena Mews T for dizziness and bradycardia.  Chief Complaint  Patient presents with  . Bradycardia   HPI: Patient with history of asthma, former tobacco and cocaine use, recently evaluated by [place] for episodes of bradycardia and dizziness and also exertional dyspnea on exertion.   He underwent 48 hour holter monitor that was without evidence of arrhythmias. Treadmill stress testing performed on 10/20/2018 was without EKG changes suggestive of ischemia. Echocardiogram on 10/06/2018 was without any structural abnormalities. He is now here to discuss results.  Patient reports that he did not have any symptoms while wearing the monitor; however, since being off the monitor, he has continued to have sudden episodes of lightheadedness that can even occur while just sitting on the couch. Also continues to have exertional dyspnea and lightheadedness. Has occasional episodes of chest discomfort at rest. No associated pain radiation or shortness of breath. Patient reports that dizziness, fatigue, SOB started several years ago and states that he has always attributed to asthma. No syncope. Denies any hypertension, hyperlipidemia, or thyroid problems.   Denies any family history of sudden cardiac death. Denies any alcohol use. Former smoker quit 2013 that smoked for approximatley 10 years. He did former use coccaine for 3 years that quit early 2000's. He also briefly used ectasy.   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   Past Medical History:  Diagnosis Date  . Allergy   . Asthma   . Chest pain   . H. pylori infection   . Prostatitis 05/29/2017  . Puncture wound of right foot 02/28/2016    Past Surgical History:  Procedure Laterality Date  . COLONOSCOPY  2018  . MOUTH SURGERY    . ROOT CANAL      Family History   Problem Relation Age of Onset  . Kidney disease Father   . Hypertension Paternal Grandmother   . Heart disease Paternal Uncle   . Diabetes Maternal Aunt   . Colon cancer Neg Hx   . Stomach cancer Neg Hx     Social History   Socioeconomic History  . Marital status: Single    Spouse name: Not on file  . Number of children: 1  . Years of education: Not on file  . Highest education level: Not on file  Occupational History  . Occupation: [employer]  Social Needs  . Financial resource strain: Not on file  . Food insecurity:    Worry: Not on file    Inability: Not on file  . Transportation needs:    Medical: Not on file    Non-medical: Not on file  Tobacco Use  . Smoking status: Former Smoker    Packs/day: 0.50    Years: 10.00    Pack years: 5.00    Types: Cigarettes    Start date: 03/17/2013    Last attempt to quit: 09/28/2013    Years since quitting: 5.0  . Smokeless tobacco: Never Used  Substance and Sexual Activity  . Alcohol use: No  . Drug use: Yes    Types: Marijuana    Comment: daily  . Sexual activity: Yes    Partners: Female    Birth control/protection: None  Lifestyle  . Physical activity:    Days per week: Not on file    Minutes per  session: Not on file  . Stress: Not on file  Relationships  . Social connections:    Talks on phone: Not on file    Gets together: Not on file    Attends religious service: Not on file    Active member of club or organization: Not on file    Attends meetings of clubs or organizations: Not on file    Relationship status: Not on file  . Intimate partner violence:    Fear of current or ex partner: Not on file    Emotionally abused: Not on file    Physically abused: Not on file    Forced sexual activity: Not on file  Other Topics Concern  . Not on file  Social History Narrative   Lives with mother.     History of incarceration in 2012.    No current outpatient medications on file prior to visit.   No current  facility-administered medications on file prior to visit.      Review of Systems  Constitution: Positive for malaise/fatigue. Negative for decreased appetite, weight gain and weight loss.  Eyes: Negative for visual disturbance.  Cardiovascular: Positive for chest pain and dyspnea on exertion. Negative for claudication, leg swelling, orthopnea, palpitations and syncope.  Respiratory: Negative for hemoptysis and wheezing.   Endocrine: Negative for cold intolerance and heat intolerance.  Hematologic/Lymphatic: Does not bruise/bleed easily.  Skin: Negative for nail changes.  Musculoskeletal: Negative for muscle weakness and myalgias.  Gastrointestinal: Negative for abdominal pain, change in bowel habit, nausea and vomiting.  Neurological: Positive for light-headedness. Negative for difficulty with concentration, dizziness, focal weakness and headaches.  Psychiatric/Behavioral: Negative for altered mental status and suicidal ideas.  All other systems reviewed and are negative.      Objective:   Physical Exam  Constitutional: He is oriented to person, place, and time. Vital signs are normal. He appears well-developed and well-nourished.  HENT:  Head: Normocephalic and atraumatic.  Neck: Normal range of motion.  Cardiovascular: Normal rate, regular rhythm, normal heart sounds and intact distal pulses.  Pulmonary/Chest: Effort normal and breath sounds normal. No accessory muscle usage. No respiratory distress.  Abdominal: Soft. Bowel sounds are normal.  Musculoskeletal: Normal range of motion.  Neurological: He is alert and oriented to person, place, and time.  Skin: Skin is warm and dry.  Vitals reviewed.  Cardiac studies:  EKG 09/28/2018: Normal sinus rhythm at 60 bpm, normal axis, early R wave progression, cannot exclude posterior infarct. LVH. No evidence of ischemia.   48 hour holter monitor 10/12/2018: NSR. Sinus arrhythmias. No significant arrhythmias. No symptoms reported.    Exercise Treadmill Stress Test 10/20/2018:  Indication: Chest pain, SOB, Fatigue The patient exercised on Bruce protocol for  08:18 min. Patient achieved  10.16 METS and reached HR  164 bpm, which is   87% of maximum age-predicted HR.  Stress test terminated due to dyspnea and dizziness.  Exercise capacity was normal. HR Response to Exercise: Appropriate. BP Response to Exercise: Normal resting BP- appropriate response. Chest Pain: none. Arrhythmias: none. Resting EKG demonstrates Normal sinus rhythm. ST Changes: With peak exercise there was no ST-T changes of ischemia. Overall Impression: Normal stress test. Recommendations: Continue primary/secondary prevention.  Echo 10/06/2018: 1. The left ventricle has normal systolic function, with an ejection fraction of 55-60%. The cavity size was normal. Left ventricular diastolic parameters were normal. No evidence of left ventricular regional wall motion abnormalities.  2. The right ventricle has normal systolic function. The cavity was normal. There  is no increase in right ventricular wall thickness. Right ventricular systolic pressure Normal with an estimated pressure of 19.3 mmHg.      Assessment & Recommendations:   1. Dizziness Continues to have episodes of lightheadedness and dizziness both at exertion and rest. Did not have symptoms on 48 hour holter monitor. Results were discussed with the patient. I will place him on 30 day event monitor for further evaluation to ensure that we are not missing any arrhythmias given lack of symptoms on previous monitor.   2. Bradycardia Chronic. Do not suspect is etiology for his symptoms.   3. Dyspnea on exertion Had excellent exercise capacity by stress test. No structural abnormalities noted on echocardiogram. Suspect his symptoms may be related to his asthma. If cardiac testing is unyielding, could consider pulmonary evaluation. Will also further evaluate with 30 day event monitor as he will be  resuming his normal job while wearing the monitor.  4. History of illicit drug use Continues to remain abstinent. Congratulated him on this and provided positive reinforcement.  5. Atypical chest pain Possibly related to GERD or musculoskeletal etiology. Does not have exertional chest pain, but is high risk for endothelial dysfunction from previous illicit drug use. Could consider nuclear stress testing at some point if clinical suspicion remains high. Will hold off for now and reevaluate with event monitor.   Plan: Will see him back in 6-8 weeks for follow up on his symptoms and to discuss event monitor results.    Jeri Lager, FNP-C [facility], [place] Office: [phone] Fax: [phone]

## 2018-10-29 NOTE — Telephone Encounter (Signed)
I called to go over his ECHo report. He stated that he was seen by his cardiologist today and they have discussed his ECHO report with him. He currently does not have any questions.  He was placed on a 30 days monitor he said.

## 2018-11-01 DIAGNOSIS — Z87898 Personal history of other specified conditions: Secondary | ICD-10-CM | POA: Insufficient documentation

## 2018-11-01 DIAGNOSIS — F1991 Other psychoactive substance use, unspecified, in remission: Secondary | ICD-10-CM | POA: Insufficient documentation

## 2018-11-01 DIAGNOSIS — R42 Dizziness and giddiness: Secondary | ICD-10-CM | POA: Insufficient documentation

## 2018-11-27 DIAGNOSIS — R42 Dizziness and giddiness: Secondary | ICD-10-CM

## 2018-12-13 ENCOUNTER — Ambulatory Visit (INDEPENDENT_AMBULATORY_CARE_PROVIDER_SITE_OTHER): Payer: 59 | Admitting: Cardiology

## 2018-12-13 ENCOUNTER — Other Ambulatory Visit: Payer: Self-pay

## 2018-12-13 ENCOUNTER — Encounter: Payer: Self-pay | Admitting: Cardiology

## 2018-12-13 DIAGNOSIS — R42 Dizziness and giddiness: Secondary | ICD-10-CM | POA: Diagnosis not present

## 2018-12-13 DIAGNOSIS — R0789 Other chest pain: Secondary | ICD-10-CM

## 2018-12-13 DIAGNOSIS — R0609 Other forms of dyspnea: Secondary | ICD-10-CM

## 2018-12-13 NOTE — Progress Notes (Signed)
Primary Physician/Referring:  [name], MD  Patient ID: Michael Rogers, male    DOB: 06-26-1986, 33 y.o.   MRN: [id number]  Chief Complaint  Patient presents with  . Dizziness  . Shortness of Breath  . Follow-up   This visit type was conducted due to national recommendations for restrictions regarding the COVID-19 Pandemic (e.g. social distancing).  This format is felt to be most appropriate for this patient at this time.  All issues noted in this document were discussed and addressed.  No physical exam was performed (except for noted visual exam findings with Telehealth visits).  The patient has consented to conduct a Telehealth visit and understands insurance will be billed.   I discussed the limitations of evaluation and management by telemedicine and the availability of in person appointments. The patient expressed understanding and agreed to proceed.  Virtual Visit via Video Note is as below  I connected with Michael Rogers, on 12/13/18 at 1530 by a video enabled telemedicine application and verified that I am speaking with the correct person using two identifiers.     I have discussed with her regarding the safety during COVID Pandemic and steps and precautions including social distancing with the patient.    HPI: Michael Rogers  is a 33 y.o. male  with asthma, former tobacco and cocaine use, recently evaluated by [place] for episodes of bradycardia and dizziness and also exertional dyspnea on exertion.    He underwent 48 hour holter monitor that was without evidence of arrhythmias. Treadmill stress testing performed on 10/20/2018 was without EKG changes suggestive of ischemia. Echocardiogram on 10/06/2018 was without any structural abnormalities. Due to continued symptoms and not having symptoms while on holter monitor, he was placed on 30 day event monitor that did not show any arrhythmias. Occasional sinus tachycardia likely while active and bradycardia while sleeping.He does  report one episode while wearing the monitor that he states he suddenly felt dizzy, weak, and sweaty. Improved with lying down under a fan. Also states that he has had episodes that resolve with eating.   He continues to have exertional dyspnea and lightheadedness and if he over exerts and is gasping for air notices some chest pain. Also has occasional episodes of chest discomfort at rest that he describes as pressure. Resolves with belching or just waiting. No associated pain radiation or shortness of breath. Patient reports that dizziness, fatigue, SOB started several years ago and states that he has always attributed to asthma; however, did not notice improvement with inhaler use. No syncope. Denies any hypertension, hyperlipidemia, or thyroid problems.    Denies any family history of sudden cardiac death. Denies any alcohol use. Former smoker quit 2013 that smoked for approximatley 10 years. He did former use coccaine for 3 years that quit early 2000's. He also briefly used ectasy.    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    Past Medical History:  Diagnosis Date  . Allergy   . Asthma   . Chest pain   . H. pylori infection   . Prostatitis 05/29/2017  . Puncture wound of right foot 02/28/2016    Past Surgical History:  Procedure Laterality Date  . COLONOSCOPY  2018  . MOUTH SURGERY    . ROOT CANAL      Social History   Socioeconomic History  . Marital status: Single    Spouse name: Not on file  . Number of children:  1  . Years of education: Not on file  . Highest education level: Not on file  Occupational History  . Occupation: [employer]  Social Needs  . Financial resource strain: Not on file  . Food insecurity:    Worry: Not on file    Inability: Not on file  . Transportation needs:    Medical: Not on file    Non-medical: Not on file  Tobacco Use  . Smoking status: Former Smoker    Packs/day: 0.50    Years: 10.00     Pack years: 5.00    Types: Cigarettes    Start date: 03/17/2013    Last attempt to quit: 09/28/2013    Years since quitting: 5.2  . Smokeless tobacco: Never Used  Substance and Sexual Activity  . Alcohol use: Yes    Comment: rarely   . Drug use: Yes    Types: Marijuana    Comment: quit 2020  . Sexual activity: Yes    Partners: Female    Birth control/protection: None  Lifestyle  . Physical activity:    Days per week: Not on file    Minutes per session: Not on file  . Stress: Not on file  Relationships  . Social connections:    Talks on phone: Not on file    Gets together: Not on file    Attends religious service: Not on file    Active member of club or organization: Not on file    Attends meetings of clubs or organizations: Not on file    Relationship status: Not on file  . Intimate partner violence:    Fear of current or ex partner: Not on file    Emotionally abused: Not on file    Physically abused: Not on file    Forced sexual activity: Not on file  Other Topics Concern  . Not on file  Social History Narrative   Lives with mother.     History of incarceration in 2012.    No current outpatient medications on file prior to visit.   No current facility-administered medications on file prior to visit.     Review of Systems  Constitution: Positive for malaise/fatigue. Negative for decreased appetite, weight gain and weight loss.  Eyes: Negative for visual disturbance.  Cardiovascular: Positive for chest pain and dyspnea on exertion. Negative for claudication, leg swelling, orthopnea, palpitations and syncope.  Respiratory: Negative for hemoptysis and wheezing.   Endocrine: Negative for cold intolerance and heat intolerance.  Hematologic/Lymphatic: Does not bruise/bleed easily.  Skin: Negative for nail changes.  Musculoskeletal: Negative for muscle weakness and myalgias.  Gastrointestinal: Negative for abdominal pain, change in bowel habit, nausea and vomiting.   Neurological: Positive for light-headedness. Negative for difficulty with concentration, dizziness, focal weakness and headaches.  Psychiatric/Behavioral: Negative for altered mental status and suicidal ideas.  All other systems reviewed and are negative.     Objective  There were no vitals taken for this visit. There is no height or weight on file to calculate BMI.   *Physical exam not performed as this is a telephone visit*   Physical Exam  Constitutional: He is oriented to person, place, and time. Vital signs are normal. He appears well-developed and well-nourished.  HENT:  Head: Normocephalic and atraumatic.  Neck: Normal range of motion.  Cardiovascular: Normal rate, regular rhythm, normal heart sounds and intact distal pulses.  Pulmonary/Chest: Effort normal and breath sounds normal. No accessory muscle usage. No respiratory distress.  Abdominal: Soft. Bowel sounds  are normal.  Musculoskeletal: Normal range of motion.  Neurological: He is alert and oriented to person, place, and time.  Skin: Skin is warm and dry.  Vitals reviewed.  Radiology: No results found.  Laboratory examination:    CMP Latest Ref Rng & Units 09/21/2018 09/28/2017 08/31/2015  Glucose 65 - 99 mg/dL 84 101(H) 75  BUN 6 - 20 mg/dL 10 11 12   Creatinine 0.76 - 1.27 mg/dL 0.93 0.97 0.97  Sodium 134 - 144 mmol/L 141 139 144  Potassium 3.5 - 5.2 mmol/L 4.4 3.9 4.1  Chloride 96 - 106 mmol/L 99 103 105  CO2 20 - 29 mmol/L 24 26 30   Calcium 8.7 - 10.2 mg/dL 9.8 9.7 10.3  Total Protein 6.0 - 8.5 g/dL 7.4 7.8 -  Total Bilirubin 0.0 - 1.2 mg/dL 0.4 0.5 -  Alkaline Phos 39 - 117 IU/L 79 61 -  AST 0 - 40 IU/L 30 29 -  ALT 0 - 44 IU/L 42 51 -   CBC Latest Ref Rng & Units 09/21/2018 09/28/2017 05/29/2017  WBC 3.4 - 10.8 x10E3/uL 6.7 12.7(H) 5.9  Hemoglobin 13.0 - 17.7 g/dL 15.4 15.9 14.2  Hematocrit 37.5 - 51.0 % 45.2 45.4 41.4  Platelets 150 - 450 x10E3/uL 249 226 213   Lipid Panel  No results found for:  CHOL, TRIG, HDL, CHOLHDL, VLDL, LDLCALC, LDLDIRECT HEMOGLOBIN A1C Lab Results  Component Value Date   HGBA1C 5.2 09/21/2018   TSH Recent Labs    09/21/18 1124  TSH 1.970    Cardiac Studies:   30 day event monitor 03/27-04/25/2020: Normal sinus rhythm. 1 auto detected event for sinus tachycardia at 163 bpm. No symptoms. 1 auto detected event for sinus rhythm/arrhythmia at 0511 on day 14. He was noted to have sinus tachycardia on day 14 at 12 pm with HR 166 bpm with brief aberancy likely rate related, no symptoms reported. Occasional sinus tachycardia. Several sinus bradycardia episodes in early morning hours with lowest heart rate of 38 bpm on 04/15.  No A fib, SVT, or high degree AV block.   48 hour holter monitor 10/12/2018: NSR. Sinus arrhythmias. No significant arrhythmias. No symptoms reported.    Exercise Treadmill Stress Test 10/20/2018:  Indication: Chest pain, SOB, Fatigue The patient exercised on Bruce protocol for  08:18 min. Patient achieved  10.16 METS and reached HR  164 bpm, which is   87% of maximum age-predicted HR.  Stress test terminated due to dyspnea and dizziness.   Exercise capacity was normal. HR Response to Exercise: Appropriate. BP Response to Exercise: Normal resting BP- appropriate response. Chest Pain: none. Arrhythmias: none. Resting EKG demonstrates Normal sinus rhythm. ST Changes: With peak exercise there was no ST-T changes of ischemia.  Overall Impression: Normal stress test. Recommendations: Continue primary/secondary prevention.   Echo 10/06/2018: 1. The left ventricle has normal systolic function, with an ejection fraction of 55-60%. The cavity size was normal. Left ventricular diastolic parameters were normal. No evidence of left ventricular regional wall motion abnormalities.  2. The right ventricle has normal systolic function. The cavity was normal. There is no increase in right ventricular wall thickness. Right ventricular systolic pressure  Normal with an estimated pressure of 19.3 mmHg.   Assessment   Dyspnea on exertion  Atypical chest pain  Dizziness and giddiness  EKG 09/28/2018: Normal sinus rhythm at 60 bpm, normal axis, early R wave progression, cannot exclude posterior infarct. LVH. No evidence of ischemia.     Recommendations:   Have discussed recently  obtained event monitor results with the patient, reports having one episode of dizziness, weakness, and diaphoresis approximately around day 15.  He had initially been referred to [place] for similar symptoms and had been noted to have bradycardia on EKG while having those symptoms in PCP office.  Event monitor did not show any episodes of marked bradycardia during the day, did have bradycardia at night likely while sleeping. He was noted to have sinus tachycardia at 166 bpm with brief rate related aberancey on day 14, but unsure if this was when he had his episode. Do not feel that this should have caused his symptoms regardless. No SVT. I still cannot explain his symptoms by cardiac workup.   As he has had these episodes in the past that have improved with eating, suspect he may have some hypoglycemia.  I have encouraged him to use glucose tablets when he notices symptoms and to eat high-protein meal after episode.  Will continue to follow-up with PCP regarding this. I have asked him to keep a diary of his symptoms including the date, precipitating factors, symptoms, and alleviating factors for better assessment. 48 hour holter monitor and 30 day monitor have both been without arrhythmias.  If we continue to have suspicion for arrhythmic etiology, could consider loop recorder implantation.  He also has symptoms of dizziness and dyspnea on exertion, has had reassuring echocardiogram and treadmill stress test.  I have a low suspicion for CAD as he had good exercise capacity on treadmill stress test without any EKG changes.   He has a history of asthma and also history of tobacco  use and illicit drug use, he may benefit from pulmonary evaluation as possible etiology.  Occasional episodes of chest pain appear to occur after he has been gasping for air and could also be related to asthma.  Has atypical chest pain symptoms and suspect GERD or musculoskeletal etiology.  He reports having some issues with his blood pressure several years ago while in prison, but has recently not had any issues.  Blood pressure has been stable with this in the past.  I would like to see him back in 3 months to follow-up on his symptoms.   *I have discussed this case with Dr. Einar Gip and he participated in formulating the plan.*   Miquel Dunn, MSN, APRN, FNP-C [facility]. [place] Office: [phone] Fax: [phone]

## 2018-12-17 ENCOUNTER — Encounter: Payer: Self-pay | Admitting: Cardiology

## 2018-12-17 ENCOUNTER — Encounter: Payer: Self-pay | Admitting: Family Medicine

## 2018-12-20 ENCOUNTER — Telehealth (INDEPENDENT_AMBULATORY_CARE_PROVIDER_SITE_OTHER): Payer: 59 | Admitting: Family Medicine

## 2018-12-20 ENCOUNTER — Other Ambulatory Visit: Payer: Self-pay

## 2018-12-20 ENCOUNTER — Telehealth: Payer: 59 | Admitting: Family Medicine

## 2018-12-20 ENCOUNTER — Encounter: Payer: Self-pay | Admitting: Family Medicine

## 2018-12-20 DIAGNOSIS — H5789 Other specified disorders of eye and adnexa: Secondary | ICD-10-CM | POA: Diagnosis not present

## 2018-12-20 MED ORDER — DOXYCYCLINE HYCLATE 100 MG PO TABS: 100.0000 mg | ORAL_TABLET | Freq: Two times a day (BID) | ORAL | 0 refills | Status: AC

## 2018-12-20 MED ORDER — VALACYCLOVIR HCL 1 G PO TABS: 1000.0000 mg | ORAL_TABLET | Freq: Two times a day (BID) | ORAL | 0 refills | Status: AC

## 2018-12-20 NOTE — Progress Notes (Signed)
[place] Telemedicine Visit  Patient consented to have virtual visit. Method of visit:Video  Encounter participants: Patient: Michael Rogers - located at Work Provider: Andrena Mews - located at offsite office Others (if applicable): NA  Chief Complaint: Eye problem  HPI:  Eye Problem   The right eye is affected. This is a new problem. Episode onset: Woke up with right eye pain which started 4 days ago. The next day, he noticed some bumps and swelling under his right eyelid with redness, associated with pus. His eyes were closed shot in the morning with yellowish crusting. The problem occurs constantly. The problem has been gradually worsening. There was no injury mechanism (He denies chemical exposure, no injury, no FB contact). The pain is at a severity of 2/10 (Mild dull pain with blinking). The pain is mild (It itches and feels irritated). There is no known exposure to pink eye. He does not wear contacts. Associated symptoms include blurred vision, an eye discharge, a foreign body sensation and itching. Pertinent negatives include no fever. Associated symptoms comments: Eyelid is pink, no red eye. Treatments tried: Clean with dry rag and put oil on it. The treatment provided no relief.   Review of Systems  Constitutional: Negative for fever.  Eyes: Positive for blurred vision, discharge and itching.  Respiratory: Negative.   Skin:       Itchy eyes      Pertinent PMHx: Problem list reviewed  Physical Exam  Constitutional: He is well-developed, well-nourished, and in no distress. No distress.  Eyes: Conjunctivae and EOM are normal. Right eye exhibits no discharge. Left eye exhibits no discharge.  Right lower eyelid swelling with multiple vesicular bumps under his right eyelid. Lower eyelid is mildly red/pink.  Note: On the video, it appears that the affected eye is on the left side. However, with the image he sent me, as well as him confirming, it  is actually on the right.  He said his cell phone video usually switch side the opposite direction.  Pulmonary/Chest: No respiratory distress.  Vitals reviewed.      Assessment/Plan:  Eye swelling, right + lower lid swelling and vesicles. ?? Cellulitis vs Herpes. Rash does not appear to be dermatomal, hence herpes zoster is less likely. As discussed with him, I will treat empirically with A/B as well as Valtrex. I recommended office visit for visual acuity and possible wood lamp exam since he has FB sensation. I advised ED visit if lesion is spreading, since we don't want this to get into his eyes. In person visit scheduled for tomorrow. All questions were answered and he verbalized understanding of all instructions.  NB: Please obtain discharge specimen for HSV if appropriate. If inperson evaluation is suggestive of cellulitis, please have him d/c valtrex.    Time spent during visit with patient: 25 minutes

## 2018-12-20 NOTE — Assessment & Plan Note (Addendum)
+   lower lid swelling and vesicles. ?? Cellulitis vs Herpes. Rash does not appear to be dermatomal, hence herpes zoster is less likely. As discussed with him, I will treat empirically with A/B as well as Valtrex. I recommended office visit for visual acuity and possible wood lamp exam since he has FB sensation. I advised ED visit if lesion is spreading, since we don't want this to get into his eyes. In person visit scheduled for tomorrow. All questions were answered and he verbalized understanding of all instructions.  NB: Please obtain discharge specimen for HSV if appropriate. If inperson evaluation is suggestive of cellulitis, please have him d/c valtrex.

## 2018-12-21 ENCOUNTER — Ambulatory Visit: Payer: 59

## 2018-12-24 ENCOUNTER — Ambulatory Visit: Payer: 59

## 2019-03-02 ENCOUNTER — Encounter: Payer: Self-pay | Admitting: Family Medicine

## 2019-03-02 ENCOUNTER — Telehealth: Payer: Self-pay | Admitting: Family Medicine

## 2019-03-02 NOTE — Telephone Encounter (Signed)
Spoke with patient and he states that he just needs a letter stating that patient does have asthma and that he does have a heart condition.  This would allow for him to practice social distancing.  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   He is working out of town and unable to come in for assessment.  Also we are not doing PFTs per Dr. Valentina Lucks due to Covid.  Patient has an appointment with his cardiologist on 03-15-2019.  Will check with provider to see if patient can get a letter that basically just states his medical conditions only.  Georgian Mcclory,CMA

## 2019-03-02 NOTE — Progress Notes (Signed)
Will give letter about his med problems.    Hartsell, Jazmin M, CMA  You 11 minutes ago (9:57 AM)     Spoke with patient and he states that he just needs a letter stating that patient does have asthma and that he does have a heart condition.  This would allow for him to practice social distancing.  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   He is working out of town and unable to come in for assessment.  Also we are not doing PFTs per Dr. Valentina Lucks due to Covid.  Patient has an appointment with his cardiologist on 03-15-2019.  Will check with provider to see if patient can get a letter that basically just states his medical conditions only.  Jazmin Hartsell,CMA       Documentation      You  [facility] 25 minutes ago (9:42 AM)     Please advise this patient that he has very mild asthma and should be able to wear a mask. He can be infected with COVID-19 while he is not wearing a mask and it may be severe in him given that he has asthma and heart condition.  Regarding a letter for his heart condition, advise him to follow-up with his cardiologist.  If he is having symptoms with his asthma, please have him f/u with me soon. He will need full reassessment including PFT and likely referral to a pulmonologist.  A way around it would be for him to wear mask around people and take it off when he is not around people. Not wearing a mask increases his own risk for infection too.      Documentation     Leggette, Tashira, CMA 35 minutes ago (9:32 AM)     Routed to PCP. Salvatore Marvel, CMA       Documentation     Leggette, Cherrie Distance, Tiger routed conversation to You 35 minutes ago (9:32 AM)    Richrd Humbles routed conversation to [facility] 43 minutes ago (9:25 AM)    Tildon Husky N 43 minutes ago (9:25 AM)     Pt called to see if he could get documents faxed [phone]) sent to his  job site stating that he has asthma, and that he has a heart condition and can not wear a mask. Please give pt a call back.      Documentation     Marlon, Suleiman [phone]  Tildon Husky N 45 minutes ago (9:22 AM)       Recent Patient Communication   Last Update Reason Specialty    Today Results Family Medicine    [facility] Res[facility] Andrena Mews T Open

## 2019-03-02 NOTE — Telephone Encounter (Signed)
Letter faxed to the number provided.

## 2019-03-02 NOTE — Telephone Encounter (Signed)
Routed to PCP. Michael Rogers, CMA  

## 2019-03-02 NOTE — Telephone Encounter (Signed)
Pt called to see if he could get documents faxed [phone]) sent to his job site stating that he has asthma, and that he has a heart condition and can not wear a mask. Please give pt a call back.

## 2019-03-02 NOTE — Telephone Encounter (Signed)
Please advise this patient that he has very mild asthma and should be able to wear a mask. He can be infected with COVID-19 while he is not wearing a mask and it may be severe in him given that he has asthma and heart condition.  Regarding a letter for his heart condition, advise him to follow-up with his cardiologist.  If he is having symptoms with his asthma, please have him f/u with me soon. He will need full reassessment including PFT and likely referral to a pulmonologist.  A way around it would be for him to wear mask around people and take it off when he is not around people. Not wearing a mask increases his own risk for infection too.

## 2019-03-15 ENCOUNTER — Ambulatory Visit (INDEPENDENT_AMBULATORY_CARE_PROVIDER_SITE_OTHER): Payer: 59 | Admitting: Cardiology

## 2019-03-15 ENCOUNTER — Encounter: Payer: Self-pay | Admitting: Cardiology

## 2019-03-15 ENCOUNTER — Other Ambulatory Visit: Payer: Self-pay

## 2019-03-15 VITALS — BP 136/83 | HR 77 | Ht 69.0 in | Wt 200.0 lb

## 2019-03-15 DIAGNOSIS — R0789 Other chest pain: Secondary | ICD-10-CM | POA: Diagnosis not present

## 2019-03-15 DIAGNOSIS — J45909 Unspecified asthma, uncomplicated: Secondary | ICD-10-CM | POA: Diagnosis not present

## 2019-03-15 DIAGNOSIS — R0609 Other forms of dyspnea: Secondary | ICD-10-CM

## 2019-03-15 DIAGNOSIS — R42 Dizziness and giddiness: Secondary | ICD-10-CM

## 2019-03-15 NOTE — Progress Notes (Signed)
Primary Physician/Referring:  Michael Feil, MD  Patient ID: Michael Rogers, male    DOB: 23-Apr-1986, 33 y.o.   MRN: [id number]  Chief Complaint  Patient presents with  . Chest Pain  . Dizziness  . Shortness of Breath  . giddiness  . Follow-up    3 months    HPI: [name]  is a 33 y.o. male  with asthma, former tobacco and cocaine use, recently evaluated by [place] for episodes of bradycardia and dizziness and also exertional dyspnea on exertion.    He underwent 48 hour holter monitor that was without evidence of arrhythmias. Treadmill stress testing performed on 10/20/2018 was without EKG changes suggestive of ischemia. Echocardiogram on 10/06/2018 was without any structural abnormalities. Due to continued symptoms and not having symptoms while on holter monitor, he was placed on 30 day event monitor that did not show any arrhythmias. Occasional sinus tachycardia likely while active and bradycardia while sleeping.He does report one episode while wearing the monitor that he states he suddenly felt dizzy, weak, and sweaty. Improved with lying down under a fan. Also states that he has had episodes that resolve with eating. I had asked him to keep a diary of his symptoms for better assessment.   He now presents for 3 month follow up. He denies any recent episodes like before. States that he has noticed his shortness of breath with really pushing himself. He reports one episode with carrying some dry wall up a flight of stairs that he felt very short of breath, wheezing, and associated chest tightness. States he had to stop and rest to catch his breath. Denies any hypertension, hyperlipidemia, or thyroid problems.    Denies any family history of sudden cardiac death. Denies any alcohol use. Former smoker quit 2013 that smoked for approximatley 10 years. He did former use coccaine for 3 years that quit early 2000's. He also briefly used ectasy.    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    Past Medical History:  Diagnosis Date  . Allergy   . Asthma   . Chest pain   . H. pylori infection   . Prostatitis 05/29/2017  . Puncture wound of right foot 02/28/2016    Past Surgical History:  Procedure Laterality Date  . COLONOSCOPY  2018  . MOUTH SURGERY    . ROOT CANAL      Social History   Socioeconomic History  . Marital status: Single    Spouse name: Not on file  . Number of children: 1  . Years of education: Not on file  . Highest education level: Not on file  Occupational History  . Occupation: [employer]  Social Needs  . Financial resource strain: Not on file  . Food insecurity    Worry: Not on file    Inability: Not on file  . Transportation needs    Medical: Not on file    Non-medical: Not on file  Tobacco Use  . Smoking status: Former Smoker    Packs/day: 0.50    Years: 10.00    Pack years: 5.00    Types: Cigarettes    Start date: 03/17/2013    Quit date: 09/28/2013    Years since quitting: 5.4  . Smokeless tobacco: Never Used  Substance and Sexual Activity  . Alcohol use: Yes    Comment: rarely   . Drug use: Yes    Types: Marijuana    Comment:  quit 2020  . Sexual activity: Yes    Partners: Female    Birth control/protection: None  Lifestyle  . Physical activity    Days per week: Not on file    Minutes per session: Not on file  . Stress: Not on file  Relationships  . Social Herbalist on phone: Not on file    Gets together: Not on file    Attends religious service: Not on file    Active member of club or organization: Not on file    Attends meetings of clubs or organizations: Not on file    Relationship status: Not on file  . Intimate partner violence    Fear of current or ex partner: Not on file    Emotionally abused: Not on file    Physically abused: Not on file    Forced sexual activity: Not on file  Other Topics Concern  . Not on file  Social History Narrative   Lives with  mother.     History of incarceration in 2012.    No current outpatient medications on file prior to visit.   No current facility-administered medications on file prior to visit.     Review of Systems  Constitution: Negative for decreased appetite, malaise/fatigue, weight gain and weight loss.  Eyes: Negative for visual disturbance.  Cardiovascular: Positive for dyspnea on exertion. Negative for chest pain, claudication, leg swelling, orthopnea, palpitations and syncope.  Respiratory: Negative for hemoptysis and wheezing.   Endocrine: Negative for cold intolerance and heat intolerance.  Hematologic/Lymphatic: Does not bruise/bleed easily.  Skin: Negative for nail changes.  Musculoskeletal: Negative for muscle weakness and myalgias.  Gastrointestinal: Negative for abdominal pain, change in bowel habit, nausea and vomiting.  Neurological: Negative for difficulty with concentration, dizziness, focal weakness, headaches and light-headedness.  Psychiatric/Behavioral: Negative for altered mental status and suicidal ideas.  All other systems reviewed and are negative.     Objective  Blood pressure 136/83, pulse 77, height 5\' 9"  (1.753 m), weight 200 lb (90.7 kg), SpO2 97 %. Body mass index is 29.53 kg/m.     Physical Exam  Constitutional: He is oriented to person, place, and time. Vital signs are normal. He appears well-developed and well-nourished.  HENT:  Head: Normocephalic and atraumatic.  Neck: Normal range of motion.  Cardiovascular: Normal rate, regular rhythm, normal heart sounds and intact distal pulses.  Pulmonary/Chest: Effort normal and breath sounds normal. No accessory muscle usage. No respiratory distress.  Abdominal: Soft. Bowel sounds are normal.  Musculoskeletal: Normal range of motion.  Neurological: He is alert and oriented to person, place, and time.  Skin: Skin is warm and dry.  Vitals reviewed.  Radiology: No results found.  Laboratory examination:     CMP Latest Ref Rng & Units 09/21/2018 09/28/2017 08/31/2015  Glucose 65 - 99 mg/dL 84 101(H) 75  BUN 6 - 20 mg/dL 10 11 12   Creatinine 0.76 - 1.27 mg/dL 0.93 0.97 0.97  Sodium 134 - 144 mmol/L 141 139 144  Potassium 3.5 - 5.2 mmol/L 4.4 3.9 4.1  Chloride 96 - 106 mmol/L 99 103 105  CO2 20 - 29 mmol/L 24 26 30   Calcium 8.7 - 10.2 mg/dL 9.8 9.7 10.3  Total Protein 6.0 - 8.5 g/dL 7.4 7.8 -  Total Bilirubin 0.0 - 1.2 mg/dL 0.4 0.5 -  Alkaline Phos 39 - 117 IU/L 79 61 -  AST 0 - 40 IU/L 30 29 -  ALT 0 - 44 IU/L 42 51 -  CBC Latest Ref Rng & Units 09/21/2018 09/28/2017 05/29/2017  WBC 3.4 - 10.8 x10E3/uL 6.7 12.7(H) 5.9  Hemoglobin 13.0 - 17.7 g/dL 15.4 15.9 14.2  Hematocrit 37.5 - 51.0 % 45.2 45.4 41.4  Platelets 150 - 450 x10E3/uL 249 226 213   Lipid Panel  No results found for: CHOL, TRIG, HDL, CHOLHDL, VLDL, LDLCALC, LDLDIRECT HEMOGLOBIN A1C Lab Results  Component Value Date   HGBA1C 5.2 09/21/2018   TSH Recent Labs    09/21/18 1124  TSH 1.970    Cardiac Studies:   30 day event monitor 03/27-04/25/2020: Normal sinus rhythm. 1 auto detected event for sinus tachycardia at 163 bpm. No symptoms. 1 auto detected event for sinus rhythm/arrhythmia at 0511 on day 14. He was noted to have sinus tachycardia on day 14 at 12 pm with HR 166 bpm with brief aberancy likely rate related, no symptoms reported. Occasional sinus tachycardia. Several sinus bradycardia episodes in early morning hours with lowest heart rate of 38 bpm on 04/15.  No A fib, SVT, or high degree AV block.   48 hour holter monitor 10/12/2018: NSR. Sinus arrhythmias. No significant arrhythmias. No symptoms reported.    Exercise Treadmill Stress Test 10/20/2018:  Indication: Chest pain, SOB, Fatigue The patient exercised on Bruce protocol for  08:18 min. Patient achieved  10.16 METS and reached HR  164 bpm, which is   87% of maximum age-predicted HR.  Stress test terminated due to dyspnea and dizziness.   Exercise capacity  was normal. HR Response to Exercise: Appropriate. BP Response to Exercise: Normal resting BP- appropriate response. Chest Pain: none. Arrhythmias: none. Resting EKG demonstrates Normal sinus rhythm. ST Changes: With peak exercise there was no ST-T changes of ischemia.  Overall Impression: Normal stress test. Recommendations: Continue primary/secondary prevention.   Echo 10/06/2018: 1. The left ventricle has normal systolic function, with an ejection fraction of 55-60%. The cavity size was normal. Left ventricular diastolic parameters were normal. No evidence of left ventricular regional wall motion abnormalities.  2. The right ventricle has normal systolic function. The cavity was normal. There is no increase in right ventricular wall thickness. Right ventricular systolic pressure Normal with an estimated pressure of 19.3 mmHg.   Assessment     ICD-10-CM   1. Dyspnea on exertion  R06.09 Ambulatory referral to Pulmonology  2. Atypical chest pain  R07.89   3. Dizziness and giddiness  R42   4. Asthma, unspecified asthma severity, unspecified whether complicated, unspecified whether persistent  J45.909 Ambulatory referral to Pulmonology    EKG 09/28/2018: Normal sinus rhythm at 60 bpm, normal axis, early R wave progression, cannot exclude posterior infarct. LVH. No evidence of ischemia.     Recommendations:   Since last seen by me, he has noticed slight improvement in his symptoms and episodes.  He has noticed that his episodes seem to be more so related with exertion, but has not had significant dizziness recently.  He noticed an episode after climbing 2 flights of stairs where he had to stop and rest due to gasping for air and chest tightness.  Symptoms improve after resting.  I have again reviewed all of his cardiac testing.  I cannot explain his symptoms by previous cardiac work-up.  Given his history of asthma, history of drug use, and history of tobacco use, feel that he should be  evaluated by pulmonary and will make referral for this.  He has had treadmill stress testing with excellent exercise capacity.  If pulmonary evaluation is unyielding, could  consider further cardiac work-up with nuclear stress testing, but my suspicion for CAD is low.  No history of hypertension or hyperlipidemia.  He has remained abstinent from all drug use and tobacco use for the last several years.  Congratulated him on this I will see him back as needed basis, but encouraged him to contact us should symptoms worsen or if pulmonary evaluation is unyielding.   Miquel Dunn, MSN, APRN, FNP-C [facility]. [place] Office: [phone] Fax: [phone]

## 2019-03-20 ENCOUNTER — Encounter: Payer: Self-pay | Admitting: Cardiology

## 2020-04-10 ENCOUNTER — Other Ambulatory Visit: Payer: Self-pay

## 2020-04-10 ENCOUNTER — Encounter: Payer: Self-pay | Admitting: Emergency Medicine

## 2020-04-10 ENCOUNTER — Ambulatory Visit
Admission: EM | Admit: 2020-04-10 | Discharge: 2020-04-10 | Disposition: A | Discharge status: Home / Self Care | Payer: 59 | Attending: Emergency Medicine | Admitting: Emergency Medicine

## 2020-04-10 DIAGNOSIS — Z1152 Encounter for screening for COVID-19: Secondary | ICD-10-CM

## 2020-04-10 DIAGNOSIS — J069 Acute upper respiratory infection, unspecified: Secondary | ICD-10-CM

## 2020-04-10 MED ORDER — FLUTICASONE PROPIONATE 50 MCG/ACT NA SUSP: 1.0000 | Freq: Every day | NASAL | 0 refills | Status: DC

## 2020-04-10 MED ORDER — CETIRIZINE HCL 10 MG PO TABS: 10.0000 mg | ORAL_TABLET | Freq: Every day | ORAL | 0 refills | Status: DC

## 2020-04-10 MED ORDER — ALBUTEROL SULFATE HFA 108 (90 BASE) MCG/ACT IN AERS: 2.0000 | INHALATION_SPRAY | RESPIRATORY_TRACT | 0 refills | Status: DC | PRN

## 2020-04-10 MED ORDER — BENZONATATE 100 MG PO CAPS: 100.0000 mg | ORAL_CAPSULE | Freq: Three times a day (TID) | ORAL | 0 refills | Status: DC

## 2020-04-10 MED ORDER — ACETAMINOPHEN 325 MG PO TABS
650.0000 mg | ORAL_TABLET | Freq: Once | ORAL | Status: AC
  Administered 2020-04-10: 650 mg via ORAL

## 2020-04-10 NOTE — ED Provider Notes (Signed)
[facility]    CSN: [id number] Arrival date & time: 04/10/20  1652      History   Chief Complaint Chief Complaint  Patient presents with  . Cough  . Sore Throat    HPI Michael Rogers is a 34 y.o. male  Presenting for URI symptoms x3 days.  Patient worsening dry cough without change in breathing, chest pain, palpitations.  Also admits to sore throat without difficulty swallowing or choking.  No fever arthralgias, myalgias, lower leg swelling, vomiting, diarrhea.  States his son had a possible Covid exposure last week  Past Medical History:  Diagnosis Date  . Allergy   . Asthma   . Chest pain   . H. pylori infection   . Prostatitis 05/29/2017  . Puncture wound of right foot 02/28/2016    Patient Active Problem List   Diagnosis Date Noted  . Eye swelling, right 12/20/2018  . Dizziness 11/01/2018  . History of illicit drug use 83/38/2505  . Chest pain   . Dyspnea on exertion   . Bradycardia 09/21/2018  . Headache 09/21/2018  . Fatigue 09/21/2018  . Tobacco abuse 02/20/2012  . Asthma, intermittent 01/21/2012  . H. pylori infection 01/21/2012    Past Surgical History:  Procedure Laterality Date  . COLONOSCOPY  2018  . MOUTH SURGERY    . ROOT CANAL         Home Medications    Prior to Admission medications   Medication Sig Start Date End Date Taking? Authorizing Provider  albuterol (VENTOLIN HFA) 108 (90 Base) MCG/ACT inhaler Inhale 2 puffs into the lungs every 4 (four) hours as needed for wheezing or shortness of breath. 04/10/20   Hall-Potvin, [place], PA-C  benzonatate (TESSALON) 100 MG capsule Take 1 capsule (100 mg total) by mouth every 8 (eight) hours. 04/10/20   Hall-Potvin, [place], PA-C  cetirizine (ZYRTEC ALLERGY) 10 MG tablet Take 1 tablet (10 mg total) by mouth daily. 04/10/20   Hall-Potvin, [place], PA-C  fluticasone (FLONASE) 50 MCG/ACT nasal spray Place 1 spray into both nostrils daily. 04/10/20   Hall-Potvin, [place], PA-C     Family History Family History  Problem Relation Age of Onset  . Kidney disease Father   . Hypertension Paternal Grandmother   . Heart disease Paternal Uncle   . Diabetes Maternal Aunt   . Colon cancer Neg Hx   . Stomach cancer Neg Hx     Social History Social History   Tobacco Use  . Smoking status: Former Smoker    Packs/day: 0.50    Years: 10.00    Pack years: 5.00    Types: Cigarettes    Start date: 03/17/2013    Quit date: 09/28/2013    Years since quitting: 6.5  . Smokeless tobacco: Never Used  Vaping Use  . Vaping Use: Never used  Substance Use Topics  . Alcohol use: Yes    Comment: rarely   . Drug use: Yes    Types: Marijuana    Comment: quit 2020     Allergies   Dust mite extract, Other, and Pork-derived products   Review of Systems As per HPI   Physical Exam Triage Vital Signs ED Triage Vitals  Enc Vitals Group     BP      Pulse      Resp      Temp      Temp src      SpO2      Weight      Height  Head Circumference      Peak Flow      Pain Score      Pain Loc      Pain Edu?      Excl. in [place]?    No data found.  Updated Vital Signs BP 130/83 (BP Location: Left Arm)   Pulse 83   Temp 100 F (37.8 C) (Oral)   Resp 18   SpO2 97%   Visual Acuity Right Eye Distance:   Left Eye Distance:   Bilateral Distance:    Right Eye Near:   Left Eye Near:    Bilateral Near:     Physical Exam Constitutional:      General: He is not in acute distress.    Appearance: He is not toxic-appearing or diaphoretic.  HENT:     Head: Normocephalic and atraumatic.     Mouth/Throat:     Mouth: Mucous membranes are moist.     Pharynx: Oropharynx is clear.  Eyes:     General: No scleral icterus.    Conjunctiva/sclera: Conjunctivae normal.     Pupils: Pupils are equal, round, and reactive to light.  Neck:     Comments: Trachea midline, negative JVD Cardiovascular:     Rate and Rhythm: Normal rate and regular rhythm.  Pulmonary:      Effort: Pulmonary effort is normal. No respiratory distress.     Breath sounds: No wheezing.  Musculoskeletal:     Cervical back: Neck supple. No tenderness.  Lymphadenopathy:     Cervical: No cervical adenopathy.  Skin:    Capillary Refill: Capillary refill takes less than 2 seconds.     Coloration: Skin is not jaundiced or pale.     Findings: No rash.  Neurological:     Mental Status: He is alert and oriented to person, place, and time.      UC Treatments / Results  Labs (all labs ordered are listed, but only abnormal results are displayed) Labs Reviewed  NOVEL CORONAVIRUS, NAA    EKG   Radiology No results found.  Procedures Procedures (including critical care time)  Medications Ordered in UC Medications  acetaminophen (TYLENOL) tablet 650 mg (650 mg Oral Given 04/10/20 1726)    Initial Impression / Assessment and Plan / UC Course  I have reviewed the triage vital signs and the nursing notes.  Pertinent labs & imaging results that were available during my care of the patient were reviewed by me and considered in my medical decision making (see chart for details).     Patient afebrile, nontoxic, with SpO2 97%.  Covid PCR pending.  Patient to quarantine until results are back.  We will treat supportively as outlined below.  Return precautions discussed, patient verbalized understanding and is agreeable to plan. Final Clinical Impressions(s) / UC Diagnoses   Final diagnoses:  Encounter for screening for COVID-19  URI with cough and congestion     Discharge Instructions     Your COVID test is pending - it is important to quarantine / isolate at home until your results are back. If you test positive and would like further evaluation for persistent or worsening symptoms, you may schedule an E-visit or virtual (video) visit throughout the [facility] app or website.  PLEASE NOTE: If you develop severe chest pain or shortness of breath please go to the ER or  call 9-1-1 for further evaluation --> DO NOT schedule electronic or virtual visits for this. Please call our office for further guidance /  recommendations as needed.  For information about the Covid vaccine, please visit [email or web address]    ED Prescriptions    Medication Sig Dispense Auth. Provider   cetirizine (ZYRTEC ALLERGY) 10 MG tablet Take 1 tablet (10 mg total) by mouth daily. 30 tablet Hall-Potvin, [place], PA-C   fluticasone (FLONASE) 50 MCG/ACT nasal spray Place 1 spray into both nostrils daily. 16 g Hall-Potvin, [place], PA-C   benzonatate (TESSALON) 100 MG capsule Take 1 capsule (100 mg total) by mouth every 8 (eight) hours. 21 capsule Hall-Potvin, [place], PA-C   albuterol (VENTOLIN HFA) 108 (90 Base) MCG/ACT inhaler Inhale 2 puffs into the lungs every 4 (four) hours as needed for wheezing or shortness of breath. 18 g Hall-Potvin, [place], PA-C     PDMP not reviewed this encounter.   Hall-Potvin, [place], [place] 04/10/20 1811

## 2020-04-10 NOTE — Discharge Instructions (Addendum)
Your COVID test is pending - it is important to quarantine / isolate at home until your results are back. °If you test positive and would like further evaluation for persistent or worsening symptoms, you may schedule an E-visit or virtual (video) visit throughout the [facility] My[facility] app or website. ° °PLEASE NOTE: If you develop severe chest pain or shortness of breath please go to the ER or call 9-1-1 for further evaluation --> DO NOT schedule electronic or virtual visits for this. °Please call our office for further guidance / recommendations as needed. ° °For information about the Covid vaccine, please visit [email or web address] °

## 2020-04-10 NOTE — ED Triage Notes (Signed)
Pt here for cough and sore throat x 3 days; pt sts son had possible covid exposure last week

## 2020-04-11 LAB — SARS-COV-2, NAA 2 DAY TAT

## 2020-04-11 LAB — NOVEL CORONAVIRUS, NAA: SARS-CoV-2, NAA: NOT DETECTED

## 2020-04-12 ENCOUNTER — Telehealth: Payer: Self-pay

## 2020-04-12 NOTE — Telephone Encounter (Signed)
Hello [facility] team,  I am uncertain what the indication for pulmonology referral his. He was last seen more than a year ago s/p cardiac work-up for dizziness and bradycardia. Given hx of asthma, what he needs is PFT which can be set up with Dr. Valentina Lucks. Please help him schedule f/u appointment with me or anyone available soon for further discussion. Thanks.

## 2020-04-12 NOTE — Telephone Encounter (Signed)
Patient calls nurse line to follow up on recent visit to urgent care. Patient tested negative for COVID. Patient reports improvement with symptoms since Urgent Care evaluation. Patient is unable to check temperature at this time. Patient does report having chills, congestion and cough.   Patient is also requesting a referral for pulmonologist. Patient reports he was advised to follow up with pulmonologist from cardiologist.   Strict return/ED precautions given  To PCP  Talbot Grumbling, RN

## 2020-04-13 NOTE — Telephone Encounter (Signed)
Patient scheduled an appt for 04-18-2020.  Michael Rogers,CMA

## 2020-04-17 NOTE — Progress Notes (Signed)
    SUBJECTIVE:   CHIEF COMPLAINT / HPI:   URI symptoms Recently seen at urgent care on 04/10/2020 for URI symptoms for 3 days.  At that time was having symptoms of dry cough and sore throat without dyspnea, chest pain.  Tested negative for Covid. Today, patient reports persistent productive cough of whitish sputum which she has not been improving.  He does note improvement in sore throat, rhinorrhea, and congestion.  Initially had subjective fevers and low-grade temp of 100 F at urgent care visit, now resolved for several days.  Reports benzonatate prescribed at urgent care has not been effective.  Denies sinus tenderness.  Dyspnea  Patient was most recently evaluated by cardiology in 03/15/2019 for bradycardia, dizziness, and dyspnea on exertion. Treadmill stress test and echocardiogram in 2020 were unremarkable.  He also had a Holter monitor and a 30-day event monitor which did not show any arrhythmias. At that visit, he reported SOB with wheezing and chest tightness when really pushing himself. Today, patient reports his dyspnea on exertion has worsened since his cardiology visit.  He reports dyspnea with activities including going up stairs, going on long walks, and sometimes with conversations.   He states he has not been using the albuterol inhaler often; however, he states he has been using it daily with his ongoing likely viral illness, even when asymptomatic. Patient previously worked in a Loss adjuster, chartered for 1 year.  Health maintenance Patient due for influenza and Covid vaccinations.  Refused vaccinations.  PERTINENT  PMH / PSH: asthma, former smoker  OBJECTIVE:   BP 118/70   Pulse 67   Ht 5\' 9"  (1.753 m)   Wt 192 lb 9.6 oz (87.4 kg)   SpO2 99%   BMI 28.44 kg/m   General: Well-appearing male, intermittently coughing, NAD HEENT: MM somewhat dry, posterior oropharynx clear without erythema or exudate, right ear occluded with cerumen, left TM nl CV: RRR, no murmurs Pulm: CTAB,  no wheezes or rales  ASSESSMENT/PLAN:   Dyspnea on exertion Chronic, since childhood.  Reportedly has worsened over the past year.  Extensive cardiac work-up has been unrevealing.  Could be undertreated asthma or possibly some form of interstitial lung disease given his environmental exposures in his prior job at a [employer], though he only worked there for 1 year.  Agree with need for PFTs; however, clinic preference to avoid PFTs at this time given recent surge in COVID-19 cases, so he will need PFTs at some point in the future.  For now, we will add Flovent to treat asthma.  Patient encouraged to use albuterol inhaler prior to strenuous activity. - start Flovent - counseled to use albuterol prior to strenuous activity - will need PFTs in the future   Viral URI Suspect patient has viral upper respiratory infection with persistent cough, approximately day 11 of illness today.  Could possibly have acute bronchitis, but regardless, will treat supportively and would not start antibiotics.  No worsening in symptoms or fever to suggest bacterial sinusitis.  No fever, hypoxia, or rales on exam to suggest pneumonia. - dextromethorphan-guaifenesin for cough - can try honey for cough - d/c benzonatate  Health maintenance Counseled patient on influenza and COVID-19 vaccination.  Patient refused, stating preference for natural remedies and avoidance of vaccines.  Zola Button, MD [place]

## 2020-04-17 NOTE — Patient Instructions (Addendum)
It was nice seeing you today, Anuj.  Today, we talked about your cold symptoms and shortness of breath.   I think you most likely have a viral upper respiratory infection. I have sent you a different cough medication to try. If it isn't covered by insurance, you can get Mucinex DM over the counter. You can also try honey for cough.  For your shortness of breath, I will start you on a new inhaler called Flovent.  It will be 2 puffs twice a day.  It is possible that you have asthma that is undertreated, and this inhaler will help.  Unfortunately, we are trying to avoid pulmonary function tests for the time being given the current surgeon and Covid.  We will have to wait until things settle down before you get your pulmonary function tests.  Please reconsider getting the Covid vaccine and the flu shot.  Please make an appointment with Dr. Gwendlyn Deutscher in 1 month to follow-up.  Stay well, Zola Button, MD

## 2020-04-18 ENCOUNTER — Other Ambulatory Visit: Payer: Self-pay

## 2020-04-18 ENCOUNTER — Ambulatory Visit: Payer: 59 | Admitting: Family Medicine

## 2020-04-18 ENCOUNTER — Encounter: Payer: Self-pay | Admitting: Family Medicine

## 2020-04-18 VITALS — BP 118/70 | HR 67 | Ht 69.0 in | Wt 192.6 lb

## 2020-04-18 DIAGNOSIS — R0600 Dyspnea, unspecified: Secondary | ICD-10-CM

## 2020-04-18 DIAGNOSIS — R0609 Other forms of dyspnea: Secondary | ICD-10-CM

## 2020-04-18 DIAGNOSIS — J4520 Mild intermittent asthma, uncomplicated: Secondary | ICD-10-CM | POA: Diagnosis not present

## 2020-04-18 DIAGNOSIS — Z7189 Other specified counseling: Secondary | ICD-10-CM

## 2020-04-18 DIAGNOSIS — J069 Acute upper respiratory infection, unspecified: Secondary | ICD-10-CM | POA: Diagnosis not present

## 2020-04-18 DIAGNOSIS — Z7185 Encounter for immunization safety counseling: Secondary | ICD-10-CM

## 2020-04-18 MED ORDER — DEXTROMETHORPHAN-GUAIFENESIN 10-200 MG PO CAPS: 2.0000 | ORAL_CAPSULE | Freq: Two times a day (BID) | ORAL | 0 refills | Status: AC | PRN

## 2020-04-18 MED ORDER — FLOVENT HFA 44 MCG/ACT IN AERO: 2.0000 | INHALATION_SPRAY | Freq: Two times a day (BID) | RESPIRATORY_TRACT | 2 refills | Status: DC

## 2020-04-18 NOTE — Assessment & Plan Note (Signed)
Chronic, since childhood.  Reportedly has worsened over the past year.  Extensive cardiac work-up has been unrevealing.  Could be undertreated asthma or possibly some form of interstitial lung disease given his environmental exposures in his prior job at a [employer], though he only worked there for 1 year.  Agree with need for PFTs; however, clinic preference to avoid PFTs at this time given recent surge in COVID-19 cases, so he will need PFTs at some point in the future.  For now, we will add Flovent to treat asthma.  Patient encouraged to use albuterol inhaler prior to strenuous activity. - start Flovent - counseled to use albuterol prior to strenuous activity - will need PFTs in the future

## 2020-05-09 ENCOUNTER — Ambulatory Visit: Payer: 59 | Admitting: Cardiology

## 2020-05-09 ENCOUNTER — Encounter: Payer: Self-pay | Admitting: Cardiology

## 2020-05-09 ENCOUNTER — Other Ambulatory Visit: Payer: Self-pay

## 2020-05-09 VITALS — BP 133/79 | HR 54 | Ht 69.0 in | Wt 194.0 lb

## 2020-05-09 DIAGNOSIS — F1491 Cocaine use, unspecified, in remission: Secondary | ICD-10-CM

## 2020-05-09 DIAGNOSIS — R072 Precordial pain: Secondary | ICD-10-CM

## 2020-05-09 DIAGNOSIS — Z87891 Personal history of nicotine dependence: Secondary | ICD-10-CM

## 2020-05-09 DIAGNOSIS — R0602 Shortness of breath: Secondary | ICD-10-CM

## 2020-05-09 NOTE — Progress Notes (Signed)
Michael Rogers Date of Birth: July 19, 1986 MRN: [id number] Primary Care Provider:Eniola, Phill Myron, MD Former Cardiology Providers: Jeri Lager, APRN, FNP-C Primary Cardiologist: Rex Kras, DO, [facility] (established care 05/09/2020)  Date: 05/09/2020 Last Visit: 03/15/2019   Chief Complaint  Patient presents with   Shortness of Breath   Follow-up   Chest Pain    HPI  Michael Rogers is a 34 y.o.  male who presents to the office with a chief complaint of " shortness of breath and chest pain." Patient's past medical history and cardiovascular risk factors include: Asthma, former tobacco use, history of cocaine use.  Previously seen in the office by Miquel Dunn and now presents for follow-up for symptoms of shortness of breath and chest pain.  Patient states that the symptoms of chest pain started couple days ago.  Pain is located over the left anterior chest wall, symptoms can last anywhere from seconds up to days in duration, at times intermittent, feels like " sting-like sensation," not brought on by effort related activity does not resolve with rest.  Patient states that the shortness of breath is brought on by effort related activities.  He does have underlying asthma but states that it is very well controlled.  He does not follow-up with a pulmonologist.  In the chest pain also at times is pleuritic in nature.  He has had a recent cardiac evaluation with an echo and GXT results which were reviewed with him in great detail at today's visit and noted below for further reference.  FUNCTIONAL STATUS: No structured exercise program or daily routine.   ALLERGIES: Allergies  Allergen Reactions   Dust Mite Extract Itching    Sneezing & Itchiness In Eyes + pollen and grass. .   Other Itching and Other (See Comments)    Patient prefers to take liquid instead of tablets or capsule. Also pt states he is allergic to grass which causes itching & sneezing.   Pork-Derived Products Nausea  Only   MEDICATION LIST PRIOR TO VISIT: Current Outpatient Medications on File Prior to Visit  Medication Sig Dispense Refill   ELDERBERRY PO Take by mouth.     No current facility-administered medications on file prior to visit.    PAST MEDICAL HISTORY: Past Medical History:  Diagnosis Date   Allergy    Asthma    Chest pain    H. pylori infection    Prostatitis 05/29/2017   Puncture wound of right foot 02/28/2016    PAST SURGICAL HISTORY: Past Surgical History:  Procedure Laterality Date   COLONOSCOPY  2018   MOUTH SURGERY     ROOT CANAL      FAMILY HISTORY: The patient's family history includes Diabetes in his maternal aunt; Heart disease in his paternal uncle; Hypertension in his paternal grandmother; Kidney disease in his father.   SOCIAL HISTORY:  The patient  reports that he quit smoking about 6 years ago. His smoking use included cigarettes. He started smoking about 7 years ago. He has a 5.00 pack-year smoking history. He has never used smokeless tobacco. He reports current alcohol use. He reports current drug use. Drug: Marijuana.  Review of Systems  Constitutional: Negative for chills and fever.  HENT: Negative for hoarse voice and nosebleeds.   Eyes: Negative for discharge, double vision and pain.  Cardiovascular: Negative for chest pain, claudication, dyspnea on exertion, leg swelling, near-syncope, orthopnea, palpitations, paroxysmal nocturnal dyspnea and syncope.  Respiratory: Negative for hemoptysis and shortness of breath.   Musculoskeletal: Negative for  muscle cramps and myalgias.  Gastrointestinal: Negative for abdominal pain, constipation, diarrhea, hematemesis, hematochezia, melena, nausea and vomiting.  Neurological: Negative for dizziness and light-headedness.    PHYSICAL EXAM: Vitals with BMI 05/18/2020 05/09/2020 04/18/2020  Height 5\' 9"  5\' 9"  5\' 9"   Weight 194 lbs 194 lbs 192 lbs 10 oz  BMI 28.64 74.25 95.63  Systolic 875 643 329    Diastolic 74 79 70  Pulse 70 54 67   No data found. CONSTITUTIONAL: Well-developed and well-nourished. No acute distress.  SKIN: Skin is warm and dry. No rash noted. No cyanosis. No pallor. No jaundice HEAD: Normocephalic and atraumatic.  EYES: No scleral icterus MOUTH/THROAT: Moist oral membranes.  NECK: No JVD present. No thyromegaly noted. No carotid bruits  LYMPHATIC: No visible cervical adenopathy.  CHEST Normal respiratory effort. No intercostal retractions  LUNGS: Clear to auscultation bilaterally.  No stridor. No wheezes. No rales.  CARDIOVASCULAR: Regular rate and rhythm, positive S1-S2, no murmurs rubs or gallops appreciated. ABDOMINAL: Nonobese, soft, nontender,, positive bowel sounds in all 4 quadrants no apparent ascites.  EXTREMITIES: No peripheral edema  HEMATOLOGIC: No significant bruising NEUROLOGIC: Oriented to person, place, and time. Nonfocal. Normal muscle tone.  PSYCHIATRIC: Normal mood and affect. Normal behavior. Cooperative  CARDIAC DATABASE: EKG: 05/09/2020: Sinus bradycardia, 57 bpm, normal axis, without underlying ischemia or injury pattern.  No significant change compared to prior ECG.  Echocardiogram: 10/06/2018: LVEF 55-60%, cavity size normal, normal diastolic function, no regional wall motion abnormalities, RVSP 19 mmHg.,  No significant valvular heart disease.  Stress Testing:  Exercise Treadmill Stress Test 10/20/2018: Indication: Chest pain, SOB, Fatigue The patient exercised on Bruce protocol for  08:18 min. Patient achieved  10.16 METS and reached HR  164 bpm, which is   87% of maximum age-predicted HR.  Stress test terminated due to dyspnea and dizziness.  Exercise capacity was normal. HR Response to Exercise: Appropriate. BP Response to Exercise: Normal resting BP- appropriate response. Chest Pain: none. Arrhythmias: none. Resting EKG demonstrates Normal sinus rhythm. ST Changes: With peak exercise there was no ST-T changes of  ischemia.  Overall Impression: Normal stress test. Recommendations: Continue primary/secondary prevention.  Heart Catheterization: None  30 day event monitor 03/27-04/25/2020: Normal sinus rhythm. 1 auto detected event for sinus tachycardia at 163 bpm. No symptoms. 1 auto detected event for sinus rhythm/arrhythmia at 0511 on day 14. He was noted to have sinus tachycardia on day 14 at 12 pm with HR 166 bpm with brief aberancy likely rate related, no symptoms reported. Occasional sinus tachycardia. Several sinus bradycardia episodes in early morning hours with lowest heart rate of 38 bpm on 04/15. No A fib, SVT, or high degree AV block.  LABORATORY DATA: CBC Latest Ref Rng & Units 09/21/2018 09/28/2017 05/29/2017  WBC 3.4 - 10.8 x10E3/uL 6.7 12.7(H) 5.9  Hemoglobin 13.0 - 17.7 g/dL 15.4 15.9 14.2  Hematocrit 37.5 - 51.0 % 45.2 45.4 41.4  Platelets 150 - 450 x10E3/uL 249 226 213    CMP Latest Ref Rng & Units 09/21/2018 09/28/2017 08/31/2015  Glucose 65 - 99 mg/dL 84 101(H) 75  BUN 6 - 20 mg/dL 10 11 12   Creatinine 0.76 - 1.27 mg/dL 0.93 0.97 0.97  Sodium 134 - 144 mmol/L 141 139 144  Potassium 3.5 - 5.2 mmol/L 4.4 3.9 4.1  Chloride 96 - 106 mmol/L 99 103 105  CO2 20 - 29 mmol/L 24 26 30   Calcium 8.7 - 10.2 mg/dL 9.8 9.7 10.3  Total Protein 6.0 - 8.5 g/dL  7.4 7.8 -  Total Bilirubin 0.0 - 1.2 mg/dL 0.4 0.5 -  Alkaline Phos 39 - 117 IU/L 79 61 -  AST 0 - 40 IU/L 30 29 -  ALT 0 - 44 IU/L 42 51 -    Lipid Panel  No results found for: CHOL, TRIG, HDL, CHOLHDL, VLDL, LDLCALC, LDLDIRECT, LABVLDL  Lab Results  Component Value Date   HGBA1C 5.2 09/21/2018   No components found for: NTPROBNP Lab Results  Component Value Date   TSH 1.970 09/21/2018    Cardiac Panel (last 3 results) No results for input(s): CKTOTAL, CKMB, TROPONINIHS, RELINDX in the last 72 hours.  IMPRESSION:    ICD-10-CM   1. Precordial chest pain  R07.2 EKG 12-Lead  2. Shortness of breath  R06.02   3. Former  smoker  Z87.891   4. History of cocaine use  Z87.898      RECOMMENDATIONS: Michael Rogers is a 34 y.o. male whose past medical history and cardiovascular risk factors include: Asthma, former tobacco use, history of cocaine use.  Precordial chest pain and shortness of breath: Patient symptoms of chest discomfort appear to be atypical in nature and his shortness of breath has been chronic and stable.  He has undergone a recent evaluation from a cardiovascular standpoint including an echocardiogram and exercise treadmill stress test.  Results were reviewed with him in great detail today.  He understands that his LVEF was noted to be within normal limits with normal relaxation of the heart and no significant valvular heart disease.  He also had good functional capacity for age and achieved greater than 10 METS on exercise treadmill stress test and ECG response was negative for ischemia.  Recommend that he see pulmonology given his history of asthma, history of smoking and cocaine use.  If pulmonary evaluation is unremarkable will discuss possibly undergoing coronary CTA for further evaluation and management.  Patient is agreeable with the plan of care as discussed above.  FINAL MEDICATION LIST END OF ENCOUNTER: No orders of the defined types were placed in this encounter.   Medications Discontinued During This Encounter  Medication Reason   albuterol (VENTOLIN HFA) 108 (90 Base) MCG/ACT inhaler Completed Course   fluticasone (FLOVENT HFA) 44 MCG/ACT inhaler Completed Course     Current Outpatient Medications:    ELDERBERRY PO, Take by mouth., Disp: , Rfl:    albuterol (VENTOLIN HFA) 108 (90 Base) MCG/ACT inhaler, Inhale 2 puffs into the lungs every 6 (six) hours as needed for wheezing or shortness of breath., Disp: 8 g, Rfl: 2   cetirizine HCl (ZYRTEC) 5 MG/5ML SOLN, Take 10 mLs (10 mg total) by mouth daily., Disp: 300 mL, Rfl: 2  Orders Placed This Encounter  Procedures   EKG  12-Lead   --Continue cardiac medications as reconciled in final medication list. --Return in about 8 weeks (around 07/04/2020) for Follow up chest pain and shortness of breath . Or sooner if needed. --Continue follow-up with your primary care physician regarding the management of your other chronic comorbid conditions.  Patient's questions and concerns were addressed to his satisfaction. He voices understanding of the instructions provided during this encounter.   This note was created using a voice recognition software as a result there may be grammatical errors inadvertently enclosed that do not reflect the nature of this encounter. Every attempt is made to correct such errors.  Rex Kras, [place], [facility]  Pager: [phone] Office: [phone]

## 2020-05-10 ENCOUNTER — Telehealth: Payer: Self-pay

## 2020-05-10 NOTE — Telephone Encounter (Signed)
I reviewed his cardiologist's note. No mention of pulm referral. Please help him schedule follow-up with me soon to discuss. ED precaution if symptomatic.

## 2020-05-10 NOTE — Telephone Encounter (Signed)
Patient calls nurse line requesting referral to pulmonologist. Patient was seen in the cardiologist office yesterday and patient was advised to reach out to PCP regarding referral.   To PCP  Talbot Grumbling, RN

## 2020-05-11 NOTE — Telephone Encounter (Signed)
Appt made for 05-18-20.  Owenn Rothermel,CMA

## 2020-05-18 ENCOUNTER — Ambulatory Visit (HOSPITAL_COMMUNITY)
Admission: RE | Admit: 2020-05-18 | Discharge: 2020-05-18 | Disposition: A | Discharge status: Home / Self Care | Payer: 59 | Source: Ambulatory Visit | Attending: Family Medicine | Admitting: Family Medicine

## 2020-05-18 ENCOUNTER — Other Ambulatory Visit: Payer: Self-pay

## 2020-05-18 ENCOUNTER — Encounter: Payer: Self-pay | Admitting: Family Medicine

## 2020-05-18 ENCOUNTER — Ambulatory Visit (INDEPENDENT_AMBULATORY_CARE_PROVIDER_SITE_OTHER): Payer: 59 | Admitting: Family Medicine

## 2020-05-18 VITALS — BP 130/74 | HR 70 | Ht 69.0 in | Wt 194.0 lb

## 2020-05-18 DIAGNOSIS — R0600 Dyspnea, unspecified: Secondary | ICD-10-CM | POA: Diagnosis not present

## 2020-05-18 DIAGNOSIS — Z1159 Encounter for screening for other viral diseases: Secondary | ICD-10-CM | POA: Diagnosis not present

## 2020-05-18 DIAGNOSIS — R053 Chronic cough: Secondary | ICD-10-CM | POA: Insufficient documentation

## 2020-05-18 DIAGNOSIS — H547 Unspecified visual loss: Secondary | ICD-10-CM | POA: Diagnosis not present

## 2020-05-18 MED ORDER — ALBUTEROL SULFATE HFA 108 (90 BASE) MCG/ACT IN AERS: 2.0000 | INHALATION_SPRAY | Freq: Four times a day (QID) | RESPIRATORY_TRACT | 2 refills | Status: DC | PRN

## 2020-05-18 MED ORDER — CETIRIZINE HCL 5 MG/5ML PO SOLN: 10.0000 mg | Freq: Every day | ORAL | 2 refills | Status: DC

## 2020-05-18 NOTE — Patient Instructions (Addendum)
  It was nice seeing you today. I think you might be having allergy issues. We will trial allergy medicine and see if that help. I will also like you to get a lung function test to assess for COPD. This can be scheduled today in our office with Dr. Valentina Lucks. I have placed your referral to an eye doctor. Please call if you have additional questions.  Shortness of Breath, Adult Shortness of breath means you have trouble breathing. Shortness of breath could be a sign of a medical problem. Follow these instructions at home:   Watch for any changes in your symptoms.  Do not use any products that contain nicotine or tobacco, such as cigarettes, e-cigarettes, and chewing tobacco.  Do not smoke. Smoking can cause shortness of breath. If you need help to quit smoking, ask your doctor.  Avoid things that can make it harder to breathe, such as: ? Mold. ? Dust. ? Air pollution. ? Chemical smells. ? Things that can cause allergy symptoms (allergens), if you have allergies.  Keep your living space clean. Use products that help remove mold and dust.  Rest as needed. Slowly return to your normal activities.  Take over-the-counter and prescription medicines only as told by your doctor. This includes oxygen therapy and inhaled medicines.  Keep all follow-up visits as told by your doctor. This is important. Contact a doctor if:  Your condition does not get better as soon as expected.  You have a hard time doing your normal activities, even after you rest.  You have new symptoms. Get help right away if:  Your shortness of breath gets worse.  You have trouble breathing when you are resting.  You feel light-headed or you pass out (faint).  You have a cough that is not helped by medicines.  You cough up blood.  You have pain with breathing.  You have pain in your chest, arms, shoulders, or belly (abdomen).  You have a fever.  You cannot walk up stairs.  You cannot exercise the way you  normally do. These symptoms may represent a serious problem that is an emergency. Do not wait to see if the symptoms will go away. Get medical help right away. Call your local emergency services (911 in the [place] Do not drive yourself to the hospital. Summary  Shortness of breath is when you have trouble breathing enough air. It can be a sign of a medical problem.  Avoid things that make it hard for you to breathe, such as smoking, pollution, mold, and dust.  Watch for any changes in your symptoms. Contact your doctor if you do not get better or you get worse. This information is not intended to replace advice given to you by your health care provider. Make sure you discuss any questions you have with your health care provider. Document Revised: 12/21/2017 Document Reviewed: 12/21/2017 Elsevier Patient Education  Wise.

## 2020-05-18 NOTE — Progress Notes (Signed)
    SUBJECTIVE:   CHIEF COMPLAINT / HPI:   Breathing Problem He complains of cough, difficulty breathing, shortness of breath and wheezing. There is no chest tightness. Chronicity: Persistent SOB and intermittent coughin for about a year. The current episode started more than 1 year ago. The problem occurs intermittently. The problem has been gradually worsening. The cough is productive of sputum. Associated symptoms include dyspnea on exertion. Pertinent negatives include no appetite change, orthopnea or PND. Associated symptoms comments: Snores at night.. Exacerbated by: Aggravated by exertion, walking, climbing, any form of exertion. Will improve with rest.  His symptoms are alleviated by rest.  Last time smoked he tobacco was about a year ago, he smokes CBD products. He smoked about 1/2 a pack per week for about 15 years.  Eye problem: He was told he had severe cataract in both eyes by his previous eye doctor at [facility]. He went to [facility] because his vision was getting worse, he was given and eyeglasses and was told he has cataract. No eye pain, or redness. He requested referral to an ophthalmologist.   PERTINENT  PMH / [place]: PMX reviewed  OBJECTIVE:   BP 130/74   Pulse 70   Ht 5\' 9"  (1.753 m)   Wt 194 lb (88 kg)   SpO2 98%   BMI 28.65 kg/m   Physical Exam Vitals and nursing note reviewed.  Eyes:     General: Lids are normal.        Right eye: No discharge.        Left eye: No discharge.     Extraocular Movements: Extraocular movements intact.     Conjunctiva/sclera: Conjunctivae normal.  Cardiovascular:     Rate and Rhythm: Normal rate and regular rhythm.     Heart sounds: Normal heart sounds. No murmur heard.   Pulmonary:     Effort: Pulmonary effort is normal. No respiratory distress.     Breath sounds: Normal breath sounds. No wheezing or rhonchi.      ASSESSMENT/PLAN:   Dyspnea Chronic and worsening. Hx of tobacco abuse. ?? COPD. He also has a remote hx of  asthma. We will obtain PFT. I instructed him to schedule PFT appointment today with Dr. Valentina Lucks. Chest xray ordered. Could also be allergy. I encouraged him to restart his Zyrtec. He requested a liquid form and it was escribed.  He could also obtain this OTC. Albuterol prn rescribed. F/U as needed.   Eye problem: ?? Cataract per the patient. No record to review from [facility] eye center. I placed referral to an ophthalmologist.  HM: Hep C screening done.  Andrena Mews, MD [place]

## 2020-05-18 NOTE — Assessment & Plan Note (Signed)
Chronic and worsening. Hx of tobacco abuse. ?? COPD. He also has a remote hx of asthma. We will obtain PFT. I instructed him to schedule PFT appointment today with Dr. Valentina Lucks. Chest xray ordered. Could also be allergy. I encouraged him to restart his Zyrtec. He requested a liquid form and it was escribed.  He could also obtain this OTC. Albuterol prn rescribed. F/U as needed.

## 2020-05-19 LAB — HEPATITIS C ANTIBODY: Hep C Virus Ab: <0.1 s/co ratio (ref 0.0–0.9)

## 2020-05-22 ENCOUNTER — Telehealth: Payer: Self-pay | Admitting: Family Medicine

## 2020-05-22 MED ORDER — ALBUTEROL SULFATE HFA 108 (90 BASE) MCG/ACT IN AERS: 2.0000 | INHALATION_SPRAY | Freq: Four times a day (QID) | RESPIRATORY_TRACT | 2 refills | Status: DC | PRN

## 2020-05-22 MED ORDER — CETIRIZINE HCL 5 MG/5ML PO SOLN
10.0000 mg | Freq: Every day | ORAL | 2 refills | Status: DC
Start: 2020-05-22 — End: 2022-02-14

## 2020-05-22 NOTE — Telephone Encounter (Signed)
Xray result discussed with him. Continue Albuterol as needed. F/U with PFT as planned.

## 2020-06-07 ENCOUNTER — Encounter: Payer: Self-pay | Admitting: Pharmacist

## 2020-06-07 ENCOUNTER — Ambulatory Visit (INDEPENDENT_AMBULATORY_CARE_PROVIDER_SITE_OTHER): Payer: 59 | Admitting: Pharmacist

## 2020-06-07 ENCOUNTER — Other Ambulatory Visit: Payer: Self-pay

## 2020-06-07 VITALS — BP 128/86 | HR 67 | Ht 69.0 in | Wt 200.0 lb

## 2020-06-07 DIAGNOSIS — R053 Chronic cough: Secondary | ICD-10-CM | POA: Insufficient documentation

## 2020-06-07 DIAGNOSIS — J4520 Mild intermittent asthma, uncomplicated: Secondary | ICD-10-CM | POA: Diagnosis not present

## 2020-06-07 DIAGNOSIS — Z720 Tobacco use: Secondary | ICD-10-CM

## 2020-06-07 NOTE — Patient Instructions (Signed)
Nice to meet you today.    Your lung function test showed near normal lung function with only a small amount of improvement after the nebulizer treatment.   Remember to use your Peak Flow Meter when you start experiencing trouble breathing and write down results on log and bring to next visit.  Try to minimize smoking CBD products  See you in 2-3 weeks!

## 2020-06-07 NOTE — Progress Notes (Signed)
   S:    Patient arrives in good spirits, ambulating without asistance. Presents for lung function evaluation.   Patient was referred and last seen by Primary Care Provider, Dr. Gwendlyn Rogers on 10/15/202.  Patient reports breathing has been problematic with dyspnea upon climbing stairs at work on a daily basis.   Patient reports history of significant bronchoconstriction resulting in inability to catch-breath for as long as an hour. He admits that he could not exercise, even running down a basketball court, for any amount of exercise.    Patient denies atopic sx consistent with atopic dermatitis and allergic rhinitis.  Patient reports adherence to medications - using Albuterol MDI - daily and PRN.  Patient reports last dose of asthma medications was yesterday - denies use today. Current asthma medications: only albuterol PRN Previous history of Flovent and Advair use  Level of asthma sx control- in the last 4 weeks: Question Scoring Patient Score  Daytime sx more than twice a week Yes (1) No (0) 1  Any nighttime waking due to asthma Yes (1) No (0) 1  Reliever needed >2x/week Yes (1) No (0) 1  Any activity limitation due to asthma Yes (1) No (0) 1 stopped often with minor exertion   Total Score   Well controlled - 0, partly controlled - 1-2, uncontrolled 3-4  O: Physical Exam Constitutional:      Appearance: Normal appearance. He is normal weight.  Pulmonary:     Effort: Pulmonary effort is normal. No respiratory distress.  Neurological:     Mental Status: He is alert.  Psychiatric:        Mood and Affect: Mood normal.        Behavior: Behavior normal.        Thought Content: Thought content normal.    Review of Systems  Respiratory: Positive for cough, sputum production, shortness of breath and wheezing.        Experiences wheezing frequently.   Neurological: Positive for dizziness.  All other systems reviewed and are negative.   Vitals:   06/07/20 0854  BP: 128/86    Pulse: 67  SpO2: 94%    See "scanned report" or Documentation Flowsheet (discrete results - PFTs) for  Spirometry results. Patient provided good effort while attempting spirometry.   Lung Age = 34 Albuterol Neb  Lot# [id number]     Exp. 08/03/2020  A/P: Patient has been experiencing dyspnea with exertion. He has a history of asthma and is taking albuterol MDI daily and prn. Medication adherence reported as good. Spirometry evaluation with pre- and post-bronchodilator reveals "normal lung function" on pre-bronchodilator. Provided patient with a peak flow meter to document score when experiencing dyspnea or trouble breathing and to bring log to next visit. Encouraged patient to refrain from smoking CBD products as this may also help improve breathing. Patient verbalized understanding. -Continued Albuterol PRN -Reviewed results of pulmonary function tests.  -Patient provided with and educated on use of peak flow meter.  Patient verbalized understanding of results and education.  Written pt instructions provided.   F/U Rx Clinic visit in 2-3 weeks.  At that time, additional asthma controller may be appropriate.   Total time in face to face counseling 25 minutes.  Patient seen with Adria Dill, PharmD Candidate, Dimple Nanas, PharmD - PGY-1 Resident and Lorel Monaco, PharmD, PGY2 Pharmacy Resident.

## 2020-06-07 NOTE — Assessment & Plan Note (Signed)
Patient reports history of tobacco smoking but now states he smokes HEMP with CBD 5-7 times per day.  Advised that any inhaled substance could be irritating to his lung and could be cause of thickened sputum.  Advised to quit all inhalation products.

## 2020-06-07 NOTE — Progress Notes (Signed)
Reviewed: I agree with Dr. Koval's documentation and management. 

## 2020-06-07 NOTE — Assessment & Plan Note (Signed)
Patient has been experiencing dyspnea with exertion. He has a history of asthma and is taking albuterol MDI daily and prn. Medication adherence reported as good. Spirometry evaluation with pre- and post-bronchodilator reveals "normal lung function" on pre-bronchodilator. Provided patient with a peak flow meter to document score when experiencing dyspnea or trouble breathing and to bring log to next visit. Encouraged patient to refrain from smoking CBD products as this may also help improve breathing. Patient verbalized understanding. -Continued Albuterol PRN -Reviewed results of pulmonary function tests.  -Patient provided with and educated on use of peak flow meter.  Patient verbalized understanding of results and education.  Written pt instructions provided.   F/U Rx Clinic visit in 2-3 weeks.  At that time, additional asthma controller may be appropriate.

## 2020-06-12 ENCOUNTER — Other Ambulatory Visit: Payer: Self-pay

## 2020-06-12 ENCOUNTER — Encounter: Payer: Self-pay | Admitting: Family Medicine

## 2020-06-12 ENCOUNTER — Ambulatory Visit: Payer: 59 | Admitting: Family Medicine

## 2020-06-12 VITALS — BP 102/62 | HR 63 | Ht 69.0 in | Wt 196.5 lb

## 2020-06-12 DIAGNOSIS — J4520 Mild intermittent asthma, uncomplicated: Secondary | ICD-10-CM

## 2020-06-12 DIAGNOSIS — R0602 Shortness of breath: Secondary | ICD-10-CM | POA: Diagnosis not present

## 2020-06-12 DIAGNOSIS — R053 Chronic cough: Secondary | ICD-10-CM | POA: Diagnosis not present

## 2020-06-12 MED ORDER — BUDESONIDE-FORMOTEROL FUMARATE 160-4.5 MCG/ACT IN AERO: 2.0000 | INHALATION_SPRAY | Freq: Two times a day (BID) | RESPIRATORY_TRACT | 12 refills | Status: DC

## 2020-06-12 NOTE — Progress Notes (Signed)
    SUBJECTIVE:   CHIEF COMPLAINT / HPI:   Cough This is a chronic problem. Episode onset: Cough started more than 3 months ago. The problem has been waxing and waning (Becoming more persistent). Cough characteristics: Lately, he has been producing clear sputum on few occasions. Associated symptoms include chest pain, shortness of breath and wheezing. Pertinent negatives include no fever, heartburn or hemoptysis. Associated symptoms comments: He had left sided rib pain yesterday which has improved today. He also endorses intermittent right sided chest pain with coughing. Hx of heart burn in the past, no recent GI issues. He endorses persistent SOB with his cough and on exertion. He also wheezes with prolonged coughing.. The symptoms are aggravated by exercise and lying down (Sometimes, no aggravating factors). Risk factors for lung disease include smoking/tobacco exposure. He has tried a beta-agonist inhaler for the symptoms. The treatment provided mild relief. His past medical history is significant for asthma.  Sometimes his cough and SOB are worse at night.   PERTINENT  PMH / PSH: PMX reviewed  OBJECTIVE:   BP 102/62   Pulse 63   Ht 5\' 9"  (1.753 m)   Wt 196 lb 8 oz (89.1 kg)   SpO2 97%   BMI 29.02 kg/m   Physical Exam Vitals and nursing note reviewed.  Cardiovascular:     Rate and Rhythm: Normal rate and regular rhythm.     Heart sounds: Normal heart sounds. No murmur heard.   Pulmonary:     Effort: Pulmonary effort is normal. No respiratory distress.     Breath sounds: Normal breath sounds. No wheezing or rhonchi.  Chest:     Chest wall: No tenderness.  Abdominal:     General: Abdomen is flat. Bowel sounds are normal. There is no distension.     Palpations: Abdomen is soft. There is no mass.     Tenderness: There is no abdominal tenderness.  Musculoskeletal:     Right lower leg: No edema.     Left lower leg: No edema.      ASSESSMENT/PLAN:   Chronic cough May be  related to his asthma. GERD discussed, but no current symptoms. Consider PPI in the future if all else fails. PFT report reviewed and was discussed with him. Recent ECHO report reviewed and was discussed with him. Smoking cessation counseling done. Start Symbicort inhaled BID plus albuterol prn. CT chest vs pulm referral discussed. He opted for CT chest for now. CT ordered to assess for pulmonary fibrosis/sarcoidosis. I will contact him with the result. ED precaution discussed.   Mild intermittent asthma without complication Albuterol prn. Controller meds started - Symbicort.     Andrena Mews, MD [place]

## 2020-06-12 NOTE — Assessment & Plan Note (Signed)
May be related to his asthma. GERD discussed, but no current symptoms. Consider PPI in the future if all else fails. PFT report reviewed and was discussed with him. Recent ECHO report reviewed and was discussed with him. Smoking cessation counseling done. Start Symbicort inhaled BID plus albuterol prn. CT chest vs pulm referral discussed. He opted for CT chest for now. CT ordered to assess for pulmonary fibrosis/sarcoidosis. I will contact him with the result. ED precaution discussed.

## 2020-06-12 NOTE — Patient Instructions (Addendum)
Budesonide; Formoterol Inhalation What is this medicine? BUDESONIDE; FORMOTEROL (byoo DES oh nide; for Arabi te rol) inhalation is a combination of 2 medicines that decrease inflammation and help to open up the airways in your lungs. It is used to treat asthma. It is also used to treat chronic obstructive pulmonary disease (COPD), including chronic bronchitis or emphysema. Do NOT use for an acute asthma or COPD attack. This medicine may be used for other purposes; ask your health care provider or pharmacist if you have questions. COMMON BRAND NAME(S): Symbicort What should I tell my health care provider before I take this medicine? They need to know if you have any of these conditions:  bone problems  diabetes  eye disease, vision problems  heart disease  high blood pressure  history of irregular heartbeat  immune system problems  infection  liver disease  pheochromocytoma  seizures  thyroid disease  an unusual or allergic reaction to budesonide, formoterol, other medicines, foods, dyes, or preservatives  pregnant or trying to get pregnant  breast-feeding How should I use this medicine? This medicine is inhaled through the mouth. Rinse your mouth with water after use. Make sure not to swallow the water. Follow the directions on your prescription label. Do not use more often than directed. Do not stop taking except on your doctor's advice. Make sure that you are using your inhaler correctly. Ask your doctor or health care provider if you have any questions. A special MedGuide will be given to you by the pharmacist with each prescription and refill. Be sure to read this information carefully each time. Talk to your pediatrician regarding the use of this medicine in children. While this drug may be prescribed for children as young as 53 years of age for selected conditions, precautions do apply. Overdosage: If you think you have taken too much of this medicine contact a poison  control center or emergency room at once. NOTE: This medicine is only for you. Do not share this medicine with others. What if I miss a dose? If you miss a dose, use it as soon as you can. If it is almost time for your next dose, use only that dose. Do not use double or extra doses. What may interact with this medicine? Do not take the medicine with any of the following medications:  cisapride  dofetilide  dronedarone  MAOIs like Marplan, Nardil, and Parnate  other medicines that contain long-acting beta-2 agonists (LABAs) like arfomoterol, formoterol, indacaterol, olodaterol, salmeterol, vilanterol  pimozide  procarbazine  thioridazine This medicine may also interact with the following medications:  certain antibiotics like clarithromycin, telithromycin  certain antivirals for HIV or hepatitis  certain heart medicines like atenolol, metoprolol  certain medicines for blood pressure, heart disease, irregular heartbeat  certain medicines for depression, anxiety, or psychotic disturbances  certain medicines for fungal infections like ketoconazole, itraconazole  diuretics  grapefruit juice  mifepristone  other medicines that prolong the QT interval (an abnormal heart rhythm)  some vaccines  steroid medicines like prednisone or cortisone  stimulant medicines for attention disorders, weight loss, or to stay awake  theophylline This list may not describe all possible interactions. Give your health care provider a list of all the medicines, herbs, non-prescription drugs, or dietary supplements you use. Also tell them if you smoke, drink alcohol, or use illegal drugs. Some items may interact with your medicine. What should I watch for while using this medicine? Visit your health care professional for regular checks on your progress.  Tell your health care professional if your symptoms do not start to get better or if they get worse. If your symptoms get worse or if you  need your short-acting inhalers more often, call your doctor right away. This medicine may increase your risk of getting an infection. Tell your doctor or health care professional if you are around anyone with measles or chickenpox, or if you develop sores or blisters that do not heal properly. What side effects may I notice from receiving this medicine? Side effects that you should report to your doctor or health care professional as soon as possible:  allergic reactions like skin rash, itching or hives, swelling of the face, lips, or tongue  anxious  breathing problems  changes in vision, eye pain  muscle cramps or muscle pain  signs and symptoms of a dangerous change in heartbeat or heart rhythm like chest pain; dizziness; fast or irregular heartbeat; palpitations; feeling faint or lightheaded, falls; breathing problems  signs and symptoms of high blood sugar such as being more thirsty or hungry or having to urinate more than normal. You may also feel very tired or have blurry vision  signs and symptoms of infection like fever; chills; cough; sore throat; pain or trouble passing urine  tremors  unusually weak or tired  white patches in the mouth or mouth sores Side effects that usually do not require medical attention (report these to your doctor or health care professional if they continue or are bothersome):  back pain  changes in taste  cough  diarrhea  runny or stuffy nose  upset stomach This list may not describe all possible side effects. Call your doctor for medical advice about side effects. You may report side effects to FDA at 1-800-FDA-1088. Where should I keep my medicine? Keep out of the reach of children. Store in a dry place at room temperature between 20 and 25 degrees C (68 and 77 degrees F). Do not get the inhaler wet. Keep track of the number of doses used. Throw away the inhaler after using the marked number of inhalations or after the expiration date,  whichever comes first. Do not burn or puncture canister. NOTE: This sheet is a summary. It may not cover all possible information. If you have questions about this medicine, talk to your doctor, pharmacist, or health care provider.  2020 Elsevier/Gold Standard (2019-03-07 15:50:03)

## 2020-06-12 NOTE — Assessment & Plan Note (Signed)
Albuterol prn. Controller meds started - Symbicort.

## 2020-06-22 ENCOUNTER — Encounter: Payer: Self-pay | Admitting: Pharmacist

## 2020-06-22 ENCOUNTER — Ambulatory Visit: Payer: 59 | Admitting: Pharmacist

## 2020-06-22 ENCOUNTER — Other Ambulatory Visit: Payer: Self-pay

## 2020-06-22 DIAGNOSIS — Z720 Tobacco use: Secondary | ICD-10-CM | POA: Diagnosis not present

## 2020-06-22 DIAGNOSIS — J4520 Mild intermittent asthma, uncomplicated: Secondary | ICD-10-CM | POA: Diagnosis not present

## 2020-06-22 MED ORDER — MONTELUKAST SODIUM 10 MG PO TABS: 10.0000 mg | ORAL_TABLET | Freq: Every day | ORAL | 2 refills | Status: DC

## 2020-06-22 NOTE — Assessment & Plan Note (Signed)
Chronic intermittent asthma continuing to have respiratory congestion and intermittent dyspnea. Unwilling to use inhaled steroid products due to chest pain in the past with Flovent, and desire to avoid trying many different inhaled products  - Patient provided with and educated on use of peak flow meter.  - Discussed using montelukast (singulair) 10 mg daily  - Using a nasal saline spray 2 sprays in each nostril PRN for congestion - Using an allergen-free pillowcase/pillow to mitigate congestion in the mornings/ eliminating dust and other triggers in the home.  - Counseled patient on importance of hydration to keep mucus thin.  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  - Patient has a scheduled CT chest scheduled for next week to assess pulmonary fibrosis/sarcoidosis.

## 2020-06-22 NOTE — Progress Notes (Signed)
S:    Patient arrives in good spirits. Presents for lung function evaluation.   Patient was referred and last seen by Primary Care Provider, Dr. Gwendlyn Deutscher on 06/12/2020.   Patient reports a lot of congestion following the PFT visit at last pharmacist clinic visit which required a PCP visit. Pt states that mucus he coughs up looks like "coffee"- foamy white with brown dots. Sometimes looks yellowish/green first thing in the morning. Otherwise pt reports breathing has been about the same over the past 6 months.   Endorses reduced smoking frequency. Last cigarette smoked in 2013. Endorses smoking marijuana/CBD 3 times/day. 3 months ago the patient was using marijuana/CBD 4-5 times daily. Endorses using marijuana/CBD for sleep, depression, anxiety, and appetite stimulation.   Patient reports patient has not yet picked up Symbicort - was prescribed as 160-4.5 mcg/act 2 puffs BID Patient reports last dose of albuterol was yesterday.  Current asthma medications: Not taking Symbicort - albuterol PRN  Rescue inhaler use frequency: using 2 puffs of albuterol daily (10-11 am), will use albuterol at school to go up stairs.  Level of asthma sx control- in the last 4 weeks: Question Scoring Patient Score  Daytime sx more than twice a week Yes (1) No (0) 1  Any nighttime waking due to asthma Yes (1) No (0) 0  Reliever needed >2x/week Yes (1) No (0) 1  Any activity limitation due to asthma Yes (1) No (0) 1   Total Score   Well controlled - 0, partly controlled - 1-2, uncontrolled 3-4  Peak Flow: Pt reports blowing 650 on the peak flow meter prior to this visit when he was feeling un-congested.   O: Physical Exam Constitutional:      Appearance: Normal appearance. He is normal weight.  HENT:     Nose: Congestion present.  Neurological:     Mental Status: He is alert.    Review of Systems  HENT: Positive for congestion.   Respiratory: Positive for cough, sputum production and shortness of  breath.   Cardiovascular: Positive for chest pain.  Psychiatric/Behavioral: Positive for substance abuse.  All other systems reviewed and are negative.   Vitals:   06/22/20 0928  BP: 126/74  Pulse: (!) 53  SpO2: 98%     A/P: Chronic intermittent asthma continuing to have respiratory congestion and intermittent dyspnea. Unwilling to use inhaled steroid products due to chest pain in the past with Flovent, and desire to avoid trying many different inhaled products  - Patient provided with and educated on use of peak flow meter.  - Discussed using montelukast (singulair) 10 mg daily  - Using a nasal saline spray 2 sprays in each nostril PRN for congestion - Using an allergen-free pillowcase/pillow to mitigate congestion in the mornings/ eliminating dust and other triggers in the home.  - Counseled patient on importance of hydration to keep mucus thin.  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  - Patient has a scheduled CT chest scheduled for next week to assess pulmonary fibrosis/sarcoidosis.   Chronic tobacco (cigars), marijuana abuse:  - Discussed reducing smoking frequency of both tobacco and marijuana/CBD products. Counseled patients on risk of all forms of inhaled smoke products, including e-cigarettes.   Patient verbalized understanding of results and education.  Written pt instructions provided.   F/U via phone call in 3 weeks.   Total time in face to face counseling 30 minutes.  Patient seen with Adria Dill, PharmD Candidate, and Dimple Nanas, PharmD -  PGY-1 Resident, [facility] Better, PharmD.

## 2020-06-22 NOTE — Patient Instructions (Addendum)
It was great to see you today!   Today we discussed the following:  - Starting a drug called montelukast (singulair) 10 mg daily-you can crush this medication by putting in a spoon and smashing with your fingers  - Using a nasal saline spray 2 sprays in each nostril for congestion; you can use this as many times per day as you would like to break up mucous and wash away allergens (this can be purchased over the counter in the pharmacy  - Using an allergen-free pillowcase/pillow to mitigate congestion in the mornings.  - Eliminating dust in the home as much as possible.

## 2020-06-22 NOTE — Assessment & Plan Note (Signed)
Chronic tobacco (cigars), marijuana abuse:  - Discussed reducing smoking frequency of both tobacco and marijuana/CBD products. Counseled patients on risk of all forms of inhaled smoke products, including e-cigarettes.

## 2020-06-25 NOTE — Progress Notes (Signed)
Reviewed: I agree with Dr. Koval's documentation and management. 

## 2020-06-26 ENCOUNTER — Other Ambulatory Visit: Payer: Self-pay

## 2020-06-26 ENCOUNTER — Ambulatory Visit (HOSPITAL_COMMUNITY)
Admission: RE | Admit: 2020-06-26 | Discharge: 2020-06-26 | Disposition: A | Discharge status: Home / Self Care | Payer: 59 | Source: Ambulatory Visit | Attending: Family Medicine | Admitting: Family Medicine

## 2020-06-26 DIAGNOSIS — R0602 Shortness of breath: Secondary | ICD-10-CM | POA: Diagnosis present

## 2020-06-26 DIAGNOSIS — R053 Chronic cough: Secondary | ICD-10-CM | POA: Insufficient documentation

## 2020-06-27 ENCOUNTER — Telehealth: Payer: Self-pay | Admitting: Family Medicine

## 2020-06-27 NOTE — Telephone Encounter (Signed)
CT chest result discussed.  Advised to use inhaler for Asthma as instructed.  Pul referral if no improvement  He agreed with the plan.

## 2020-07-01 ENCOUNTER — Other Ambulatory Visit: Payer: Self-pay

## 2020-07-01 ENCOUNTER — Encounter: Payer: Self-pay | Admitting: Emergency Medicine

## 2020-07-01 ENCOUNTER — Ambulatory Visit
Admission: EM | Admit: 2020-07-01 | Discharge: 2020-07-01 | Disposition: A | Discharge status: Home / Self Care | Payer: 59

## 2020-07-01 DIAGNOSIS — K921 Melena: Secondary | ICD-10-CM | POA: Diagnosis not present

## 2020-07-01 DIAGNOSIS — Z86010 Personal history of colonic polyps: Secondary | ICD-10-CM | POA: Diagnosis not present

## 2020-07-01 NOTE — ED Triage Notes (Signed)
Pt sts blood noted in BM this am; pt sts episode x 1 of same years ago had colonoscopy and polyp removed

## 2020-07-01 NOTE — Discharge Instructions (Addendum)
Sitz bath as needed for additional pain relief: For few inches of warm, plain water and sit. Avoid Epson salt as this can burn. Use rectal suppositories as directed. Very important follow-up with surgery for further evaluation and management. Go to ER for severe pain, bleeding, fever.

## 2020-07-01 NOTE — ED Provider Notes (Signed)
[facility]    CSN: [id number] Arrival date & time: 07/01/20  0813      History   Chief Complaint Chief Complaint  Patient presents with  . Rectal Bleeding    HPI Michael Rogers is a 34 y.o. male  With history as below presenting for bright red blood in stool this morning.  Denying rectal pain, pain with defecation, constipation, diarrhea, testicle or penile pain, discharge, dysuria.  No fever.  Endorsing history of polyps: Concern for this.  Has had colonoscopy - 06/24/2017: Please see records, reviewed by me.  Past Medical History:  Diagnosis Date  . Allergy   . Asthma   . Chest pain   . H. pylori infection   . Prostatitis 05/29/2017  . Puncture wound of right foot 02/28/2016    Patient Active Problem List   Diagnosis Date Noted  . Chronic cough 06/07/2020  . History of illicit drug use 89/38/1017  . Dyspnea   . Bradycardia 09/21/2018  . Tobacco abuse 02/20/2012  . Mild intermittent asthma without complication 51/09/5850  . H. pylori infection 01/21/2012    Past Surgical History:  Procedure Laterality Date  . COLONOSCOPY  2018  . MOUTH SURGERY    . ROOT CANAL         Home Medications    Prior to Admission medications   Medication Sig Start Date End Date Taking? Authorizing Provider  albuterol (VENTOLIN HFA) 108 (90 Base) MCG/ACT inhaler Inhale 2 puffs into the lungs every 6 (six) hours as needed for wheezing or shortness of breath. 05/22/20   [name], MD  budesonide-formoterol (SYMBICORT) 160-4.5 MCG/ACT inhaler Inhale 2 puffs into the lungs in the morning and at bedtime. Patient not taking: Reported on 06/22/2020 06/12/20   [name], MD  cetirizine HCl (ZYRTEC) 5 MG/5ML SOLN Take 10 mLs (10 mg total) by mouth daily. Patient not taking: Reported on 06/07/2020 05/22/20   [name], MD  ELDERBERRY PO Take by mouth.    [provider]  montelukast (SINGULAIR) 10 MG tablet Take 1 tablet (10 mg total) by  mouth at bedtime. 06/22/20   Leavy Cella, RPH-CPP    Family History Family History  Problem Relation Age of Onset  . Kidney disease Father   . Hypertension Paternal Grandmother   . Heart disease Paternal Uncle   . Diabetes Maternal Aunt   . Colon cancer Neg Hx   . Stomach cancer Neg Hx     Social History Social History   Tobacco Use  . Smoking status: Former Smoker    Packs/day: 0.50    Years: 10.00    Pack years: 5.00    Types: Cigarettes    Start date: 03/17/2013    Quit date: 09/28/2013    Years since quitting: 6.7  . Smokeless tobacco: Never Used  Vaping Use  . Vaping Use: Never used  Substance Use Topics  . Alcohol use: Yes    Comment: rarely   . Drug use: Yes    Types: Marijuana    Comment: quit 2020     Allergies   Dust mite extract, Other, and Pork-derived products   Review of Systems Review of Systems  Constitutional: Negative for fatigue and fever.  Respiratory: Negative for cough and shortness of breath.   Cardiovascular: Negative for chest pain and palpitations.  Gastrointestinal: Positive for blood in stool. Negative for abdominal distention, abdominal pain, anal bleeding, constipation, diarrhea, nausea, rectal pain and vomiting.  Musculoskeletal: Negative for  arthralgias and myalgias.  Skin: Negative for rash and wound.  Neurological: Negative for speech difficulty and headaches.  All other systems reviewed and are negative.    Physical Exam Triage Vital Signs ED Triage Vitals  Enc Vitals Group     BP      Pulse      Resp      Temp      Temp src      SpO2      Weight      Height      Head Circumference      Peak Flow      Pain Score      Pain Loc      Pain Edu?      Excl. in [place]?    No data found.  Updated Vital Signs BP (!) 138/91 (BP Location: Left Arm)   Pulse 66   Temp 98.9 F (37.2 C) (Oral)   Resp 18   SpO2 98%   Visual Acuity Right Eye Distance:   Left Eye Distance:   Bilateral Distance:    Right Eye Near:     Left Eye Near:    Bilateral Near:     Physical Exam Constitutional:      General: He is not in acute distress. HENT:     Head: Normocephalic and atraumatic.  Eyes:     General: No scleral icterus.    Pupils: Pupils are equal, round, and reactive to light.  Cardiovascular:     Rate and Rhythm: Normal rate.  Pulmonary:     Effort: Pulmonary effort is normal. No respiratory distress.     Breath sounds: No wheezing.  Abdominal:     Palpations: Abdomen is soft.     Tenderness: There is no abdominal tenderness.  Genitourinary:    Comments: Pt declined Skin:    Coloration: Skin is not jaundiced or pale.  Neurological:     Mental Status: He is alert and oriented to person, place, and time.      UC Treatments / Results  Labs (all labs ordered are listed, but only abnormal results are displayed) Labs Reviewed - No data to display  EKG   Radiology No results found.  Procedures Procedures (including critical care time)  Medications Ordered in UC Medications - No data to display  Initial Impression / Assessment and Plan / UC Course  I have reviewed the triage vital signs and the nursing notes.  Pertinent labs & imaging results that were available during my care of the patient were reviewed by me and considered in my medical decision making (see chart for details).     H&P concerning for colonic polyps given history.  Discussed possibility of hemorrhoids.  Less concerning for prostatitis.  Reviewed supportive care as below, will follow up with his GI doctor for colonoscopy.  Return precautions discussed, pt verbalized understanding and is agreeable to plan. Final Clinical Impressions(s) / UC Diagnoses   Final diagnoses:  Blood in stool, frank  History of colonic polyps     Discharge Instructions     Sitz bath as needed for additional pain relief: For few inches of warm, plain water and sit. Avoid Epson salt as this can burn. Use rectal suppositories as  directed. Very important follow-up with surgery for further evaluation and management. Go to ER for severe pain, bleeding, fever.    ED Prescriptions    None     PDMP not reviewed this encounter.   Hall[place]Potvin, [place], [place] 07/01/20  0905  

## 2020-07-02 ENCOUNTER — Telehealth: Payer: Self-pay

## 2020-07-02 ENCOUNTER — Other Ambulatory Visit: Payer: Self-pay | Admitting: Family Medicine

## 2020-07-02 DIAGNOSIS — R109 Unspecified abdominal pain: Secondary | ICD-10-CM

## 2020-07-02 DIAGNOSIS — K625 Hemorrhage of anus and rectum: Secondary | ICD-10-CM

## 2020-07-02 NOTE — Telephone Encounter (Signed)
Please advised, referral was placed since I have treated him in the past for abdominal pain. I also reviewed his recent ED report for rectal bleed.

## 2020-07-02 NOTE — Telephone Encounter (Signed)
Patient calls nurse line requesting new referral for [facility]. Patient was seen in the UC yesterday for bloody stools. Patient is requesting to return to specialist for further work up. Patient states that he continues to have flare ups with abdominal pain., groin pain and rectal pain.   Please advise if referral can be placed for patient.   Talbot Grumbling, RN

## 2020-07-04 ENCOUNTER — Ambulatory Visit: Payer: 59 | Admitting: Cardiology

## 2020-07-10 ENCOUNTER — Telehealth: Payer: Self-pay | Admitting: Pharmacist

## 2020-07-10 NOTE — Telephone Encounter (Signed)
Contacted patient to assess breathing and medication use/impact.   Patient states his breathing is "about the same".  He continues to use his inhalers but admits that he did not pick-up or try the montelukast (Singulair).  Following some discuss of potential benefit, the patient remained contemplative about trying something else despite have intermittent moderate-severe dyspnea episodes.   I asked about his plans to return to Dr. Gwendlyn Deutscher and he said that he would do so after he has a gastroenterologist work-up for recent rectal bleeding.    I do not plan additional follow-up at this time.

## 2020-07-10 NOTE — Telephone Encounter (Signed)
Noted and agree. 

## 2020-08-15 ENCOUNTER — Ambulatory Visit (INDEPENDENT_AMBULATORY_CARE_PROVIDER_SITE_OTHER): Payer: Self-pay | Admitting: Family Medicine

## 2020-08-15 ENCOUNTER — Ambulatory Visit: Payer: 59 | Admitting: Family Medicine

## 2020-08-15 ENCOUNTER — Encounter: Payer: Self-pay | Admitting: Family Medicine

## 2020-08-15 ENCOUNTER — Other Ambulatory Visit: Payer: Self-pay

## 2020-08-15 ENCOUNTER — Telehealth: Payer: Self-pay

## 2020-08-15 DIAGNOSIS — R053 Chronic cough: Secondary | ICD-10-CM

## 2020-08-15 NOTE — Telephone Encounter (Signed)
Patient calls nurse line regarding chronic cough since September. Patient reports having multiple COVID tests that have all came back negative, with most recent negative result last week. (Patient has documentation that he can bring to visit today). Patient is concerned due to productive nature of cough. States that he is coughing up "golden, jelly-like" phlegm.    Spoke with precepter Owens Shark) regarding patient. Advised that patient can be scheduled in clinic with Dr. Tarry Kos this morning.   Talbot Grumbling, RN

## 2020-08-15 NOTE — Assessment & Plan Note (Signed)
Chronic, persistent. Noncompliant with recommended treatments with inadequate trial. No infectious symptome. No on ACE-I. Normal echo in March 202 thus unlikely CHF. Differentials include postnasal drip, asthma, GERD, irritant inhalation. Less likely pulmonary disease (tumor, ILD, sarcoidosis, etc) given normal CXR and CT chest. Normal ear exam.  - Recommended restarting symbicort as prescribed - Daily Claritin and Flonase - Mucinex as needed - Strongly encouraged to stop inhalation products such as CBD, marijuana, and tobacco - trial OTC tums, gaviscon, pepcid for reflux as needed - IF persists despite adequate trial of above, consider referral to pulm and/or allergist for further evaluatoin

## 2020-08-15 NOTE — Progress Notes (Signed)
   Subjective:   Patient ID: Michael Rogers    DOB: Sep 27, 1985, 35 y.o. male   MRN: [id number]  Michael Rogers is a 35 y.o. male with a history of mild intermittent asthma, bradycardia, chronic cough, dyspnea, history of H. pylori infection, history of illicit drug use, tobacco abuse here for chronic cough  Chronic cough: Patient presenting today with persistent cough since September. He notes the cough comes in goes, will last 1-2 weeks then he'll have resolution of cough for 1-2 weeks.  He was seen by his PCP in November due to similar concern.  Describes the cough as productive that is green/yellow, has sometimes been dark. He reports one episode of gold mucus. He does feel like he has postnasal drip. Felt to be possibly related to asthma versus GERD.  He was started on Symbicort twice daily plus albuterol as needed.  He had CXR in 05/2020 taht was normal. He had CT chest completed in November 2021 nd was otherwise normal. He has not been using the inhalers as recommended.  He has a history of tobacco use. Hasn't smoked tobacco since 2013. Smokes marijuana occasionally but mostly smokes CBD flower and occasionally takes gummies.  Denies any infectious symptoms. COVID test negative last week.   Meds: Albuterol, Symbicort, Zyrtec, elderberry, Singulair - DENIES USE OF ANY OF THESE  Illicit drug use: occasional marijuana.   Review of Systems:  Per HPI.   Objective:   BP 130/70   Pulse 87   Temp 98.9 F (37.2 C) (Oral)   Ht 5\' 9"  (1.753 m)   Wt 197 lb (89.4 kg)   SpO2 97%   BMI 29.09 kg/m  Vitals and nursing note reviewed.  General: pleasant middle aged man, sitting comfortably in exam chair, well nourished, well developed, in no acute distress with non-toxic appearance HEENT: normocephalic, atraumatic, moist mucous membranes, oropharynx clear with mild cobblestoning, TM normal bilaterally  Lungs: clear to auscultation bilaterally with normal work of breathing on room air, speaking  in full sentences Skin: warm, dry MSK:  gait normal Neuro: Alert and oriented, speech normal  Assessment & Plan:   Chronic cough Chronic, persistent. Noncompliant with recommended treatments with inadequate trial. No infectious symptome. No on ACE-I. Normal echo in March 202 thus unlikely CHF. Differentials include postnasal drip, asthma, GERD, irritant inhalation. Less likely pulmonary disease (tumor, ILD, sarcoidosis, etc) given normal CXR and CT chest. Normal ear exam.  - Recommended restarting symbicort as prescribed - Daily Claritin and Flonase - Mucinex as needed - Strongly encouraged to stop inhalation products such as CBD, marijuana, and tobacco - trial OTC tums, gaviscon, pepcid for reflux as needed - IF persists despite adequate trial of above, consider referral to pulm and/or allergist for further evaluatoin  No orders of the defined types were placed in this encounter.  No orders of the defined types were placed in this encounter.  Mina Marble, DO PGY-3, [facility] 08/15/2020 11:26 AM

## 2020-08-15 NOTE — Patient Instructions (Addendum)
It was a pleasure to see you today!  Thank you for choosing [facility] for your primary care.   Our plans for today were:  Please restart your Symbicort daily  Use the albuterol AS NEEDED  Please take Clairitin daily and Flonase  Use Mucinex to help with the postnasal drip  If you feel you are having acid reflux, you can try Pepcid, Gaviscon, Tums  I strongly urge you to discontinue any type of smoking as this is likely contributing  Please return if no improvement once you have tried these things above.   Best Wishes,   Mina Marble, DO

## 2020-09-12 ENCOUNTER — Other Ambulatory Visit: Payer: Self-pay

## 2020-09-12 ENCOUNTER — Ambulatory Visit (INDEPENDENT_AMBULATORY_CARE_PROVIDER_SITE_OTHER): Payer: Self-pay | Admitting: Gastroenterology

## 2020-09-12 ENCOUNTER — Encounter: Payer: Self-pay | Admitting: Gastroenterology

## 2020-09-12 VITALS — BP 132/90 | HR 64 | Ht 69.0 in | Wt 199.6 lb

## 2020-09-12 DIAGNOSIS — K602 Anal fissure, unspecified: Secondary | ICD-10-CM

## 2020-09-12 MED ORDER — DILTIAZEM GEL 2 %: CUTANEOUS | 0 refills | Status: DC

## 2020-09-12 NOTE — Progress Notes (Signed)
Review of pertinent gastrointestinal problems: 1.  Routine risk for colon cancer.  Colonoscopy November 2018 for bowel habit changes, rectal bleeding, 7 mm polyp was removed, this was not precancerous on pathology.  The examination was otherwise normal.   HPI: This is a very pleasant 35 year old man whom I last saw a little over 3 years ago.  He is here today for a new problem   I saw him 3 and half years ago for an office visit followed by a colonoscopy.  In the office he explained me that he had had previous H. pylori and was treated with antibiotics for it.  He had recently been diagnosed with prostatitis and did not complete the medicines for it, I strongly recommended that he contact his urologist about his prostatitis.  He had seen some overt red rectal bleeding and was having some loose stools and because of that I recommended a colonoscopy.  See those results summarized above   He has had painful stabbing anal pain about twice a week for the past 2 months.  He sees a minor amount of bright red blood per rectum with just about every BM as well.  He is not struggling to move his bowels, he really never has to push or strain.  His weight is overall stable in a 10 pound range.  Colon cancer does not run in his family.  He was using 2 different types of cream for his bottom when the pain started, 1 sounds like it is Desitin, the other is an antibiotic ointment.   Review of systems: Pertinent positive and negative review of systems were noted in the above HPI section. All other review negative.   Past Medical History:  Diagnosis Date  . Allergy   . Asthma   . Chest pain   . H. pylori infection   . Prostatitis 05/29/2017  . Puncture wound of right foot 02/28/2016    Past Surgical History:  Procedure Laterality Date  . COLONOSCOPY  2018  . MOUTH SURGERY    . ROOT CANAL      Current Outpatient Medications  Medication Sig Dispense Refill  . ELDERBERRY PO Take by mouth.    Marland Kitchen  albuterol (VENTOLIN HFA) 108 (90 Base) MCG/ACT inhaler Inhale 2 puffs into the lungs every 6 (six) hours as needed for wheezing or shortness of breath. (Patient not taking: Reported on 09/12/2020) 8 g 2  . budesonide-formoterol (SYMBICORT) 160-4.5 MCG/ACT inhaler Inhale 2 puffs into the lungs in the morning and at bedtime. (Patient not taking: Reported on 09/12/2020) 1 each 12  . cetirizine HCl (ZYRTEC) 5 MG/5ML SOLN Take 10 mLs (10 mg total) by mouth daily. (Patient not taking: Reported on 06/07/2020) 300 mL 2  . montelukast (SINGULAIR) 10 MG tablet Take 1 tablet (10 mg total) by mouth at bedtime. (Patient not taking: No sig reported) 30 tablet 2   No current facility-administered medications for this visit.    Allergies as of 09/12/2020 - Review Complete 09/12/2020  Allergen Reaction Noted  . Dust mite extract Itching 04/18/2014  . Other Itching and Other (See Comments) 01/21/2014  . Pork-derived products Nausea Only 06/24/2017    Family History  Problem Relation Age of Onset  . Kidney disease Father   . Hypertension Paternal Grandmother   . Heart disease Paternal Uncle   . Diabetes Maternal Aunt   . Colon cancer Neg Hx   . Stomach cancer Neg Hx     Social History   Socioeconomic History  .  Marital status: Unknown    Spouse name: Not on file  . Number of children: 1  . Years of education: Not on file  . Highest education level: Not on file  Occupational History  . 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 Smoking status: Former Smoker    Packs/day: 0.50    Years: 10.00    Pack years: 5.00    Types: Cigarettes    Start date: 03/17/2013    Quit date: 09/28/2013    Years since quitting: 6.9  . Smokeless tobacco: Never Used  Vaping Use  . Vaping Use: Never used  Substance and Sexual Activity  . Alcohol use: Yes    Comment: rarely   . Drug use: Yes    Types: Marijuana    Comment: quit 2020  . Sexual activity: Yes    Partners: Female    Birth control/protection: None  Other  Topics Concern  . Not on file  Social History Narrative   Lives with mother.     History of incarceration in 2012.   Social Determinants of Health   Financial Resource Strain: Not on file  Food Insecurity: Not on file  Transportation Needs: Not on file  Physical Activity: Not on file  Stress: Not on file  Social Connections: Not on file  Intimate Partner Violence: Not on file     Physical Exam: BP 132/90   Pulse 64   Ht 5\' 9"  (1.753 m)   Wt 199 lb 9.6 oz (90.5 kg)   BMI 29.48 kg/m  Constitutional: generally well-appearing Psychiatric: alert and oriented x3 Eyes: extraocular movements intact Mouth: oral pharynx moist, no lesions Neck: supple no lymphadenopathy Cardiovascular: heart regular rate and rhythm Lungs: clear to auscultation bilaterally Abdomen: soft, nontender, nondistended, no obvious ascites, no peritoneal signs, normal bowel sounds Extremities: no lower extremity edema bilaterally Skin: no lesions on visible extremities Rectal examination: Small midline posterior fairly superficial anal fissure, no external hemorrhoids, no masses on digital rectal exam, stool is brown and not checked for Hemoccult  Assessment and plan: 35 y.o. male with anal fissure  First I asked that he stop the Desitin and absolutely stop putting antibiotic ointment on his anus.  Instead we are giving him a prescription for diltiazem ointment gel 2% to apply 3 times a day.  RectiCare samples were given, he will try sitz bath's twice daily.  He is also going to start fiber supplements with Citrucel on a daily basis.  He will call to report on his response in 6 to 8 weeks, certainly sooner if things get worse and not better.  Please see the "Patient Instructions" section for addition details about the plan.   Owens Loffler, MD [facility] 09/12/2020, 10:28 AM  Cc: [name], MD  Total time on date of encounter was 45 minutes (this included time spent preparing to see  the patient reviewing records; obtaining and/or reviewing separately obtained history; performing a medically appropriate exam and/or evaluation; counseling and educating the patient and family if present; ordering medications, tests or procedures if applicable; and documenting clinical information in the health record).

## 2020-09-12 NOTE — Patient Instructions (Addendum)
If you are age 34 or older, your body mass index should be between 23-30. Your Body mass index is 29.48 kg/m. If this is out of the aforementioned range listed, please consider follow up with your Primary Care Provider.  Diltiazem Gel ointment 2%, apply three times daily every day and then for 1 month after your pain is gone. Up to first knuckle.  We have sent a prescription for Diltiazem ointment 2%  to [facility].   [facility]'s information is below: Address: [address], [place], [place] [address]  [phone]  *Please DO NOT go directly from our office to pick up this medication! Give the pharmacy 1 day to process the prescription as this is compounded and takes time to make.  Recticare samples given: apply 2-3 times daily.  Sitz baths twice daily.  Please start taking citrucel (orange flavored) powder fiber supplement.  This may cause some bloating at first but that usually goes away. Begin with a small spoonful and work your way up to a large, heaping spoonful daily over a week.  Call to report on your progress in 6-8 weeks.  Thank you for entrusting me with your care and choosing [facility].  Dr Ardis Hughs

## 2021-01-08 ENCOUNTER — Other Ambulatory Visit: Payer: Self-pay

## 2021-01-08 ENCOUNTER — Ambulatory Visit
Admission: RE | Admit: 2021-01-08 | Discharge: 2021-01-08 | Disposition: A | Discharge status: Home / Self Care | Payer: 59 | Source: Ambulatory Visit | Attending: Family Medicine | Admitting: Family Medicine

## 2021-01-08 ENCOUNTER — Ambulatory Visit (INDEPENDENT_AMBULATORY_CARE_PROVIDER_SITE_OTHER): Payer: 59 | Admitting: Student in an Organized Health Care Education/Training Program

## 2021-01-08 VITALS — BP 130/80 | HR 62 | Ht 69.0 in | Wt 194.6 lb

## 2021-01-08 DIAGNOSIS — M5410 Radiculopathy, site unspecified: Secondary | ICD-10-CM | POA: Diagnosis not present

## 2021-01-08 DIAGNOSIS — M542 Cervicalgia: Secondary | ICD-10-CM

## 2021-01-08 MED ORDER — PREDNISONE 10 MG (21) PO TBPK: ORAL_TABLET | ORAL | 0 refills | Status: DC

## 2021-01-08 MED ORDER — TRAMADOL HCL 50 MG PO TABS
50.0000 mg | ORAL_TABLET | Freq: Three times a day (TID) | ORAL | 0 refills | Status: AC | PRN
Start: 2021-01-08 — End: 2021-01-11

## 2021-01-08 NOTE — Progress Notes (Signed)
   SUBJECTIVE:   CHIEF COMPLAINT / HPI: pulled muscle in neck?  Acute onset right neck pain-  Woke up with a small crook in his right neck but improved with movement this morning. But at work, it got worse.  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  He came in to see the doctor today because he was unable to complete his work secondary to pain.  Describes the pain as sharp stabbing pain starting at the base of his right neck and shoots up to his head as well as right shoulder pain. Decreased rotation, extension, flexion, sidebending of his neck. Feels like something is pulling or tight.  Can't roll his shoulder on right.  He is able to roll his left shoulder. No numbness at the moment but he does have it periodically throughout the day with certain positions affecting his shoulder and lateral upper arm.  Has the sudden onset pain with cough or deep breaths.  left handed and a Curator. All his pain is on his right side.  No recent injury or heavy lifting.  Yesterday he felt completely fine. Has not tried anything other than stretching for the pain yet.  OBJECTIVE:   BP 130/80   Pulse 62   Ht 5\' 9"  (1.753 m)   Wt 194 lb 9.6 oz (88.3 kg)   SpO2 99%   BMI 28.74 kg/m   Physical Exam Constitutional:      General: He is in acute distress.     Appearance: He is not ill-appearing or toxic-appearing.  Cardiovascular:     Rate and Rhythm: Normal rate and regular rhythm.  Pulmonary:     Effort: Pulmonary effort is normal.     Breath sounds: Normal breath sounds.  Musculoskeletal:     Cervical back: Rigidity and tenderness present.  Skin:    General: Skin is warm and dry.     Capillary Refill: Capillary refill takes less than 2 seconds.  Neurological:     Mental Status: He is alert and oriented to person, place, and time.     Cranial Nerves: No cranial nerve deficit.     Comments: Due to pain, unable to fully assess strength and range of motion of right shoulder.   Acutely positive for Spurling test on right.     ASSESSMENT/PLAN:   Neck pain, acute Given the shoulder numbness and positional nature of the pain, suspect that patient may have radiculopathy. Another thing to consider on the differential is torticollis. Ordering C-spine x-ray. Short prescription of tramadol for pain as well as a steroid pack for inflammation. Recommended patient rest and attempt gentle stretching of the area for several days until he sees improvement and then can consider rehab exercises. Follow-up with PCP in 1 week     [place]

## 2021-01-08 NOTE — Patient Instructions (Signed)
It was a pleasure to see you today!  To summarize our discussion for this visit:  Today we are going to get an xray of your neck and start treatment assuming you have a pinched nerve in your neck.   Steroid pack  Tramadol as needed for pain, can also use tylenol.  Rest, rest, rest.   Heat and/or ice  Follow up with your PCP in a week to reassess if still having pain.  Some additional health maintenance measures we should update are: Health Maintenance Due  Topic Date Due  . COVID-19 Vaccine (1) Never done  . Pneumococcal Vaccine 18-69 Years old (1 of 2 - PPSV23) Never done  .    Call the clinic at [phone] if your symptoms worsen or you have any concerns.   Thank you for allowing me to take part in your care,  Dr. Doristine Mango   Pinched Nerve A pinched nerve is an injury that occurs when too much pressure is placed on a nerve. This pressure can cause pain, burning, and muscle weakness in places that the nerve supplies feeling to, such as an arm, hand, or leg, or the back or neck. If a nerve is severely pinched or has been pinched for a long time, permanent nerve damage can occur. What are the causes? This condition may be caused by:  A nerve passing through a narrow area between bones or other body structures.  Arthritis that causes bones to press on a nerve.  Loss of blood supply to a nerve.  A nerve being stretched from an injury.  A sudden injury with swelling.  Long-term wear on the nerve.  Age-related changes in the spine. What are the signs or symptoms? The most common symptoms of a pinched nerve are feeling a tingling sensation and numbness. Other symptoms include:  Pain that spreads from one area of the body part to another.  A burning feeling.  Muscle weakness. How is this diagnosed? This condition may be diagnosed based on:  A physical exam. During the exam, your health care provider will: ? Check for numbness and muscle weakness. ? Move  affected body parts to test for pain.  X-rays to check for bone damage.  A MRI or CT scan to check for conditions that may be causing nerve damage.  A muscle test (electromyogram, EMG) to evaluate how your muscles and nerves communicate. How is this treated? A pinched nerve is usually treated first by:  Resting the affected body area.  Using devices to help you move without pain (supportive or protective devices), such as a splint, brace, or neck collar. Other treatments depend on your symptoms and the amount of nerve damage you have. Other treatments may include:  Medicines, such as: ? Injections of numbing medicine. ? NSAIDs. ? Pain medicines. ? Steroid medicines. These may be given as a pill or as an injection.  Physical therapy to relieve pain, maintain movement, and improve muscle strength.  Surgery. This may be done if other treatments do not work.      Follow these instructions at home:  Take over-the-counter and prescription medicines only as told by your health care provider.  Wear supportive or protective devices as told by your health care provider.  Do physical therapy exercises as directed.  Ask your health care provider what activities are safe for you.  Rest as needed.  If directed, put ice on the affected area: ? Put ice in a plastic bag. ? Place a towel between  your skin and the bag. ? Leave the ice on for 20 minutes, 2-3 times a day.  If directed, apply heat to the affected area. Use the heat source that your health care provider recommends, such as a moist heat pack or a heating pad. ? Place a towel between your skin and the heat source. ? Leave the heat on for 20-30 minutes. ? Remove the heat if your skin turns bright red. This is especially important if you are unable to feel pain, heat, or cold. You may have a greater risk of getting burned.  Do not drive or use heavy machinery while taking prescription pain medicine.  Keep all follow-up visits  as told by your health care provider. This is important.      Contact a health care provider if:  Your condition does not improve with treatment.  Your pain, numbness, or weakness suddenly gets worse. Get help right away if you:  Have loss of bladder control (urinary incontinence) or you cannot urinate.  Cannot control bowel movements (fecal incontinence).  Have new weakness in your arms or legs. Summary  A pinched nerve is an injury that occurs when too much pressure is placed on a nerve.  This pressure can cause pain, burning, and muscle weakness in places that the nerve supplies feeling to, such as an arm, hand, or leg, or the back or neck.  Take over-the-counter and prescription medicines only as told by your health care provider.  Ask your health care provider what activities are safe for you while you are having symptoms. This information is not intended to replace advice given to you by your health care provider. Make sure you discuss any questions you have with your health care provider. Document Revised: 08/07/2017 Document Reviewed: 08/04/2017 Elsevier Patient Education  2021 [employer].

## 2021-01-09 DIAGNOSIS — G8929 Other chronic pain: Secondary | ICD-10-CM | POA: Insufficient documentation

## 2021-01-09 NOTE — Assessment & Plan Note (Signed)
Given the shoulder numbness and positional nature of the pain, suspect that patient may have radiculopathy. Another thing to consider on the differential is torticollis. Ordering C-spine x-ray. Short prescription of tramadol for pain as well as a steroid pack for inflammation. Recommended patient rest and attempt gentle stretching of the area for several days until he sees improvement and then can consider rehab exercises. Follow-up with PCP in 1 week

## 2021-02-01 ENCOUNTER — Ambulatory Visit: Payer: 59 | Admitting: Family Medicine

## 2021-02-06 ENCOUNTER — Telehealth: Payer: Self-pay

## 2021-02-06 NOTE — Telephone Encounter (Signed)
Patient calls nurse line reporting dizziness and nausea post covid infection. Patient reports he tested positive for covid on 6/28 and started experiencing symptoms same day. Patient reports he has cough and congestion now, denies fever chills. Patient denies chest pains, SOB, headache, or blurry vision. Patient has a scheduled apt already for 7/8 with PCP. ED precautions given to patient in the meantime and to drink plenty fluids. Is patient ok to come in on 7/8?

## 2021-02-06 NOTE — Telephone Encounter (Signed)
I checked on this patient. He tested positive for COVID19 recently but feeling much better. His repeat test yesterday was negative, but he is still symptomatic. ?? False negativity.  I discussed switching his 7/8 in person appointment to a virtual visit due to recently positive COVID. He agreed with Virtual visit instead.

## 2021-02-08 ENCOUNTER — Telehealth (INDEPENDENT_AMBULATORY_CARE_PROVIDER_SITE_OTHER): Payer: 59 | Admitting: Family Medicine

## 2021-02-08 ENCOUNTER — Encounter: Payer: Self-pay | Admitting: Family Medicine

## 2021-02-08 VITALS — Ht 69.0 in | Wt 194.6 lb

## 2021-02-08 DIAGNOSIS — G514 Facial myokymia: Secondary | ICD-10-CM | POA: Insufficient documentation

## 2021-02-08 DIAGNOSIS — M47812 Spondylosis without myelopathy or radiculopathy, cervical region: Secondary | ICD-10-CM

## 2021-02-08 DIAGNOSIS — G8929 Other chronic pain: Secondary | ICD-10-CM

## 2021-02-08 DIAGNOSIS — U071 COVID-19: Secondary | ICD-10-CM

## 2021-02-08 DIAGNOSIS — M542 Cervicalgia: Secondary | ICD-10-CM

## 2021-02-08 HISTORY — DX: COVID-19: U07.1
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
## 2021-02-08 NOTE — Assessment & Plan Note (Signed)
No test result on file - home testing. He recovered pretty well.

## 2021-02-08 NOTE — Assessment & Plan Note (Signed)
Neck xray reviewed and was discussed with him. It shows reversal of cervical lordosis with degenerative changes C5 through C7. This may be contributing to his pain. Since this is chronic and worsening, I recommended discussion with a spine specialist. She agreed with the plan. Referral placed.

## 2021-02-08 NOTE — Patient Instructions (Signed)
Cervical Sprain A cervical sprain is also called a neck sprain. It is a stretch or tear in one or more ligaments in the neck. Ligaments are tissues that connect bones to eachother. Neck sprains can be mild, bad, or very bad. A very bad sprain in the neck can cause the bones in the neck to be unstable. This can damage the spinal cord. It can also cause serious problems in the brain, spinal cord, and nerves (nervous system). Most neck sprains heal in 4-6 weeks. It can take more or less time depending on: What caused the injury. The amount of injury. What are the causes? Neck sprains may be caused by trauma, such as: An injury from an accident in a vehicle such as a car or boat. A fall. The head and neck being moved front to back or side to side all of a sudden (whiplash injury). Mild neck sprains may be caused by wear and tear over time. What increases the risk? The following factors may make you more likely to develop this condition: Taking part in activities that put you at high risk of hurting your neck. These include: Contact sports. Animator. Gymnastics. Diving. Taking risks when driving or riding in a vehicle such as a car or boat. Arthritis caused by wear and tear of the joints in the spine. The neck not being very strong or flexible. Having had a neck injury in the past. Poor posture. Spending a lot of time in certain positions that put stress on the neck. This may be from sitting at a computer for a long time. What are the signs or symptoms? Symptoms of this condition include: Your neck, shoulders, or upper back feeling: Painful or sore. Stiff. Tender. Swollen. Hot, or like it is burning. Sudden tightening of neck muscles (spasms). Not being able to move the neck very much. Headache. Feeling dizzy. Feeling like you may vomit, or vomiting. Having a hand or arm that: Feels weak. Loses feeling (feels numb). Tingles. You may get symptoms right away after injury, or you  may get them over a few days. In some cases, symptoms may go away with treatment and come back overtime. How is this treated? This condition is treated by: Resting your neck. Icing the part of your neck that is hurt. Doing exercises to restore movement and strength to your neck (physical therapy). If there is no swelling, you may use heat therapy 2-3 days after the injury took place. If your injury is very bad, treatment may also include: Keeping your neck in place for a length of time. This may be done using: A neck collar. This supports your chin and the back of your head. A cervical traction device. This is a sling that holds up your head. The sling removes weight and pressure from your neck. It may also help to relieve pain. Medicines that help with: Pain. Irritation and swelling (inflammation). Medicines that help to relax your muscles (muscle relaxants). Surgery. This is rare. Follow these instructions at home: Medicines  Take over-the-counter and prescription medicines only as told by your doctor. Ask your doctor if the medicine prescribed to you: Requires you to avoid driving or using heavy machinery. Can cause trouble pooping (constipation). You may need to take these actions to prevent or treat trouble pooping: Drink enough fluid to keep your pee (urine) pale yellow. Take over-the-counter or prescription medicines. Eat foods that are high in fiber. These include beans, whole grains, and fresh fruits and vegetables. Limit foods that  are high in fat and sugar. These include fried or sweet foods.  If you have a neck collar: Wear it as told by your doctor. Do not take it off unless told. Ask your doctor before adjusting your collar. If you have long hair, keep it outside of the collar. Ask your doctor if you may take off the collar for cleaning and bathing. If you may take off the collar: Follow instructions about how to take it off safely. Clean it by hand with mild soap and  water. Let it air-dry fully. If your collar has pads that you can take out: Take the pads out every 1-2 days. Wash them by hand with soap and water. Let the pads air-dry fully before you put them back in the collar. Tell your doctor if your skin under the collar has irritation or sores. Managing pain, stiffness, and swelling     Use a cervical traction device, if told by your doctor. If told, put ice on the affected area. To do this: Put ice in a plastic bag. Place a towel between your skin and the bag. Leave the ice on for 20 minutes, 2-3 times a day. If told, put heat on the affected area. Do this before exercise or as often as told by your doctor. Use the heat source that your doctor recommends, such as a moist heat pack or a heating pad. Place a towel between your skin and the heat source. Leave the heat on for 20-30 minutes. Take the heat off if your skin turns bright red. This is very important if you cannot feel pain, heat, or cold. You may have a greater risk of getting burned. Activity Do not drive while wearing a neck collar. If you do not have a neck collar, ask if it is safe to drive while your neck heals. Do not lift anything that is heavier than 10 lb (4.5 kg), or the limit that you are told, until your doctor tells you that it is safe. Rest as told by your doctor. Do exercises as told by your doctor or physical therapist. Return to your normal activities as told by your doctor. Avoid positions and activities that make you feel worse. Ask your doctor what activities are safe for you. General instructions Do not use any products that contain nicotine or tobacco, such as cigarettes, e-cigarettes, and chewing tobacco. These can delay healing. If you need help quitting, ask your doctor. Keep all follow-up visits as told by your doctor or physical therapist. This is important. How is this prevented? To prevent a neck sprain from happening again: Practice good posture. Adjust  your workstation to help you do this. Exercise regularly as told by your doctor or physical therapist. Avoid activities that are risky or may cause a neck sprain. Contact a doctor if: Your symptoms get worse. Your symptoms do not get better after 2 weeks of treatment. Your pain gets worse. Medicine does not help your pain. You have new symptoms that you cannot explain. Your neck collar gives you sores on your skin or bothers your skin. Get help right away if: You have very bad pain. You get any of the following in any part of your body: Loss of feeling. Tingling. Weakness. You cannot move a part of your body. You have neck pain and either of these: Very bad dizziness. A very bad headache. Summary A cervical sprain is also called a neck sprain. It is a stretch or tear in one or more ligaments  in the neck. Ligaments are tissues that connect bones. Neck sprains may be caused by trauma, such as an injury or a fall. You may get symptoms right away after injury, or you may get them over a few days. Neck sprains may be treated with rest, heat, ice, medicines, exercise, and surgery. This information is not intended to replace advice given to you by your health care provider. Make sure you discuss any questions you have with your healthcare provider. Document Revised: 03/30/2019 Document Reviewed: 03/30/2019 Elsevier Patient Education  [employer].

## 2021-02-08 NOTE — Assessment & Plan Note (Signed)
Etiology unknown. ?? Stress vs Recent COVID-19 infection vs facial vessel pulsation vs intracranial pathology. Unable to complete a full neuro exam via video visit. However, I doubt stroke given normal speech and no facial asymmetry. Clinic appointment made. ED if symptoms worsens. He agreed with the plan.

## 2021-02-19 ENCOUNTER — Encounter: Payer: Self-pay | Admitting: Family Medicine

## 2021-02-19 ENCOUNTER — Other Ambulatory Visit: Payer: Self-pay

## 2021-02-19 ENCOUNTER — Telehealth: Payer: Self-pay

## 2021-02-19 ENCOUNTER — Ambulatory Visit (INDEPENDENT_AMBULATORY_CARE_PROVIDER_SITE_OTHER): Payer: 59 | Admitting: Family Medicine

## 2021-02-19 ENCOUNTER — Ambulatory Visit: Payer: 59 | Admitting: Family Medicine

## 2021-02-19 VITALS — BP 138/81 | HR 63 | Wt 193.4 lb

## 2021-02-19 DIAGNOSIS — Z202 Contact with and (suspected) exposure to infections with a predominantly sexual mode of transmission: Secondary | ICD-10-CM | POA: Diagnosis not present

## 2021-02-19 DIAGNOSIS — R413 Other amnesia: Secondary | ICD-10-CM

## 2021-02-19 DIAGNOSIS — G5131 Clonic hemifacial spasm, right: Secondary | ICD-10-CM

## 2021-02-19 DIAGNOSIS — G5139 Clonic hemifacial spasm, unspecified: Secondary | ICD-10-CM | POA: Insufficient documentation

## 2021-02-19 DIAGNOSIS — M79652 Pain in left thigh: Secondary | ICD-10-CM

## 2021-02-19 DIAGNOSIS — G4452 New daily persistent headache (NDPH): Secondary | ICD-10-CM

## 2021-02-19 DIAGNOSIS — Z13228 Encounter for screening for other metabolic disorders: Secondary | ICD-10-CM

## 2021-02-19 DIAGNOSIS — Z114 Encounter for screening for human immunodeficiency virus [HIV]: Secondary | ICD-10-CM | POA: Diagnosis not present

## 2021-02-19 DIAGNOSIS — H269 Unspecified cataract: Secondary | ICD-10-CM | POA: Insufficient documentation

## 2021-02-19 NOTE — Telephone Encounter (Signed)
Spoke with patient informed of MRI at [facility]. July 24th at 12:30. Patient understood. Salvatore Marvel, CMA

## 2021-02-19 NOTE — Progress Notes (Signed)
    SUBJECTIVE:   CHIEF COMPLAINT / HPI:   Facial twitching/Memory loss: The patient came to follow up on his right facial twitching, which started a few weeks ago. The twitching has become daily and now spread from his cheek to the temple. He endorses daily headaches, no N/V. + vision change due to hx of cataract and is yet to follow up with his ophthalmologist.  He also endorses difficulty remembering stuff since he had a COVID-19 infection. He has memory issues with recalling recent conversations.  Left thigh pain: He also c/o left thigh pain which started yesterday while at work. He denies any trauma to the area. Pain worsens when he presses on the area. He does not feel the pain otherwise. No swelling or change in skin color over the area.   PERTINENT  PMH / PSH: pmx reviewed  OBJECTIVE:   Vitals:   02/19/21 0903 02/19/21 0926  BP: (!) 142/81 138/81  Pulse: 63   SpO2: 100%   Weight: 193 lb 6.4 oz (87.7 kg)     Physical Exam Vitals and nursing note reviewed.  Constitutional:      Appearance: Normal appearance.  Cardiovascular:     Rate and Rhythm: Normal rate and regular rhythm.     Pulses: Normal pulses.     Heart sounds: Normal heart sounds. No murmur heard. Pulmonary:     Effort: Pulmonary effort is normal. No respiratory distress.     Breath sounds: Normal breath sounds. No wheezing.  Abdominal:     General: Abdomen is flat. There is no distension.     Palpations: Abdomen is soft. There is no mass.     Tenderness: There is no abdominal tenderness.  Musculoskeletal:     Cervical back: Neck supple.     Right lower leg: No edema.     Left lower leg: No edema.     Comments: No tenderness, swelling or erythema of his left inner thigh  Neurological:     Mental Status: He is alert and oriented to person, place, and time.     Cranial Nerves: Cranial nerves are intact.     Sensory: Sensation is intact.     Motor: Motor function is intact.     Coordination:  Coordination is intact.     Gait: Gait is intact.     Deep Tendon Reflexes: Reflexes are normal and symmetric.     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 Neuro exam otherwise normal. Given daily headaches and intermittent laps in memory, I recommended MRI of the brain. HIV, RPR, TSH and Vit B12 also checked. ED precautions discussed. I will contact him soon with the result.   Thigh benign: Likely due to overuse. Conservative measures for now. Use tylenol as needed. F/U soon if it worsens.  Andrena Mews, MD [place]

## 2021-02-19 NOTE — Telephone Encounter (Signed)
I called and discussed with the patient. He saw ortho this morning and per the patient, they recommended cervical spine MRI and advised him that this can be done at their center with the brain MRI that I recommended. I advised him to have them place the orders and once it is scheduled, we can cancel MRI ordered by our office. He agreed with the plan.

## 2021-02-19 NOTE — Telephone Encounter (Signed)
-----   Message from [name], MD sent at 02/19/2021  2:25 PM EDT ----- Please contact patient to help him schedule MRI of his brain. Thanks.

## 2021-02-19 NOTE — Telephone Encounter (Signed)
Patient walks into [facility] requesting to speak with a nurse. Patient reports he needs an MRI ordered, however I see one was ordered today. Patient reports he came from across the street, however could not give me the name of location. I am assuming [facility]. Will forward to PCP for advisement.

## 2021-02-19 NOTE — Patient Instructions (Signed)
General Headache Without Cause A headache is pain or discomfort that is felt around the head or neck area. There are many causes and types of headaches. In some cases, the cause may not be found. Follow these instructions at home: Watch your condition for any changes. Let your doctor know about them. Take these steps to help with your condition: Managing pain   Take over-the-counter and prescription medicines only as told by your doctor. Lie down in a dark, quiet room when you have a headache. If told, put ice on your head and neck area: Put ice in a plastic bag. Place a towel between your skin and the bag. Leave the ice on for 20 minutes, 2-3 times per day. If told, put heat on the affected area. Use the heat source that your doctor recommends, such as a moist heat pack or a heating pad. Place a towel between your skin and the heat source. Leave the heat on for 20-30 minutes. Remove the heat if your skin turns bright red. This is very important if you are unable to feel pain, heat, or cold. You may have a greater risk of getting burned. Keep lights dim if bright lights bother you or make your headaches worse. Eating and drinking Eat meals on a regular schedule. If you drink alcohol: Limit how much you use to: 0-1 drink a day for women. 0-2 drinks a day for men. Be aware of how much alcohol is in your drink. In the [place] one drink equals one 12 oz bottle of beer (355 mL), one 5 oz glass of wine (148 mL), or one 1 oz glass of hard liquor (44 mL). Stop drinking caffeine, or reduce how much caffeine you drink. General instructions  Keep a journal to find out if certain things bring on headaches. For example, write down: What you eat and drink. How much sleep you get. Any change to your diet or medicines. Get a massage or try other ways to relax. Limit stress. Sit up straight. Do not tighten (tense) your muscles. Do not use any products that contain nicotine or tobacco. This includes  cigarettes, e-cigarettes, and chewing tobacco. If you need help quitting, ask your doctor. Exercise regularly as told by your doctor. Get enough sleep. This often means 7-9 hours of sleep each night. Keep all follow-up visits as told by your doctor. This is important. Contact a doctor if: Your symptoms are not helped by medicine. You have a headache that feels different than the other headaches. You feel sick to your stomach (nauseous) or you throw up (vomit). You have a fever. Get help right away if: Your headache gets very bad quickly. Your headache gets worse after a lot of physical activity. You keep throwing up. You have a stiff neck. You have trouble seeing. You have trouble speaking. You have pain in the eye or ear. Your muscles are weak or you lose muscle control. You lose your balance or have trouble walking. You feel like you will pass out (faint) or you pass out. You are mixed up (confused). You have a seizure. Summary A headache is pain or discomfort that is felt around the head or neck area. There are many causes and types of headaches. In some cases, the cause may not be found. Keep a journal to help find out what causes your headaches. Watch your condition for any changes. Let your doctor know about them. Contact a doctor if you have a headache that is different from usual, or   if your headache is not helped by medicine. Get help right away if your headache gets very bad, you throw up, you have trouble seeing, you lose your balance, or you have a seizure. This information is not intended to replace advice given to you by your health care provider. Make sure you discuss any questions you have with your health care provider. Document Revised: 02/08/2018 Document Reviewed: 02/08/2018 Elsevier Patient Education  2022 [employer]  

## 2021-02-19 NOTE — Assessment & Plan Note (Signed)
No facial asymmetry. Neuro exam otherwise normal. Given daily headaches and intermittent laps in memory, I recommended MRI of the brain. HIV, RPR, TSH and Vit B12 also checked. ED precautions discussed. I will contact him soon with the result.

## 2021-02-20 ENCOUNTER — Other Ambulatory Visit (HOSPITAL_COMMUNITY): Payer: Self-pay | Admitting: Neurosurgery

## 2021-02-20 DIAGNOSIS — M5412 Radiculopathy, cervical region: Secondary | ICD-10-CM

## 2021-02-20 LAB — HIV ANTIBODY (ROUTINE TESTING W REFLEX): HIV Screen 4th Generation wRfx: NONREACTIVE

## 2021-02-20 LAB — TSH: TSH: 1.87 u[IU]/mL (ref 0.450–4.500)

## 2021-02-20 LAB — RPR: RPR Ser Ql: NONREACTIVE

## 2021-02-20 LAB — VITAMIN B12: Vitamin B-12: 446 pg/mL (ref 232–1245)

## 2021-02-20 NOTE — Progress Notes (Signed)
Spoke with patient. Informed of note below. Patient had no questions, and understood. Salvatore Marvel, CMA

## 2021-02-24 ENCOUNTER — Ambulatory Visit (HOSPITAL_COMMUNITY)
Admission: RE | Admit: 2021-02-24 | Discharge: 2021-02-24 | Disposition: A | Discharge status: Home / Self Care | Payer: 59 | Source: Ambulatory Visit | Attending: Family Medicine | Admitting: Family Medicine

## 2021-02-24 ENCOUNTER — Ambulatory Visit (HOSPITAL_COMMUNITY)
Admission: RE | Admit: 2021-02-24 | Discharge: 2021-02-24 | Disposition: A | Discharge status: Home / Self Care | Payer: 59 | Source: Ambulatory Visit | Attending: Neurosurgery | Admitting: Neurosurgery

## 2021-02-24 ENCOUNTER — Other Ambulatory Visit: Payer: Self-pay

## 2021-02-24 DIAGNOSIS — G5131 Clonic hemifacial spasm, right: Secondary | ICD-10-CM | POA: Diagnosis present

## 2021-02-24 DIAGNOSIS — M5412 Radiculopathy, cervical region: Secondary | ICD-10-CM | POA: Diagnosis present

## 2021-02-24 DIAGNOSIS — R413 Other amnesia: Secondary | ICD-10-CM | POA: Diagnosis present

## 2021-02-24 DIAGNOSIS — G4452 New daily persistent headache (NDPH): Secondary | ICD-10-CM | POA: Diagnosis present

## 2021-02-24 MED ORDER — GADOBUTROL 1 MMOL/ML IV SOLN
8.7000 mL | Freq: Once | INTRAVENOUS | Status: AC | PRN
  Administered 2021-02-24: 8.7 mL via INTRAVENOUS

## 2021-02-27 ENCOUNTER — Other Ambulatory Visit: Payer: Self-pay | Admitting: Family Medicine

## 2021-02-27 ENCOUNTER — Telehealth: Payer: Self-pay

## 2021-02-27 DIAGNOSIS — R221 Localized swelling, mass and lump, neck: Secondary | ICD-10-CM

## 2021-02-27 DIAGNOSIS — Q892 Congenital malformations of other endocrine glands: Secondary | ICD-10-CM

## 2021-02-27 DIAGNOSIS — R0602 Shortness of breath: Secondary | ICD-10-CM

## 2021-02-27 NOTE — Addendum Note (Signed)
Addended by: Andrena Mews T on: 02/27/2021 11:18 AM   Modules accepted: Orders

## 2021-02-27 NOTE — Telephone Encounter (Signed)
I called to check on the patient. He is uncertain if the 0000000 report is for his HR or O2 Sat. I doubt it is his O2 Sat. He stated he still has SOB despite extensive workup. He was referred to [facility] in 2020 but never made the appointment. I will place a referral again. ED precautions were discussed.  I again reviewed the CT head with him, which is benign. 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 Monitor for now.  He, however, mentioned that he called about the cyst in his neck from CT ordered by his Spine specialist. 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 His Spine specialist advised him to follow up with me. I discussed CT neck with contrast. He agreed with it. CT ordered.

## 2021-02-27 NOTE — Telephone Encounter (Signed)
Patient calls nurse line reporting low sats after a heavy work load. Patient reports he has been checking his sats periodically during his shift and have persistently in the low 60s. Patient denies SOB or chest pain and was speaking in full sentences. Patient does report he only leaves his pulse ox on his finger for a short period of time. Patient advised to stay hydrated and to take slow deep breaths. Strict ED precautions given to patient.   Patient also reports he had his MRI and a cyst was found. Patient states he was told to FU with his PCP. Patient has a scheduled apt for 8/2.

## 2021-02-28 NOTE — Telephone Encounter (Signed)
Spoke with pt informed him of his CT on 8/4 at [facility]. He also mentioned that he wanted change his reason for visit for his 8/2 appt. To cyst and having low Heart rate. I informed him that if he has trouble breathing or feels funny to go to the ED . Pt understood. Salvatore Marvel, CMA

## 2021-03-05 ENCOUNTER — Ambulatory Visit: Payer: 59 | Admitting: Family Medicine

## 2021-03-07 ENCOUNTER — Telehealth: Payer: Self-pay

## 2021-03-07 ENCOUNTER — Ambulatory Visit (HOSPITAL_COMMUNITY): Payer: 59

## 2021-03-07 NOTE — Telephone Encounter (Signed)
Patient has appt for his CT today 8/4 at [place], [place]

## 2021-03-15 ENCOUNTER — Other Ambulatory Visit: Payer: Self-pay | Admitting: Family Medicine

## 2021-03-15 ENCOUNTER — Other Ambulatory Visit: Payer: Self-pay

## 2021-03-15 ENCOUNTER — Ambulatory Visit: Payer: 59 | Attending: Neurosurgery | Admitting: Rehabilitative and Restorative Service Providers"

## 2021-03-15 ENCOUNTER — Ambulatory Visit (HOSPITAL_COMMUNITY)
Admission: RE | Admit: 2021-03-15 | Discharge: 2021-03-15 | Disposition: A | Discharge status: Home / Self Care | Payer: 59 | Source: Ambulatory Visit | Attending: Family Medicine | Admitting: Family Medicine

## 2021-03-15 ENCOUNTER — Encounter: Payer: Self-pay | Admitting: Rehabilitative and Restorative Service Providers"

## 2021-03-15 DIAGNOSIS — R252 Cramp and spasm: Secondary | ICD-10-CM | POA: Diagnosis present

## 2021-03-15 DIAGNOSIS — M436 Torticollis: Secondary | ICD-10-CM | POA: Diagnosis present

## 2021-03-15 DIAGNOSIS — Q892 Congenital malformations of other endocrine glands: Secondary | ICD-10-CM

## 2021-03-15 DIAGNOSIS — R221 Localized swelling, mass and lump, neck: Secondary | ICD-10-CM | POA: Insufficient documentation

## 2021-03-15 DIAGNOSIS — M542 Cervicalgia: Secondary | ICD-10-CM | POA: Diagnosis present

## 2021-03-15 MED ORDER — IOHEXOL 350 MG/ML SOLN
65.0000 mL | Freq: Once | INTRAVENOUS | Status: AC | PRN
  Administered 2021-03-15: 65 mL via INTRAVENOUS
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
## 2021-03-15 NOTE — Patient Instructions (Signed)
Access Code: [email or web address] URL: [email or web address] Date: 03/15/2021 Prepared by: Shelby Dubin Mahmud Keithly  Exercises Seated Cervical Rotation AROM - 1-2 x daily - 7 x weekly - 2 sets - 10 reps Seated Cervical Flexion AROM - 1-2 x daily - 7 x weekly - 2 sets - 10 reps Seated Cervical Extension AROM - 1-2 x daily - 7 x weekly - 2 sets - 10 reps Seated Upper Trapezius Stretch - 2 x daily - 7 x weekly - 1 sets - 5 reps - 20 sec hold Seated Levator Scapulae Stretch - 2 x daily - 7 x weekly - 1 sets - 5 reps - 20 sec hold Seated Cervical Retraction - 2 x daily - 7 x weekly - 2 sets - 10 reps Standing Shoulder Row with Anchored Resistance - 1-2 x daily - 7 x weekly - 2 sets - 10 reps Shoulder External Rotation and Scapular Retraction with Resistance - 1-2 x daily - 7 x weekly - 2 sets - 10 reps Standing Shoulder Horizontal Abduction with Resistance - 1-2 x daily - 7 x weekly - 2 sets - 10 reps

## 2021-03-18 ENCOUNTER — Telehealth: Payer: Self-pay | Admitting: Family Medicine

## 2021-03-18 DIAGNOSIS — Q892 Congenital malformations of other endocrine glands: Secondary | ICD-10-CM

## 2021-03-18 NOTE — Telephone Encounter (Signed)
Patient calls nurse line regarding ENT referral. Patient reports that he did not like the interaction he had with Dr. Pollie Friar office. Also, they will not be able to get him in until October. Patient is requesting referral be placed at alternate ENT office.   Will forward to PCP and Jazmin.   Talbot Grumbling, RN

## 2021-03-18 NOTE — Telephone Encounter (Signed)
CT neck report discussed. Referral to head and neck specialist discussed. He agreed with the plan.

## 2021-03-18 NOTE — Telephone Encounter (Signed)
Referral faxed to [facility]. Patient is aware and knows that they will call in a few days to get him scheduled.  [facility]  Address: [address], [place], [place] [address] Phone: [phone]

## 2021-03-22 ENCOUNTER — Ambulatory Visit: Payer: 59 | Admitting: Family Medicine

## 2021-04-05 ENCOUNTER — Other Ambulatory Visit: Payer: Self-pay

## 2021-04-05 ENCOUNTER — Encounter: Payer: Self-pay | Admitting: Physical Therapy

## 2021-04-05 ENCOUNTER — Ambulatory Visit: Payer: 59 | Attending: Neurosurgery | Admitting: Physical Therapy

## 2021-04-05 DIAGNOSIS — M542 Cervicalgia: Secondary | ICD-10-CM | POA: Diagnosis present

## 2021-04-05 DIAGNOSIS — R252 Cramp and spasm: Secondary | ICD-10-CM | POA: Insufficient documentation

## 2021-04-05 DIAGNOSIS — M436 Torticollis: Secondary | ICD-10-CM | POA: Insufficient documentation

## 2021-04-05 NOTE — Therapy (Signed)
[place]. [place], [place], [address] Phone: [phone]   Fax:  [phone]  Physical Therapy Treatment  Patient Details  Name: Michael Rogers MRN: [id number] Date of Birth: [birth year] Referring Provider (PT): Dr Duffy Rhody   Encounter Date: 04/05/2021   PT End of Session - 04/05/21 0923     Visit Number 2    Date for PT Re-Evaluation 04/12/21    Authorization Type [employer]    PT Start Time [phone]    PT Stop Time 0925    PT Time Calculation (min) 44 min    Activity Tolerance Patient tolerated treatment well    Behavior During Therapy [facility] for tasks assessed/performed             Past Medical History:  Diagnosis Date   Allergy    Asthma    Chest pain    H. pylori infection    Prostatitis 05/29/2017   Puncture wound of right foot 02/28/2016    Past Surgical History:  Procedure Laterality Date   COLONOSCOPY  2018   MOUTH SURGERY     ROOT CANAL      There were no vitals filed for this visit.   Subjective Assessment - 04/05/21 0843     Subjective some days are better than others, nothing that hinders him    Currently in Pain? Yes    Pain Score 2     Pain Location Neck                               [facility] Adult PT Treatment/Exercise - 04/05/21 0001       Exercises   Exercises Neck      Neck Exercises: Machines for Strengthening   UBE (Upper Arm Bike) L1.9 3 min each    Other Machines for Strengthening Row & Lats 35lb 2x10 each      Neck Exercises: Standing   Other Standing Exercises ER green 2x10    Other Standing Exercises Shoulder Flex & Abd 5lb 2x10      Neck Exercises: Seated   Neck Retraction 20 reps;3 secs      Manual Therapy   Manual Therapy Soft tissue mobilization;Passive ROM    Soft tissue mobilization Cervical para spinales    Passive ROM Cervical spine                      PT Short Term Goals - 04/05/21 0933       PT  SHORT TERM GOAL #1   Title Pt will be independent with initial HEP    Status Partially Met               PT Long Term Goals - 04/05/21 0933       PT LONG TERM GOAL #1   Title Pt will report no more than 2/10 pain after work shift.    Status On-going      PT LONG TERM GOAL #2   Title Pt will be independent in advanced HEP.    Status On-going                   Plan - 04/05/21 3300     Clinical Impression Statement Pt tolerated an initial progression to TE well. Introduced pt to some postoral strengthening interventions, pr did report a pinching sensation in R rear deltoid area with seated rows that went  away as reps progressed. Pt demo ed good strength and ROM throughout session. Positive response to STM, denied modality post session    Personal Factors and Comorbidities Comorbidity 1    Comorbidities Cyst in neck    Stability/Clinical Decision Making Stable/Uncomplicated    Rehab Potential Good    PT Frequency 1x / week    PT Duration 4 weeks    PT Next Visit Plan strengthening             Patient will benefit from skilled therapeutic intervention in order to improve the following deficits and impairments:  Pain, Decreased range of motion, Increased muscle spasms  Visit Diagnosis: Cramp and spasm  Neck stiffness  Cervicalgia     Problem List Patient Active Problem List   Diagnosis Date Noted   Cataract 02/19/2021   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 pylori infection 01/21/2012    Scot Jun 04/05/2021, 9:34 AM  [place]. [place], [place], [address] Phone: [phone]   Fax:  [phone]  Name: JOHNTHOMAS LADER MRN:  [id number] Date of Birth: 12-11-1985

## 2021-08-06 ENCOUNTER — Encounter: Payer: Self-pay | Admitting: *Deleted

## 2021-08-26 ENCOUNTER — Ambulatory Visit (INDEPENDENT_AMBULATORY_CARE_PROVIDER_SITE_OTHER): Payer: BC Managed Care – PPO | Admitting: Family Medicine

## 2021-08-26 ENCOUNTER — Other Ambulatory Visit: Payer: Self-pay

## 2021-08-26 VITALS — BP 127/83 | HR 79 | Wt 206.2 lb

## 2021-08-26 DIAGNOSIS — J029 Acute pharyngitis, unspecified: Secondary | ICD-10-CM | POA: Diagnosis not present

## 2021-08-26 DIAGNOSIS — R059 Cough, unspecified: Secondary | ICD-10-CM | POA: Diagnosis not present

## 2021-08-26 NOTE — Patient Instructions (Signed)
It was great to see you! Thank you for allowing me to participate in your care!  Our plans for today:  -We are checking COVID-19 testing.  We should get the result back in 1 to 2 days and I will let you know the result when it returns. -If you develop any chest pains, trouble breathing, shortness of breath, vomiting to the extent that she cannot keep down fluids, or any other concerning symptoms I would like you to immediately let us know or go to the emergency department.   We are checking some labs today, I will call you if they are abnormal will send you a MyChart message or a letter if they are normal.  If you do not hear about your labs in the next 2 weeks please let us know.  Take care and seek immediate care sooner if you develop any concerns.   Dr. Grayland Daisey, DO [facility]   

## 2021-08-26 NOTE — Progress Notes (Signed)
° ° °  SUBJECTIVE:   CHIEF COMPLAINT / HPI:   Sore throat, myalgias, congestion: Started having sore throat and congestion yesterday. He has had feeling of irritation in his nares. He denies fevers but has had increase in green mucus. He has had increase in draining of mucus. He has had some myalgias. Denies sick contacts, loss of taste or smell. No diarrhea, having BM's about 4x per week over the past week.  No vomiting.  PERTINENT  PMH / PSH: History of asthma  OBJECTIVE:   BP 127/83    Pulse 79    Wt 206 lb 3.2 oz (93.5 kg)    SpO2 99%    BMI 30.45 kg/m    General: NAD, pleasant, able to participate in exam HEENT: No pharyngeal erythema, no cervical lymphadenopathy.  Cardiac: RRR, no murmurs. Respiratory: CTAB, normal effort, No wheezes, rales or rhonchi Abdomen: nontender Skin: warm and dry, no rashes noted  ASSESSMENT/PLAN:    Cough/myalgias/congestion Symptoms been going on for about 1 day.  He has myalgias which are unusual for him as well as cough, congestion, increased mucus.  He denies diarrhea or vomiting.  No sick contacts.  Vitals are stable and within normal limits.  He has lungs clear to auscultation.  Nontoxic-appearing.  We will test for COVID-19/influenza/RSV.  I suspect COVID-19 versus influenza due to his myalgias.  He has no symptoms right now concerning for the need for admission.  Recommended symptomatic care.  Discussed return precautions.  We will give him a work note to keep him out until we get the results back.   Lurline Del, [place]

## 2021-08-27 LAB — COVID-19, FLU A+B AND RSV
Influenza A, NAA: NOT DETECTED
Influenza B, NAA: NOT DETECTED
RSV, NAA: NOT DETECTED
SARS-CoV-2, NAA: NOT DETECTED

## 2021-09-02 ENCOUNTER — Telehealth: Payer: Self-pay

## 2021-09-02 NOTE — Telephone Encounter (Signed)
Patient calls nurse line with concerns for high blood pressure. Patient reports he bought an at home cuff and "randomly" checks his BP. Patient reports he is driving and does not know the exact readings off the top of his head, however remembers "130s and 140s." Patient reports he only checks it when he feels "dizzy." Patient denies chest pain, SOB or syncope episodes. BP in office on 1/23-127/83.  Patient advised to make an apt to discuss. Patient reports he will send PCP a mychart message with readings when he gets home.   Strict ED precautions given.

## 2021-09-13 DIAGNOSIS — Z87891 Personal history of nicotine dependence: Secondary | ICD-10-CM | POA: Diagnosis not present

## 2021-09-13 DIAGNOSIS — J3489 Other specified disorders of nose and nasal sinuses: Secondary | ICD-10-CM | POA: Diagnosis not present

## 2021-09-13 DIAGNOSIS — Q892 Congenital malformations of other endocrine glands: Secondary | ICD-10-CM | POA: Diagnosis not present

## 2021-09-13 DIAGNOSIS — K219 Gastro-esophageal reflux disease without esophagitis: Secondary | ICD-10-CM | POA: Diagnosis not present

## 2021-10-01 ENCOUNTER — Ambulatory Visit: Payer: BC Managed Care – PPO | Admitting: Family Medicine

## 2021-10-09 ENCOUNTER — Encounter: Payer: Self-pay | Admitting: Physical Therapy

## 2021-10-09 NOTE — Therapy (Signed)
[place] ?[place] ?[address] ?[place], [place], [address] ?Phone: [phone]   Fax:  [phone] ? ?Patient Details  ?Name: Michael Rogers ?MRN: [id number] ?Date of Birth: 06/22/1986 ?Referring Provider:  No ref. provider found ? ?Encounter Date: 10/09/2021 ? ?PHYSICAL THERAPY DISCHARGE SUMMARY ? ?Visits from Start of Care: 2 ? ?Current functional level related to goals / functional outcomes: ?Did not return since last session in September; DC per clinic policy  ?  ?Remaining deficits: ?Unable to assess  ?  ?Education / Equipment: ?Unable to assess   ? ?Patient agrees to discharge. Patient goals were not met. Patient is being discharged due to not returning since the last visit. ? ? ?Mahesh Sizemore U PT, DPT, PN2  ? ?Supplemental Physical Therapist ?[place]  ? ? ? ? ? ?[place] ?[address] ?[address] ?[place], [place], [address] ?Phone: [phone]   Fax:  [phone] ?

## 2022-01-07 ENCOUNTER — Encounter: Payer: Self-pay | Admitting: *Deleted

## 2022-02-14 ENCOUNTER — Ambulatory Visit (INDEPENDENT_AMBULATORY_CARE_PROVIDER_SITE_OTHER): Payer: BC Managed Care – PPO | Admitting: Family Medicine

## 2022-02-14 ENCOUNTER — Encounter: Payer: Self-pay | Admitting: Family Medicine

## 2022-02-14 VITALS — BP 139/71 | HR 87 | Ht 69.0 in | Wt 196.2 lb

## 2022-02-14 DIAGNOSIS — M791 Myalgia, unspecified site: Secondary | ICD-10-CM

## 2022-02-14 DIAGNOSIS — F17200 Nicotine dependence, unspecified, uncomplicated: Secondary | ICD-10-CM

## 2022-02-14 DIAGNOSIS — R252 Cramp and spasm: Secondary | ICD-10-CM

## 2022-02-14 DIAGNOSIS — G253 Myoclonus: Secondary | ICD-10-CM

## 2022-02-14 DIAGNOSIS — L989 Disorder of the skin and subcutaneous tissue, unspecified: Secondary | ICD-10-CM | POA: Insufficient documentation

## 2022-02-14 DIAGNOSIS — R0789 Other chest pain: Secondary | ICD-10-CM | POA: Diagnosis not present

## 2022-02-14 DIAGNOSIS — R202 Paresthesia of skin: Secondary | ICD-10-CM | POA: Diagnosis not present

## 2022-02-14 DIAGNOSIS — Z87891 Personal history of nicotine dependence: Secondary | ICD-10-CM | POA: Diagnosis not present

## 2022-02-14 NOTE — Assessment & Plan Note (Signed)
Hx of similar episode in the past with cardiology evaluation Monitor for now with ED precautions.

## 2022-02-14 NOTE — Assessment & Plan Note (Signed)
Faded cherry hemangioma Seems resolving Monitor closely for now

## 2022-02-14 NOTE — Assessment & Plan Note (Signed)
Persistent and worsening. Unclear if this is seizure related. He endorses hx of tobacco smoking which he is now cutting back on. I advised that symptoms might improve with tobacco cessation. Neurology referral for EEG is appropriate at this time.  He agreed with the plan.

## 2022-02-14 NOTE — Progress Notes (Signed)
SUBJECTIVE:   CHIEF COMPLAINT / HPI:   Jerky movement: Patient c/o jerking movement in his sleep for 3-5 months noticed by his wife. He does not have any recollection of this. However, in the morning, sometimes, he will feel weak and tired. Per his wife, he jerks in his sleep as if he is having an actual seizure. This occurs daily or every other day. He denies fainting during the day or LOC.  Hand cramping: C/O frequent muscle cramping along the lateral border of his left 5th finger. This makes his finger pull in for about 5 minutes with intense pain. He showed me a video of it on his phone.  Chest pain: Last night, he had an episode of central to left-sided sharp chest pain, which improved after he coughed and cleared his throat. He denies SOB, wheezing, or chest pain at the moment.   Scalp lesion: C/O a small lesion on his left temple which he noticed about two weeks ago. The lesion was small and red initially. However, it is gradually fading out.   PERTINENT  PMH / PSH: PMHx reviewed  OBJECTIVE:   Vitals:   02/14/22 1045  BP: 139/71  Pulse: 87  Weight: 196 lb 3.2 oz (89 kg)  Height: '5\' 9"'$  (1.753 m)    Physical Exam Vitals and nursing note reviewed.  HENT:     Head:     Comments: Very mild cherry pink lesion measuring < 0.5 mm on his left temple. Cardiovascular:     Rate and Rhythm: Normal rate and regular rhythm.     Heart sounds: Normal heart sounds. No murmur heard. Pulmonary:     Effort: Pulmonary effort is normal. No respiratory distress.     Breath sounds: Normal breath sounds. No wheezing.  Abdominal:     General: Abdomen is flat. Bowel sounds are normal. There is no distension.     Palpations: Abdomen is soft.  Musculoskeletal:     Right hand: Normal.     Left hand: Normal.     Cervical back: Neck supple.     Right lower leg: No edema.     Left lower leg: No edema.  Neurological:     General: No focal deficit present.     Mental Status: He is oriented  to person, place, and time.     Cranial Nerves: Cranial nerves 2-12 are intact.     Sensory: Sensation is intact.     Motor: Motor function is intact.     Coordination: Coordination is intact.     Gait: Gait is intact.     Deep Tendon Reflexes: Reflexes are normal and symmetric.      ASSESSMENT/PLAN:   Myoclonic jerking while sleeping Persistent and worsening. Unclear if this is seizure related. He endorses hx of tobacco smoking which he is now cutting back on. I advised that symptoms might improve with tobacco cessation. Neurology referral for EEG is appropriate at this time.  He agreed with the plan.   Muscle cramping I reviewed the video of this episode on his cell phone + Intense cramping of his hypothenar and abductor minimus with pulling of his 5th finger towards the 4th and the 3rd fingers. Etiology unclear. Differentials include hypocalcemia vs hypomagnesemia Lab drawn to evaluate for both. Consider muscle relaxant in the future depending on his test result. I will contact him soon to discuss.  Atypical chest pain Hx of similar episode in the past with cardiology evaluation Monitor for now with ED  precautions.   Scalp lesion Faded cherry hemangioma Seems resolving Monitor closely for now     Andrena Mews, MD [facility]

## 2022-02-14 NOTE — Patient Instructions (Signed)
Myoclonus Myoclonus is the sudden and quick movement of the muscles. It may include muscle jerks, twitches, or spasms that happen without a person's control (involuntary). There are different types of myoclonus. One type, called physiologic myoclonus, occurs normally in most people and rarely needs treatment. This type includes: Hiccups. Sleep starts, or twitching during sleep. Other types are caused by an underlying condition and may require treatment. What are the causes? The cause of this condition varies depending on the type of myoclonus. Physiologic myoclonus, such as hiccups, results from normal actions of the body. Other causes include: Genetic condition or disease. This causes a type of myoclonus called essential myoclonus. Epilepsy. This causes epileptic myoclonus. Other underlying conditions cause secondary or symptomatic myoclonus. These include: Side effects of certain medicines. Metabolic conditions. Head, brain, or spinal injuries. Stroke. Nervous system conditions such as dementia, Parkinson's disease, and Alzheimer's disease. Infections. Poisoning. Brain tumors. What are the signs or symptoms? The main symptom of this condition is sudden spasms, jerking, or uncontrollable movements. Symptoms may: Occur in only one area of the body or over the entire body. Come and go. Vary in intensity. Make it hard for you to eat, speak, or walk. How is this diagnosed?  This condition is diagnosed based on your medical history, a review of your symptoms, and a physical exam. You may also have tests, including: Electroencephalogram (EEG). This test checks the electrical activity of the brain. Electromyogram (EMG). This test measures electrical activity in the muscles. Imaging tests such as an MRI or CT scan. Lumbar puncture (spinal tap) to check spinal fluid for infection or inflammation. Blood tests. Urine tests. How is this treated? Treatment for this condition depends on the  underlying condition and the severity of your symptoms. Treatment may include: Medicines, such as tranquilizer and anti-seizure medicines. Treating the underlying cause, such as having a tumor removed or taking antibiotic medicine for an infection. Injections of a substance called botulinum toxin. Physiologic myoclonus does not require treatment. Follow these instructions at home: Take over-the-counter and prescription medicines only as told by your health care provider. If you are taking tranquilizer or anti-seizure medicines, do not drive or use machinery until your body adjusts to the medicine. These medicines may cause drowsiness. Keep a journal of your symptoms and the things that seem to trigger them or make them worse. Also, try to identity the things that make them better. Share this information with your health care provider. Keep all follow-up visits. This is important. Where to find more information National Institute of Neurological Disorders and Stroke: www.ninds.nih.gov Contact a health care provider if: You have severe pain and medicines do not help. You have problems taking your medicines. Your twitches, jerks, or spasms get worse. Get help right away if: You have a seizure. You have a reaction to your medicines, such as a rash, trouble breathing, or swelling. You have trouble staying alert. These symptoms may be an emergency. Get help right away. Call 911. Do not wait to see if the symptoms will go away. Do not drive yourself to the hospital. Summary Myoclonus is the sudden and quick movement of the muscles. It may include muscle jerks, twitches, or spasms that happen without a person's control (involuntary). There are different types of myoclonus. Physiologic myoclonus occurs normally in most people and rarely needs treatment. Other types of myoclonus are caused by an underlying condition. Treatment for this condition depends on the underlying condition and the severity of  your symptoms. If you are taking   tranquilizer or anti-seizure medicines, do not drive or use machinery until your body adjusts to the medicine. These medicines may cause drowsiness. This information is not intended to replace advice given to you by your health care provider. Make sure you discuss any questions you have with your health care provider. Document Revised: 05/10/2021 Document Reviewed: 05/10/2021 Elsevier Patient Education  2023 Elsevier Inc.  

## 2022-02-14 NOTE — Assessment & Plan Note (Signed)
I reviewed the video of this episode on his cell phone + Intense cramping of his hypothenar and abductor minimus with pulling of his 5th finger towards the 4th and the 3rd fingers. Etiology unclear. Differentials include hypocalcemia vs hypomagnesemia Lab drawn to evaluate for both. Consider muscle relaxant in the future depending on his test result. I will contact him soon to discuss.

## 2022-02-15 LAB — CMP14+EGFR
ALT: 40 IU/L (ref 0–44)
AST: 25 IU/L (ref 0–40)
Albumin/Globulin Ratio: 2.3 — ABNORMAL HIGH (ref 1.2–2.2)
Albumin: 5.2 g/dL — ABNORMAL HIGH (ref 4.1–5.1)
Alkaline Phosphatase: 68 IU/L (ref 44–121)
BUN/Creatinine Ratio: 14 (ref 9–20)
BUN: 14 mg/dL (ref 6–20)
Bilirubin Total: 0.6 mg/dL (ref 0.0–1.2)
CO2: 26 mmol/L (ref 20–29)
Calcium: 10.3 mg/dL — ABNORMAL HIGH (ref 8.7–10.2)
Chloride: 99 mmol/L (ref 96–106)
Creatinine, Ser: 1.01 mg/dL (ref 0.76–1.27)
Globulin, Total: 2.3 g/dL (ref 1.5–4.5)
Glucose: 93 mg/dL (ref 70–99)
Potassium: 4.5 mmol/L (ref 3.5–5.2)
Sodium: 139 mmol/L (ref 134–144)
Total Protein: 7.5 g/dL (ref 6.0–8.5)
eGFR: 99 mL/min/{1.73_m2} (ref >59)

## 2022-02-15 LAB — VITAMIN B12: Vitamin B-12: 399 pg/mL (ref 232–1245)

## 2022-02-15 LAB — TSH: TSH: 1.46 u[IU]/mL (ref 0.450–4.500)

## 2022-02-15 LAB — MAGNESIUM: Magnesium: 2.3 mg/dL (ref 1.6–2.3)

## 2022-02-17 ENCOUNTER — Telehealth: Payer: Self-pay | Admitting: Family Medicine

## 2022-02-17 MED ORDER — BACLOFEN 5 MG PO TABS: 5.0000 mg | ORAL_TABLET | Freq: Every day | ORAL | 0 refills | Status: DC | PRN

## 2022-02-17 NOTE — Telephone Encounter (Signed)
Lab result discussed. Ca++ negligibly elevated.  Mg++ normal. Unclear the cause of his hand muscle spasm. Trial of baclofen discussed. F/U appointment made.

## 2022-02-24 ENCOUNTER — Encounter: Payer: Self-pay | Admitting: Neurology

## 2022-02-24 ENCOUNTER — Ambulatory Visit (INDEPENDENT_AMBULATORY_CARE_PROVIDER_SITE_OTHER): Payer: BC Managed Care – PPO | Admitting: Neurology

## 2022-02-24 VITALS — BP 122/83 | HR 51 | Ht 69.0 in | Wt 202.5 lb

## 2022-02-24 DIAGNOSIS — R569 Unspecified convulsions: Secondary | ICD-10-CM | POA: Diagnosis not present

## 2022-02-24 NOTE — Patient Instructions (Addendum)
Routine EEG If normal we will proceed with 48-hour ambulatory EEG Continue to follow-up with the PCP Follow-up in 3 months

## 2022-02-24 NOTE — Progress Notes (Signed)
[facility]  PATIENT: Michael Rogers DOB: July 25, 1986  REQUESTING CLINICIAN: [name], MD HISTORY FROM: Patient  REASON FOR VISIT: Seizure like activity    HISTORICAL  CHIEF COMPLAINT:  Chief Complaint  Patient presents with   New Patient (Initial Visit)    Rm 12. Alone. NP internal referral for Myoclonic jerking while sleeping.    HISTORY OF PRESENT ILLNESS:  This is a 36 year old gentleman past medical history of asthma and bradycardia who is presenting with seizure-like activity.  Patient reports episode of jerk like movement during sleep, stated that he does not remember them, he is not aware of it until wife tell him about the episodes.  I was able to spoke with wife over the phone, and she reports that he is having these episodes about once a month. These episodes usually involve the entire body,, wife reports these episodes will wake her up from sleep and last about 5 seconds.  Afterward, patient will go to sleep.  Patient reports at times he woke up with tongue biting, lateral side of his tongue and sometimes he will wake up feeling very tired.  He has a history of dropping things in the morning, denies any staring spells.  He denies any previous history of seizures.   OTHER MEDICAL CONDITIONS: Asthma, Bradycardia    REVIEW OF SYSTEMS: Full 14 system review of systems performed and negative with exception of: as noted in the HPI   ALLERGIES: Allergies  Allergen Reactions   Amoxicillin     Other reaction(s): Dizziness Dizziness, lightheadedness,nausea   Dairycare [Lactase-Lactobacillus]    Dust Mite Extract Itching    Sneezing & Itchiness In Eyes + pollen and grass. .   Other Itching and Other (See Comments)    Patient prefers to take liquid instead of tablets or capsule. Also pt states he is allergic to grass which causes itching & sneezing.   Pork-Derived Products Nausea Only    HOME MEDICATIONS: Outpatient Medications Prior to  Visit  Medication Sig Dispense Refill   baclofen 5 MG TABS Take 5 mg by mouth daily as needed for muscle spasms. (Patient not taking: Reported on 02/24/2022) 15 tablet 0   albuterol (VENTOLIN HFA) 108 (90 Base) MCG/ACT inhaler Inhale 2 puffs into the lungs every 6 (six) hours as needed for wheezing or shortness of breath. (Patient not taking: No sig reported) 8 g 2   ELDERBERRY PO Take by mouth. (Patient not taking: Reported on 02/14/2022)     montelukast (SINGULAIR) 10 MG tablet Take 1 tablet (10 mg total) by mouth at bedtime. (Patient not taking: No sig reported) 30 tablet 2   No facility-administered medications prior to visit.    PAST MEDICAL HISTORY: Past Medical History:  Diagnosis Date   Allergy    Asthma    Chest pain    H. pylori infection    Prostatitis 05/29/2017   Puncture wound of right foot 02/28/2016    PAST SURGICAL HISTORY: Past Surgical History:  Procedure Laterality Date   COLONOSCOPY  2018   MOUTH SURGERY     ROOT CANAL      FAMILY HISTORY: Family History  Problem Relation Age of Onset   Kidney disease Father    Hypertension Paternal Grandmother    Heart disease Paternal Uncle    Diabetes Maternal Aunt    Colon cancer Neg Hx    Stomach cancer Neg Hx     SOCIAL HISTORY: Social History   Socioeconomic History   Marital status:  Significant Other    Spouse name: Not on file   Number of children: 1   Years of education: Not on file   Highest education level: Not on file  Occupational History   Occupation: old ne[employer]  Tobacco Use   Smoking status: Former    Packs/day: 0.50    Years: 10.00    Total pack years: 5.00    Types: Cigarettes    Start date: 03/17/2013    Quit date: 09/28/2013    Years since quitting: 8.4   Smokeless tobacco: Never  Vaping Use   Vaping Use: Never used  Substance and Sexual Activity   Alcohol use: Yes    Comment: rarely    Drug use: Yes    Types: Marijuana    Comment: quit 2020   Sexual activity: Yes    Partners:  Female    Birth control/protection: None  Other Topics Concern   Not on file  Social History Narrative   Lives with mother.     History of incarceration in 2012.   Social Determinants of Health   Financial Resource Strain: Not on file  Food Insecurity: Not on file  Transportation Needs: Not on file  Physical Activity: Not on file  Stress: Not on file  Social Connections: Not on file  Intimate Partner Violence: Not on file    PHYSICAL EXAM  GENERAL EXAM/CONSTITUTIONAL: Vitals:  Vitals:   02/24/22 1452  BP: 122/83  Pulse: (!) 51  Weight: 202 lb 8 oz (91.9 kg)  Height: '5\' 9"'$  (1.753 m)   Body mass index is 29.9 kg/m. Wt Readings from Last 3 Encounters:  02/24/22 202 lb 8 oz (91.9 kg)  02/14/22 196 lb 3.2 oz (89 kg)  08/26/21 206 lb 3.2 oz (93.5 kg)   Patient is in no distress; well developed, nourished and groomed; neck is supple  EYES: Pupils round and reactive to light, Visual fields full to confrontation, Extraocular movements intacts,   MUSCULOSKELETAL: Gait, strength, tone, movements noted in Neurologic exam below  NEUROLOGIC: MENTAL STATUS:      No data to display         awake, alert, oriented to person, place and time recent and remote memory intact normal attention and concentration language fluent, comprehension intact, naming intact fund of knowledge appropriate  CRANIAL NERVE:  2nd, 3rd, 4th, 6th - pupils equal and reactive to light, visual fields full to confrontation, extraocular muscles intact, no nystagmus 5th - facial sensation symmetric 7th - facial strength symmetric 8th - hearing intact 9th - palate elevates symmetrically, uvula midline 11th - shoulder shrug symmetric 12th - tongue protrusion midline  MOTOR:  normal bulk and tone, full strength in the BUE, BLE  SENSORY:  normal and symmetric to light touch, pinprick, temperature, vibration  COORDINATION:  finger-nose-finger, fine finger movements normal  REFLEXES:  deep  tendon reflexes present and symmetric  GAIT/STATION:  normal   DIAGNOSTIC DATA (LABS, IMAGING, TESTING) - I reviewed patient records, labs, notes, testing and imaging myself where available.  Lab Results  Component Value Date   WBC 6.7 09/21/2018   HGB 15.4 09/21/2018   HCT 45.2 09/21/2018   MCV 97 09/21/2018   PLT 249 09/21/2018      Component Value Date/Time   NA 139 02/14/2022 1108   K 4.5 02/14/2022 1108   CL 99 02/14/2022 1108   CO2 26 02/14/2022 1108   GLUCOSE 93 02/14/2022 1108   GLUCOSE 101 (H) 09/28/2017 2127   BUN 14 02/14/2022 1108  CREATININE 1.01 02/14/2022 1108   CALCIUM 10.3 (H) 02/14/2022 1108   PROT 7.5 02/14/2022 1108   ALBUMIN 5.2 (H) 02/14/2022 1108   AST 25 02/14/2022 1108   ALT 40 02/14/2022 1108   ALKPHOS 68 02/14/2022 1108   BILITOT 0.6 02/14/2022 1108   GFRNONAA 108 09/21/2018 1124   GFRAA 125 09/21/2018 1124   No results found for: "CHOL", "HDL", "LDLCALC", "LDLDIRECT", "TRIG", "CHOLHDL" Lab Results  Component Value Date   HGBA1C 5.2 09/21/2018   Lab Results  Component Value Date   VITAMINB12 399 02/14/2022   Lab Results  Component Value Date   TSH 1.460 02/14/2022    MRI Brain 02/24/22 Unremarkable appearance of the brain.    ASSESSMENT AND PLAN  36 y.o. year old male with history of bradycardia and asthma who is presenting with episodes of jerk like movements during sleep.  Wife reported that these episodes will wake her up from sleep and last about 5 seconds.  She never witnesses the start of the episode.  Patient reports at times he will wake up with tongue biting and feeling very tired the next day.  I have informed them that I am highly suspicious for epilepsy.  That we will start with a routine EEG but if normal then I will proceed with a 48-hour ambulatory EEG.  In the case that the 48-hour ambulatory EEG is normal and patient continues to experience these episodes, I will start antiseizure medication.  They are comfortable  with plan.  I will see him in 3 months for follow-up.  All other questions answered.   1. Seizure-like activity [facility])      Patient Instructions  Routine EEG If normal we will proceed with 48-hour ambulatory EEG Continue to follow-up with the PCP Follow-up in 3 months  Orders Placed This Encounter  Procedures   EEG adult    No orders of the defined types were placed in this encounter.   Return in about 3 months (around 05/27/2022).  I have spent a total of 45 minutes dedicated to this patient today, preparing to see patient, performing a medically appropriate examination and evaluation, ordering tests and/or medications and procedures, and counseling and educating the patient/family/caregiver; independently interpreting result and communicating results to the family/patient/caregiver; and documenting clinical information in the electronic medical record.   Alric Ran, MD 02/24/2022, 3:22 PM  [facility] [address], [place], [place] [address] [phone]

## 2022-03-05 ENCOUNTER — Ambulatory Visit: Payer: BC Managed Care – PPO | Admitting: Neurology

## 2022-03-05 DIAGNOSIS — R569 Unspecified convulsions: Secondary | ICD-10-CM

## 2022-03-05 NOTE — Procedures (Signed)
    History:  36 year old man with seizure like activity   EEG classification: Awake and drowsy  Description of the recording: The background rhythms of this recording consists of a fairly well modulated medium amplitude alpha rhythm of 114-12 Hz that is reactive to eye opening and closure. As the record progresses, the patient appears to remain in the waking state throughout the recording. Photic stimulation was performed, did not show any abnormalities. Hyperventilation was also performed, did not show any abnormalities. Toward the end of the recording, the patient enters the drowsy state with slight symmetric slowing seen. The patient never enters stage II sleep. No abnormal epileptiform discharges seen during this recording. There was no focal slowing. EKG monitor shows no evidence of cardiac rhythm abnormalities with a heart rate of 66.  Abnormality: None   Impression: This is a normal EEG recording in the waking and drowsy state. No evidence of interictal epileptiform discharges seen. A normal EEG does not exclude a diagnosis of epilepsy.    Michael Ran, MD [facility]

## 2022-03-06 ENCOUNTER — Other Ambulatory Visit: Payer: Self-pay | Admitting: Neurology

## 2022-03-06 DIAGNOSIS — R569 Unspecified convulsions: Secondary | ICD-10-CM

## 2022-03-17 ENCOUNTER — Encounter: Payer: Self-pay | Admitting: Family Medicine

## 2022-03-17 DIAGNOSIS — Q892 Congenital malformations of other endocrine glands: Secondary | ICD-10-CM | POA: Insufficient documentation

## 2022-03-18 ENCOUNTER — Ambulatory Visit (INDEPENDENT_AMBULATORY_CARE_PROVIDER_SITE_OTHER): Payer: BC Managed Care – PPO | Admitting: Family Medicine

## 2022-03-18 ENCOUNTER — Encounter: Payer: Self-pay | Admitting: Family Medicine

## 2022-03-18 VITALS — BP 127/83 | HR 64 | Ht 69.0 in | Wt 198.2 lb

## 2022-03-18 DIAGNOSIS — G253 Myoclonus: Secondary | ICD-10-CM

## 2022-03-18 DIAGNOSIS — G4485 Primary stabbing headache: Secondary | ICD-10-CM | POA: Diagnosis not present

## 2022-03-18 DIAGNOSIS — R252 Cramp and spasm: Secondary | ICD-10-CM

## 2022-03-18 DIAGNOSIS — M79642 Pain in left hand: Secondary | ICD-10-CM

## 2022-03-18 DIAGNOSIS — R519 Headache, unspecified: Secondary | ICD-10-CM | POA: Diagnosis not present

## 2022-03-18 DIAGNOSIS — M79641 Pain in right hand: Secondary | ICD-10-CM

## 2022-03-18 NOTE — Assessment & Plan Note (Signed)
Hand cramping Improved. May use Baclofen prn.

## 2022-03-18 NOTE — Assessment & Plan Note (Addendum)
??   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 Tylenol as needed for pain. Visual acuity completed with glasses on - 20/30 B/L I advised that he contact his ophthalmology this week for an appointment to discuss vision issue. GCA is less likely as he does not meet the criteria. ESR was however, obtained. ED precautions discussed. I will contact him soon with his test results.

## 2022-03-18 NOTE — Assessment & Plan Note (Signed)
I reviewed his EEG report and neurology encounter note. I advised him to schedule f/u with neuro soon to schedule prolonged EEG eval. He agreed with the plan.

## 2022-03-18 NOTE — Progress Notes (Signed)
    SUBJECTIVE:   CHIEF COMPLAINT / HPI:   Hand Cramping: Here for follow-up. Cramping improved some. He s yet to pick up his Baclofen. No new concerns.  Myoclonic jerking: He was recently evaluated by the neurologist and had a normal EEG. They plan on getting 48 hrs EEG done, which is yet to be scheduled.  Headache: C/O sharp pain behind his right eye that shoots to his temple and then to his right ear. Pain last about 2-3 mins and then resolves. He might have several episodes per day. Symptoms are associated with vision change. He denies any known triggers. Denies hx of Migraine HA. Last seem by his ophthalmologist less than a month ago.  PERTINENT  PMH / PSH: PMHx reviewed  OBJECTIVE:   BP 127/83   Pulse 64   Ht $R'5\' 9"'XI$  (1.753 m)   Wt 198 lb 3.2 oz (89.9 kg)   SpO2 98%   BMI 29.27 kg/m   Physical Exam Vitals and nursing note reviewed.  Constitutional:      Appearance: Normal appearance.  HENT:     Head: Normocephalic.     Jaw: No tenderness.     Comments: . No palpable temporal artery. Right ear impacted cerumen    Right Ear: Tympanic membrane normal.     Left Ear: Tympanic membrane and ear canal normal.  Eyes:     Pupils: Pupils are equal, round, and reactive to light.  Cardiovascular:     Rate and Rhythm: Normal rate and regular rhythm.     Heart sounds: Normal heart sounds. No murmur heard. Pulmonary:     Effort: Pulmonary effort is normal. No respiratory distress.     Breath sounds: Normal breath sounds. No wheezing.  Musculoskeletal:     Cervical back: Neck supple.  Neurological:     General: No focal deficit present.      ASSESSMENT/PLAN:   Muscle cramping Hand cramping Improved. May use Baclofen prn.  Myoclonic jerking while sleeping I reviewed his EEG report and neurology encounter note. I advised him to schedule f/u with neuro soon to schedule prolonged EEG eval. He agreed with the plan.   Headache ?? Occular migraine, less likely Giant Cell  Arteritis. Tylenol as needed for pain. Visual acuity completed with glasses on - 20/40 B/L I advised that he contact his ophthalmology this week for an appointment to discuss vision issue. GCA is less likely as he does not meet the criteria. ESR was however, obtained. ED precautions discussed. I will contact him soon with his test results.      Andrena Mews, MD [place]

## 2022-03-18 NOTE — Patient Instructions (Signed)
Migraine Headache ?A migraine headache is a very strong throbbing pain on one side or both sides of your head. This type of headache can also cause other symptoms. It can last from 4 hours to 3 days. Talk with your doctor about what things may bring on (trigger) this condition. ?What are the causes? ?The exact cause of this condition is not known. This condition may be triggered or caused by: ?Drinking alcohol. ?Smoking. ?Taking medicines, such as: ?Medicine used to treat chest pain (nitroglycerin). ?Birth control pills. ?Estrogen. ?Some blood pressure medicines. ?Eating or drinking certain products. ?Doing physical activity. ?Other things that may trigger a migraine headache include: ?Having a menstrual period. ?Pregnancy. ?Hunger. ?Stress. ?Not getting enough sleep or getting too much sleep. ?Weather changes. ?Tiredness (fatigue). ?What increases the risk? ?Being 25-55 years old. ?Being male. ?Having a family history of migraine headaches. ?Being Caucasian. ?Having depression or anxiety. ?Being very overweight. ?What are the signs or symptoms? ?A throbbing pain. This pain may: ?Happen in any area of the head, such as on one side or both sides. ?Make it hard to do daily activities. ?Get worse with physical activity. ?Get worse around bright lights or loud noises. ?Other symptoms may include: ?Feeling sick to your stomach (nauseous). ?Vomiting. ?Dizziness. ?Being sensitive to bright lights, loud noises, or smells. ?Before you get a migraine headache, you may get warning signs (an aura). An aura may include: ?Seeing flashing lights or having blind spots. ?Seeing bright spots, halos, or zigzag lines. ?Having tunnel vision or blurred vision. ?Having numbness or a tingling feeling. ?Having trouble talking. ?Having weak muscles. ?Some people have symptoms after a migraine headache (postdromal phase), such as: ?Tiredness. ?Trouble thinking (concentrating). ?How is this treated? ?Taking medicines that: ?Relieve  pain. ?Relieve the feeling of being sick to your stomach. ?Prevent migraine headaches. ?Treatment may also include: ?Having acupuncture. ?Avoiding foods that bring on migraine headaches. ?Learning ways to control your body functions (biofeedback). ?Therapy to help you know and deal with negative thoughts (cognitive behavioral therapy). ?Follow these instructions at home: ?Medicines ?Take over-the-counter and prescription medicines only as told by your doctor. ?Ask your doctor if the medicine prescribed to you: ?Requires you to avoid driving or using heavy machinery. ?Can cause trouble pooping (constipation). You may need to take these steps to prevent or treat trouble pooping: ?Drink enough fluid to keep your pee (urine) pale yellow. ?Take over-the-counter or prescription medicines. ?Eat foods that are high in fiber. These include beans, whole grains, and fresh fruits and vegetables. ?Limit foods that are high in fat and sugar. These include fried or sweet foods. ?Lifestyle ?Do not drink alcohol. ?Do not use any products that contain nicotine or tobacco, such as cigarettes, e-cigarettes, and chewing tobacco. If you need help quitting, ask your doctor. ?Get at least 8 hours of sleep every night. ?Limit and deal with stress. ?General instructions ? ?  ? ?Keep a journal to find out what may bring on your migraine headaches. For example, write down: ?What you eat and drink. ?How much sleep you get. ?Any change in what you eat or drink. ?Any change in your medicines. ?If you have a migraine headache: ?Avoid things that make your symptoms worse, such as bright lights. ?It may help to lie down in a dark, quiet room. ?Do not drive or use heavy machinery. ?Ask your doctor what activities are safe for you. ?Keep all follow-up visits as told by your doctor. This is important. ?Contact a doctor if: ?You get a migraine   headache that is different or worse than others you have had. ?You have more than 15 headache days in one  month. ?Get help right away if: ?Your migraine headache gets very bad. ?Your migraine headache lasts longer than 72 hours. ?You have a fever. ?You have a stiff neck. ?You have trouble seeing. ?Your muscles feel weak or like you cannot control them. ?You start to lose your balance a lot. ?You start to have trouble walking. ?You pass out (faint). ?You have a seizure. ?Summary ?A migraine headache is a very strong throbbing pain on one side or both sides of your head. These headaches can also cause other symptoms. ?This condition may be treated with medicines and changes to your lifestyle. ?Keep a journal to find out what may bring on your migraine headaches. ?Contact a doctor if you get a migraine headache that is different or worse than others you have had. ?Contact your doctor if you have more than 15 headache days in a month. ?This information is not intended to replace advice given to you by your health care provider. Make sure you discuss any questions you have with your health care provider. ?Document Revised: 11/12/2018 Document Reviewed: 09/02/2018 ?Elsevier Patient Education ? 2023 Elsevier Inc. ? ?

## 2022-03-19 LAB — SEDIMENTATION RATE: Sed Rate: 4 mm/hr (ref 0–15)

## 2022-06-02 ENCOUNTER — Telehealth: Payer: Self-pay | Admitting: Neurology

## 2022-06-02 NOTE — Telephone Encounter (Signed)
Pt is asking if since his EEG came back normal does he really need to come in tomorrow, please call.

## 2022-06-02 NOTE — Telephone Encounter (Signed)
We would like to keep appt as scheduled.

## 2022-06-02 NOTE — Telephone Encounter (Signed)
The message was relayed to pt, due to a meeting a work pt had to r/s.  Pt is on 09-17-22 and on wait list. This is FYI to POD 2

## 2022-06-03 ENCOUNTER — Ambulatory Visit: Payer: BC Managed Care – PPO | Admitting: Neurology

## 2022-06-11 ENCOUNTER — Ambulatory Visit (INDEPENDENT_AMBULATORY_CARE_PROVIDER_SITE_OTHER): Payer: BC Managed Care – PPO | Admitting: Neurology

## 2022-06-11 ENCOUNTER — Encounter: Payer: Self-pay | Admitting: Neurology

## 2022-06-11 VITALS — BP 117/75 | HR 54 | Ht 69.6 in | Wt 195.0 lb

## 2022-06-11 DIAGNOSIS — R569 Unspecified convulsions: Secondary | ICD-10-CM

## 2022-06-11 MED ORDER — VALPROIC ACID 250 MG/5ML PO SOLN: 500.0000 mg | Freq: Two times a day (BID) | ORAL | 11 refills | Status: DC

## 2022-06-11 NOTE — Patient Instructions (Signed)
Start with Depakote 500 mg twice daily  Ambulatory EEG  Follow up in 3 months or sooner if worse

## 2022-06-11 NOTE — Progress Notes (Signed)
[facility]  PATIENT: Michael Rogers DOB: [birth year]  REQUESTING CLINICIAN: [name], MD HISTORY FROM: Patient  REASON FOR VISIT: Seizure like activity    HISTORICAL  CHIEF COMPLAINT:  Chief Complaint  Patient presents with   Follow-up    Rm 13. Alone. No new seizure like activity reported, previous events occurred at night. Hx of syncopal episode with bradycardia.   INTERVAL HISTORY 06/11/22 Patient presents today for follow-up, last visit was in July, since then he reports that he has been sleeping alone, therefore he is unsure if he still having these events but he woke up a couple times with tongue biting and feeling tired the next day.  He denies any events during the daytime. He was not able to complete his ambulatory EEG, his routine EEG was normal.   HISTORY OF PRESENT ILLNESS:  This is a 36 year old gentleman past medical history of asthma and bradycardia who is presenting with seizure-like activity.  Patient reports episode of jerk like movement during sleep, stated that he does not remember them, he is not aware of it until wife tell him about the episodes.  I was able to spoke with wife over the phone, and she reports that he is having these episodes about once a month. These episodes usually involve the entire body,, wife reports these episodes will wake her up from sleep and last about 5 seconds.  Afterward, patient will go to sleep.  Patient reports at times he woke up with tongue biting, lateral side of his tongue and sometimes he will wake up feeling very tired.  He has a history of dropping things in the morning, denies any staring spells.  He denies any previous history of seizures.   OTHER MEDICAL CONDITIONS: Asthma, Bradycardia    REVIEW OF SYSTEMS: Full 14 system review of systems performed and negative with exception of: as noted in the HPI   ALLERGIES: Allergies  Allergen Reactions   Amoxicillin     Other reaction(s):  Dizziness Dizziness, lightheadedness,nausea   Dairycare [Lactase-Lactobacillus]    Dust Mite Extract Itching    Sneezing & Itchiness In Eyes + pollen and grass. .   Other Itching and Other (See Comments)    Patient prefers to take liquid instead of tablets or capsule. Also pt states he is allergic to grass which causes itching & sneezing.   Pork-Derived Products Nausea Only    HOME MEDICATIONS: Outpatient Medications Prior to Visit  Medication Sig Dispense Refill   baclofen 5 MG TABS Take 5 mg by mouth daily as needed for muscle spasms. (Patient not taking: Reported on 02/24/2022) 15 tablet 0   No facility-administered medications prior to visit.    PAST MEDICAL HISTORY: Past Medical History:  Diagnosis Date   Allergy    Asthma    Chest pain    COVID-19 virus infection 02/08/2021   H. pylori infection    Prostatitis 05/29/2017   Puncture wound of right foot 02/28/2016    PAST SURGICAL HISTORY: Past Surgical History:  Procedure Laterality Date   COLONOSCOPY  2018   MOUTH SURGERY     ROOT CANAL      FAMILY HISTORY: Family History  Problem Relation Age of Onset   Kidney disease Father    Hypertension Paternal Grandmother    Heart disease Paternal Uncle    Diabetes Maternal Aunt    Colon cancer Neg Hx    Stomach cancer Neg Hx     SOCIAL HISTORY: Social History  Socioeconomic History   Marital status: Significant Other    Spouse name: Not on file   Number of children: 1   Years of education: Not on file   Highest education level: Not on file  Occupational History   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     History of incarceration in 2012.   Social Determinants of Health   Financial Resource Strain: Not on file  Food Insecurity: Not on file  Transportation Needs: Not on file  Physical Activity: Not on file  Stress: Not on file  Social Connections: Not on file  Intimate Partner Violence: Not on file    PHYSICAL EXAM  GENERAL EXAM/CONSTITUTIONAL: Vitals:  Vitals:   06/11/22 1333  BP: 117/75  Pulse: (!) 54  Weight: 195 lb (88.5 kg)  Height: 5' 9.6" (1.768 m)   Body mass index is 28.3 kg/m. Wt Readings from Last 3 Encounters:  06/11/22 195 lb (88.5 kg)  03/18/22 198 lb 3.2 oz (89.9 kg)  02/24/22 202 lb 8 oz (91.9 kg)   Patient is in no distress; well developed, nourished and groomed; neck is supple  EYES: Pupils round and reactive to light, Visual fields full to confrontation, Extraocular movements intacts,   MUSCULOSKELETAL: Gait, strength, tone, movements noted in Neurologic exam below  NEUROLOGIC: MENTAL STATUS:      No data to display         awake, alert, oriented to person, place and time recent and remote memory intact normal attention and concentration language fluent, comprehension intact, naming intact fund of knowledge appropriate  CRANIAL NERVE:  2nd, 3rd, 4th, 6th - pupils equal and reactive to light, visual fields full to confrontation, extraocular muscles intact, no nystagmus 5th - facial sensation symmetric 7th - facial strength symmetric 8th - hearing intact 9th - palate elevates symmetrically, uvula midline 11th - shoulder shrug symmetric 12th - tongue protrusion midline  MOTOR:  normal bulk and tone, full strength in the BUE, BLE  SENSORY:  normal and symmetric to light touch, pinprick, temperature, vibration  COORDINATION:  finger-nose-finger,  fine finger movements normal  REFLEXES:  deep tendon reflexes present and symmetric  GAIT/STATION:  normal   DIAGNOSTIC DATA (LABS, IMAGING, TESTING) - I reviewed patient records, labs, notes, testing and imaging myself where available.  Lab Results  Component Value Date   WBC 6.7 09/21/2018   HGB 15.4 09/21/2018   HCT 45.2 09/21/2018   MCV 97 09/21/2018   PLT 249 09/21/2018      Component Value Date/Time   NA 139 02/14/2022 1108   K 4.5 02/14/2022 1108   CL 99 02/14/2022 1108   CO2 26 02/14/2022 1108   GLUCOSE 93 02/14/2022 1108   GLUCOSE 101 (H) 09/28/2017 2127   BUN 14  02/14/2022 1108   CREATININE 1.01 02/14/2022 1108   CALCIUM 10.3 (H) 02/14/2022 1108   PROT 7.5 02/14/2022 1108   ALBUMIN 5.2 (H) 02/14/2022 1108   AST 25 02/14/2022 1108   ALT 40 02/14/2022 1108   ALKPHOS 68 02/14/2022 1108   BILITOT 0.6 02/14/2022 1108   GFRNONAA 108 09/21/2018 1124   GFRAA 125 09/21/2018 1124   No results found for: "CHOL", "HDL", "LDLCALC", "LDLDIRECT", "TRIG", "CHOLHDL" Lab Results  Component Value Date   HGBA1C 5.2 09/21/2018   Lab Results  Component Value Date   VITAMINB12 399 02/14/2022   Lab Results  Component Value Date   TSH 1.460 02/14/2022    MRI Brain 02/24/22 Unremarkable appearance of the brain.  Routine EEG 03/05/22 Normal    ASSESSMENT AND PLAN  36 y.o. year old male with history of bradycardia and asthma who is presenting for follow up of episodes of jerk like movements during sleep.  Due to ongoing episodes, still waking up with tongue biting and feeling tired, we will proceed with ambulatory EEG.  We will also treat him with Depakote 500 mg twice daily.  Advised the patient to continue monitoring these events and I will see him in 3 months for follow-up.  He voiced understanding.   1. Seizure-like activity ([place])     Patient Instructions  Start with Depakote 500 mg twice daily  Ambulatory EEG  Follow up in 3 months or sooner if worse  No  orders of the defined types were placed in this encounter.   Meds ordered this encounter  Medications   valproic acid (DEPAKENE) 250 MG/5ML solution    Sig: Take 10 mLs (500 mg total) by mouth 2 (two) times daily.    Dispense:  473 mL    Refill:  11    Return in about 3 months (around 09/11/2022).  I have spent a total of 30 minutes dedicated to this patient today, preparing to see patient, performing a medically appropriate examination and evaluation, ordering tests and/or medications and procedures, and counseling and educating the patient/family/caregiver; independently interpreting result and communicating results to the family/patient/caregiver; and documenting clinical information in the electronic medical record.    Alric Ran, MD 06/11/2022, 5:35 PM  [facility] [address], [place], [place] [address] [phone]

## 2022-06-16 ENCOUNTER — Telehealth: Payer: Self-pay

## 2022-06-16 NOTE — Telephone Encounter (Signed)
Ambulatory EEG order completed and faxed to [facility]. Confirmation received.

## 2022-06-17 NOTE — Telephone Encounter (Signed)
Pt said, the cost of the EEG was $3700 is too expensive. They suggestion can call [facility] back and discuss an alterative. Would like a call from the nurse.

## 2022-06-18 ENCOUNTER — Other Ambulatory Visit: Payer: Self-pay | Admitting: Neurology

## 2022-06-18 DIAGNOSIS — R569 Unspecified convulsions: Secondary | ICD-10-CM

## 2022-06-18 NOTE — Progress Notes (Signed)
Out of pocket amb eeg too expensive, will order a 2hrs eeg.

## 2022-06-18 NOTE — Telephone Encounter (Signed)
Lets order him a 2 hr sleep deprived EEG

## 2022-06-19 NOTE — Telephone Encounter (Signed)
Can you schedule pt 2 hr sleep deprived EEG?

## 2022-06-19 NOTE — Telephone Encounter (Signed)
Called and scheduled sleep deprived EEG with pt over the phone. He states the pharmacist told him that the Depakene he picked up would make him drowsy. He is hoping have an alternate medication or to only have to take it once per day instead of twice. Would like a call back from the nurse at [phone].

## 2022-06-23 NOTE — Telephone Encounter (Signed)
Mychart message sent to the pt on this.

## 2022-07-01 NOTE — Telephone Encounter (Signed)
Pt came by the office today.   He is requesting a change in his medication. He is taking Depakene 250 mg oral solution 500 mg bid. He would like to change to pill form and a dosage once per day instead of twice per day if available.  He reports the Depakene is causing fatigue/drowsiness for him.

## 2022-07-07 ENCOUNTER — Other Ambulatory Visit: Payer: Self-pay | Admitting: Neurology

## 2022-07-07 MED ORDER — DIVALPROEX SODIUM 125 MG PO CSDR: 500.0000 mg | DELAYED_RELEASE_CAPSULE | Freq: Two times a day (BID) | ORAL | 6 refills | Status: DC

## 2022-07-07 NOTE — Telephone Encounter (Signed)
I reviewed previous call routed to Dr. April Manson, I will forward this one as well.

## 2022-07-07 NOTE — Telephone Encounter (Signed)
I will try him on the depakote sprinkle.

## 2022-07-07 NOTE — Telephone Encounter (Addendum)
Pt has called back stating he has not heard from anyone on this request re: getting valproic acid (DEPAKENE) 250 MG/5ML solution , in pill form.  Pt states he has not been able to start the medication because of how the liquid would affect him working.  Please call with a response to this request from [phone] Please call do not respond via my chart, pt is constantly on the road driving

## 2022-07-07 NOTE — Progress Notes (Signed)
Unable to tolerate the liquid form and unable to swallow pill. We will try him on the sprinkle.

## 2022-07-08 NOTE — Telephone Encounter (Signed)
I called pt back and updated on Dr. Jabier Mutton recommendation.  He verbalized understanding/appreciation for the call.

## 2022-07-30 ENCOUNTER — Ambulatory Visit (INDEPENDENT_AMBULATORY_CARE_PROVIDER_SITE_OTHER): Payer: BC Managed Care – PPO | Admitting: Neurology

## 2022-07-30 DIAGNOSIS — R569 Unspecified convulsions: Secondary | ICD-10-CM

## 2022-08-12 ENCOUNTER — Other Ambulatory Visit: Payer: Self-pay | Admitting: Neurology

## 2022-08-12 NOTE — Procedures (Signed)
    History:  37 year old gentleman with seizure like activity  EEG classification:  Awake and asleep  Duration: 2:00:19  Description of the recording: The background rhythms of this recording consists of a fairly well modulated medium amplitude background activity of 9-10 Hz. As the record progresses, the patient initially is in the waking state, but appears to enter the early stage II sleep during the recording, with rudimentary sleep spindles and vertex sharp wave activity seen. During the wakeful state, photic stimulation is performed, and no abnormal responses were seen. Hyperventilation was also performed, no abnormal response seen. No epileptiform discharges seen during this recording. There was no focal slowing.   Abnormality: None   Impression: This is a normal sleep deprived EEG recording in the waking and sleeping state. No evidence of interictal epileptiform discharges. Normal EEGs, however, do not rule out epilepsy.    Alric Ran, MD [facility]

## 2022-08-13 ENCOUNTER — Ambulatory Visit: Payer: BC Managed Care – PPO | Admitting: Neurology

## 2022-08-29 IMAGING — CR DG CERVICAL SPINE COMPLETE 4+V
5 series · 5 of 5 positions shown · non-contrast
Comparison: None.

CLINICAL DATA: Radiculitis right stiffness pain

EXAM:
CERVICAL SPINE - COMPLETE 4+ VIEW

[w cervical spine lat]
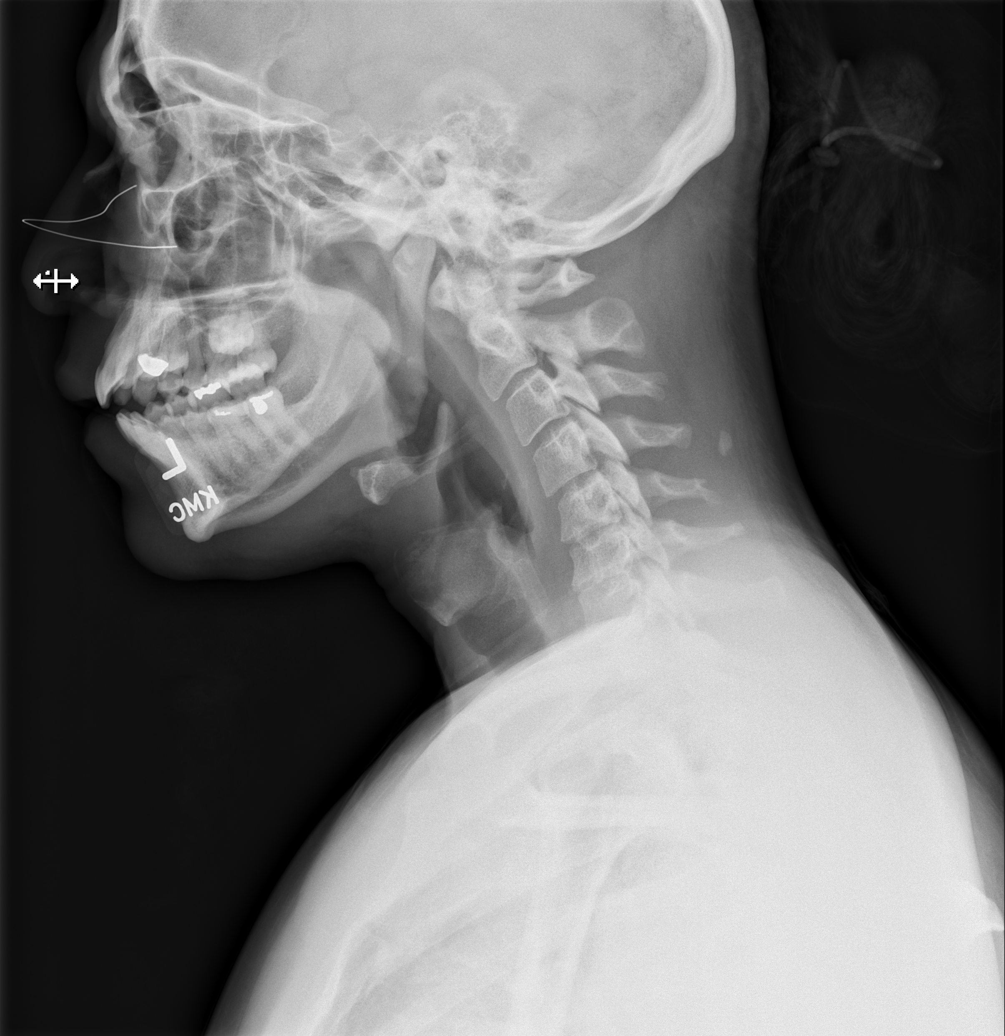

[w cervical spine ap_obl (1 of 2)]
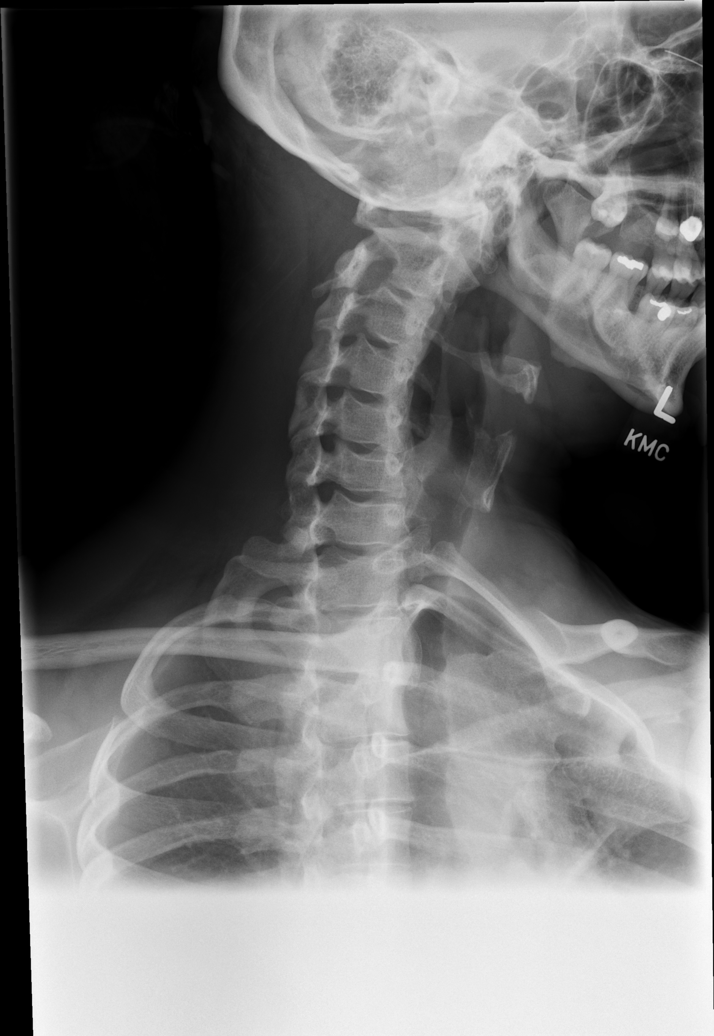

[w cervical spine ap_obl (2 of 2)]
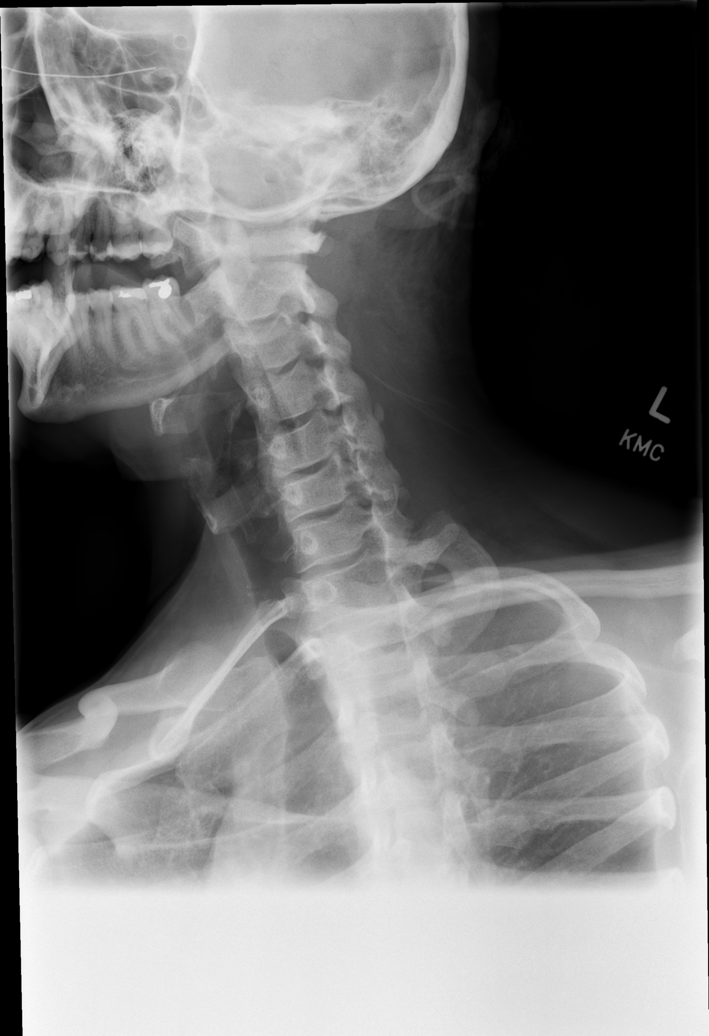

[w cervical spine ap]
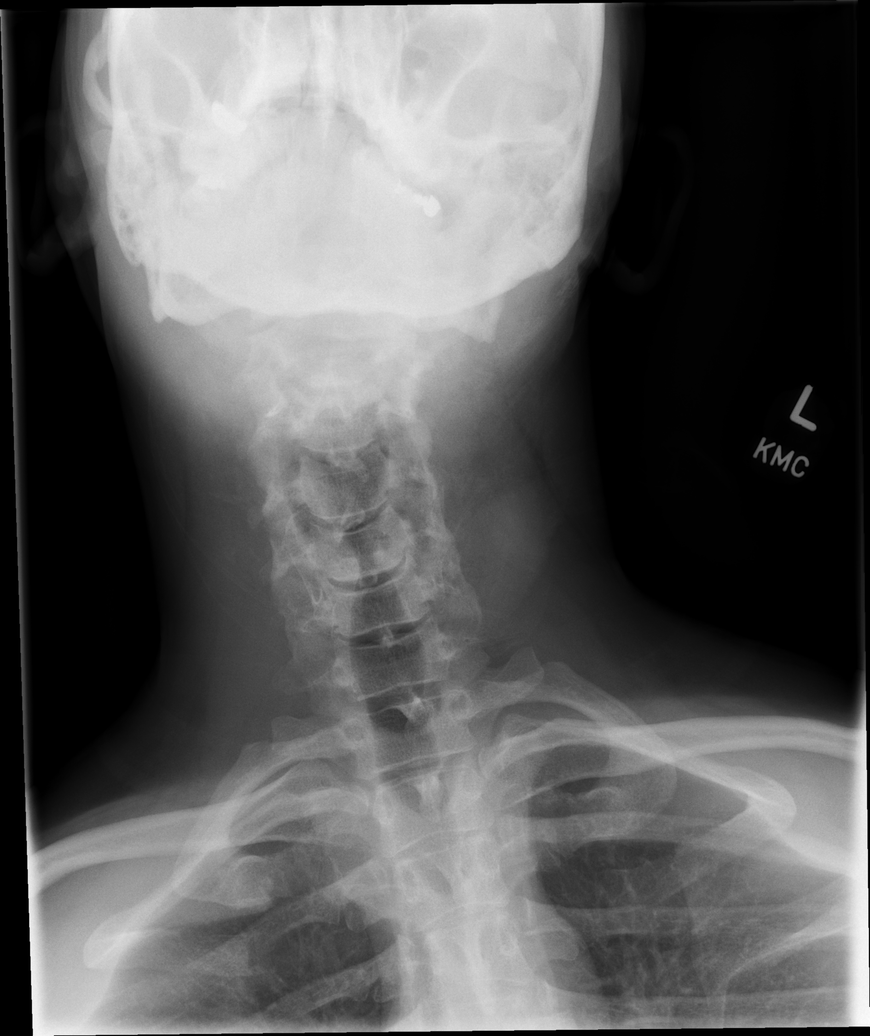

[w cervical swimmers]
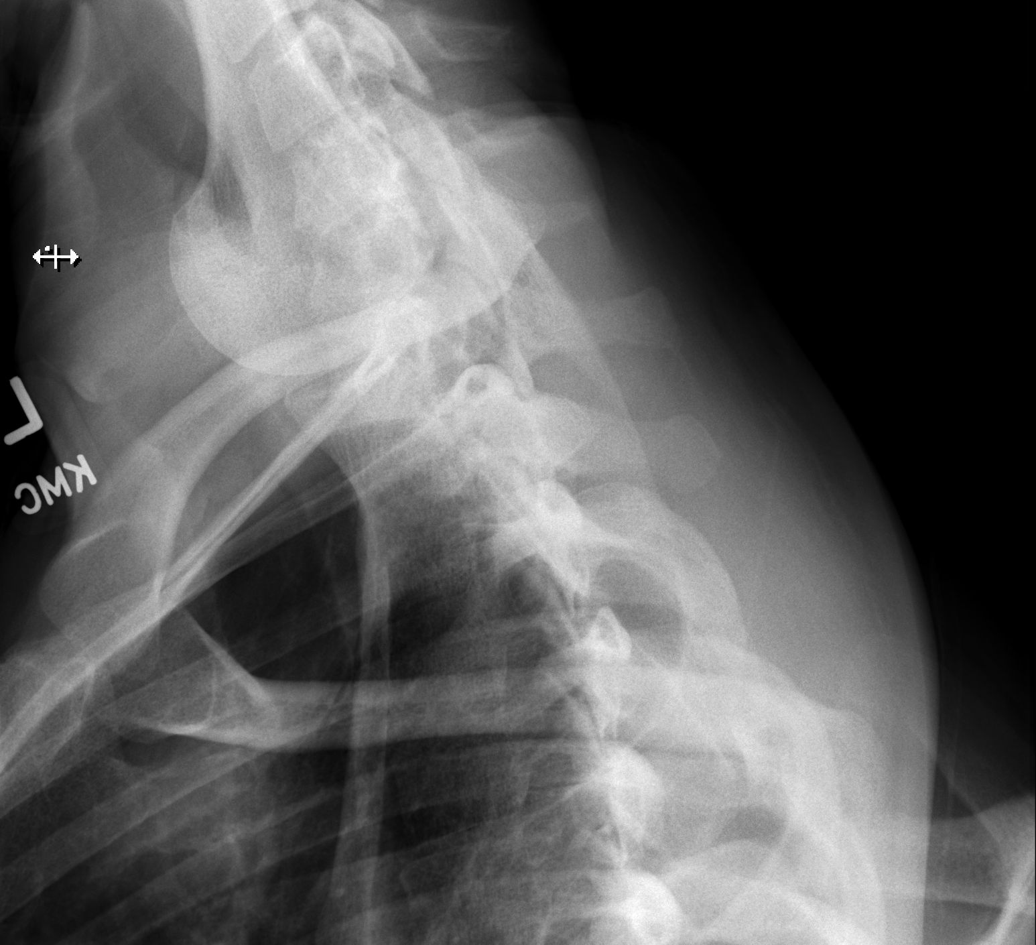

[5 of 5 positions shown; findings below may reference images not displayed]

FINDINGS: Reversal of cervical lordosis. Vertebral body heights are
maintained. Mild disc space narrowing C5-C6 and C6-C7. Normal
prevertebral soft tissue thickness
IMPRESSION: Reversal of cervical lordosis with degenerative changes C5 through
C7

## 2022-09-17 ENCOUNTER — Ambulatory Visit: Payer: BC Managed Care – PPO | Admitting: Neurology

## 2022-09-23 ENCOUNTER — Ambulatory Visit (INDEPENDENT_AMBULATORY_CARE_PROVIDER_SITE_OTHER): Payer: BC Managed Care – PPO | Admitting: Family Medicine

## 2022-09-23 ENCOUNTER — Encounter: Payer: Self-pay | Admitting: Family Medicine

## 2022-09-23 VITALS — BP 121/88 | HR 66 | Ht 69.5 in | Wt 196.8 lb

## 2022-09-23 DIAGNOSIS — R21 Rash and other nonspecific skin eruption: Secondary | ICD-10-CM | POA: Insufficient documentation

## 2022-09-23 DIAGNOSIS — L989 Disorder of the skin and subcutaneous tissue, unspecified: Secondary | ICD-10-CM | POA: Diagnosis not present

## 2022-09-23 MED ORDER — CLINDAMYCIN PHOS-BENZOYL PEROX 1-5 % EX GEL: Freq: Two times a day (BID) | CUTANEOUS | 0 refills | Status: DC

## 2022-09-23 MED ORDER — MUPIROCIN CALCIUM 2 % EX CREA: 1.0000 | TOPICAL_CREAM | Freq: Two times a day (BID) | CUTANEOUS | 0 refills | Status: DC

## 2022-09-23 NOTE — Assessment & Plan Note (Signed)
Differentials include herpes vs Impetigo Given hx of recurrent cold sore, HSV testing completed today. Trial of Bactroban. F/U soon if no improvement.

## 2022-09-23 NOTE — Addendum Note (Signed)
Addended by: Andrena Mews T on: 09/23/2022 02:16 PM   Modules accepted: Level of Service

## 2022-09-23 NOTE — Progress Notes (Signed)
    SUBJECTIVE:   CHIEF COMPLAINT / HPI:   HPI Facial and neck rash: C/O rash below his right lower lip and the nape of his neck. This has been ongoing x 1 month. He noticed the rash on his neck after a haircut at the barber's shop. He denies any pain, but the rash itches once in a while.The rash on his face started with a vesicle that then popped. He endorsed hx of multiple episodes of cold sores in the past.   Appetite: C/O poor appetite ongoing for a few weeks.   PERTINENT  PMH / PSH: PMHx reviewed  OBJECTIVE:   BP 121/88   Pulse 66   Ht 5' 9.5" (1.765 m)   Wt 196 lb 12.8 oz (89.3 kg)   SpO2 100%   BMI 28.65 kg/m   Physical Exam Vitals and nursing note reviewed.  Cardiovascular:     Rate and Rhythm: Normal rate and regular rhythm.     Heart sounds: Normal heart sounds. No murmur heard. Pulmonary:     Effort: Pulmonary effort is normal. No respiratory distress.     Breath sounds: Normal breath sounds. No wheezing.  Skin:    Comments: Dry, scaly, and scabing, mildly erythematous rash a few inches below his right lower lips, laterally.  One nodular, mildly erythematous lesion about 2-81m at the base of the right side of his skull. No signs of infection.       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 Benzaclin prescribed. Avoid excess low hair cut.  Facial rash Differentials include herpes vs Impetigo Given hx of recurrent cold sore, HSV testing completed today. Trial of Bactroban. F/U soon if no improvement.     KAndrena Mews MD [facility]

## 2022-09-23 NOTE — Patient Instructions (Signed)
Folliculitis  Folliculitis occurs when hair follicles become inflamed. A hair follicle is a tiny opening in your skin where your hair grows from. This condition often occurs on the scalp, thighs, legs, back, and buttocks but can happen anywhere on the body. What are the causes? A common cause of this condition is an infection from bacteria. The type of folliculitis caused by bacteria can last a long time or go away and come back. The bacteria can live anywhere on your skin. They are often found in the nostrils. Other causes may include: An infection from a fungus. An infection from a virus. Your skin touching some chemicals, such as oils and tars. Shaving or waxing. Greasy ointments or creams put on the skin. What increases the risk? You are more likely to develop this condition if: Your body has a weak disease-fighting system (immune system). You have diabetes. You are obese. What are the signs or symptoms? Symptoms of this condition include: Redness. Soreness. Swelling. Itching. Small white or yellow, itchy spots filled with pus (pustules) that appear over a red area. If the infection goes deep into the follicle, these may turn into a boil (furuncle). A group of boils (carbuncle). These tend to form in hairy, sweaty areas of the body. How is this diagnosed? This condition is diagnosed with a skin exam. Your health care provider may take a sample of one of the pustules or boils to test in a lab. How is this treated? This condition may be treated by: Putting a warm, wet cloth (warm compress) on the affected areas. Taking antibiotics or applying them to the skin. Applying or bathing with a solution that kills germs (antiseptic). Taking an over-the-counter medicine. This can help with itching. Having a procedure to drain pustules or boils. This may be done if a pustule or boil contains a lot of pus or fluid. Having laser hair removal. This may be done when the condition lasts for a  long time. Follow these instructions at home: Managing pain and swelling  If directed, apply heat to the affected area as often as told by your health care provider. Use the heat source that your health care provider recommends, such as a moist heat pack or a heating pad. Place a towel between your skin and the heat source. Leave the heat on for 20-30 minutes. If your skin turns bright red, remove the heat right away to prevent burns. The risk of burns is higher if you cannot feel pain, heat, or cold. General instructions Take over-the-counter and prescription medicines only as told by your health care provider. If you were prescribed antibiotics, take or apply them as told by your health care provider. Do not stop using the antibiotic even if you start to feel better. Check your irritated area every day for signs of infection. Check for: More redness, swelling, or pain. Fluid or blood. Warmth. Pus or a bad smell. Do not shave irritated skin. Keep all follow-up visits. Your health care provider will check if the treatments are helping. Contact a health care provider if: You have a fever. You have any signs of infection. Red streaks are spreading from the affected area. This information is not intended to replace advice given to you by your health care provider. Make sure you discuss any questions you have with your health care provider. Document Revised: 12/24/2021 Document Reviewed: 12/24/2021 Elsevier Patient Education  [place].

## 2022-09-23 NOTE — Assessment & Plan Note (Signed)
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 Benzaclin prescribed. Avoid excess low hair cut.

## 2022-09-24 ENCOUNTER — Telehealth: Payer: Self-pay | Admitting: Family Medicine

## 2022-09-24 DIAGNOSIS — B009 Herpesviral infection, unspecified: Secondary | ICD-10-CM

## 2022-09-24 LAB — HSV 1 ANTIBODY, IGG: HSV 1 Glycoprotein G Ab, IgG: 51.3 {index} — ABNORMAL HIGH (ref 0.00–0.90)

## 2022-09-24 LAB — HSV-2 AB, IGG: HSV 2 IgG, Type Spec: <0.91 {index} (ref 0.00–0.90)

## 2022-09-24 MED ORDER — ACYCLOVIR 400 MG PO TABS: 400.0000 mg | ORAL_TABLET | Freq: Three times a day (TID) | ORAL | 0 refills | Status: AC

## 2022-09-24 NOTE — Telephone Encounter (Signed)
+  HSV1 discussed. Acyclovir prescribed.  F/U as needed.  NB: He c/o new rash on his armpit. He'll take a picture and send it through [place]. Return precaution discussed.

## 2022-09-25 ENCOUNTER — Telehealth: Payer: Self-pay

## 2022-09-25 ENCOUNTER — Other Ambulatory Visit (HOSPITAL_COMMUNITY): Payer: Self-pay

## 2022-09-25 NOTE — Telephone Encounter (Signed)
A Prior Authorization was initiated & APPROVED for this patients BENZACLIN GEL through CoverMyMeds.   Key: [id number]

## 2022-09-25 NOTE — Telephone Encounter (Signed)
Prior Auth for patients medication Clindamycin Phos-Benzoyl Perox 1-5% gel approved by [employer] from 09/25/22 to 09/25/23.  CoverMyMeds Key: [id number] [place] Case ID #: [id number]

## 2022-10-09 ENCOUNTER — Ambulatory Visit (INDEPENDENT_AMBULATORY_CARE_PROVIDER_SITE_OTHER): Payer: BC Managed Care – PPO | Admitting: Neurology

## 2022-10-09 ENCOUNTER — Encounter: Payer: Self-pay | Admitting: Neurology

## 2022-10-09 VITALS — BP 123/67 | HR 61 | Ht 69.0 in | Wt 193.0 lb

## 2022-10-09 DIAGNOSIS — R569 Unspecified convulsions: Secondary | ICD-10-CM | POA: Diagnosis not present

## 2022-10-09 MED ORDER — LEVETIRACETAM ER 1000 MG PO TB24: 1000.0000 mg | ORAL_TABLET | Freq: Every evening | ORAL | 3 refills | Status: DC

## 2022-10-09 NOTE — Progress Notes (Signed)
[facility]  PATIENT: Michael Rogers DOB: January 05, 1986  REQUESTING CLINICIAN: [name], MD HISTORY FROM: Patient  REASON FOR VISIT: Seizure like activity    HISTORICAL  CHIEF COMPLAINT:  Chief Complaint  Patient presents with   Follow-up    RM 17 alone Pt is well, reports he hasn't had any tongue biting but two weeks ago he was extremely fatigued all week. No other concerns    INTERVAL HISTORY 10/09/2022:  Michael Rogers presents today for follow-up, last visit was in November, at that time we had planned to start him on Depakote 500 mg twice daily.  Due to possible side effects, he has not started the medicine.  He reports he has been doing well until 2 weeks ago when he woke up feeling very tired.  He does not recall biting his tongue. He has completed a sleep deprived EEG and it was normal.   INTERVAL HISTORY 06/11/22 Patient presents today for follow-up, last visit was in July, since then he reports that he has been sleeping alone, therefore he is unsure if he still having these events but he woke up a couple times with tongue biting and feeling tired the next day.  He denies any events during the daytime. He was not able to complete his ambulatory EEG, his routine EEG was normal.   HISTORY OF PRESENT ILLNESS:  This is a 37 year old gentleman past medical history of asthma and bradycardia who is presenting with seizure-like activity.  Patient reports episode of jerk like movement during sleep, stated that he does not remember them, he is not aware of it until wife tell him about the episodes.  I was able to spoke with wife over the phone, and she reports that he is having these episodes about once a month. These episodes usually involve the entire body,, wife reports these episodes will wake her up from sleep and last about 5 seconds.  Afterward, patient will go to sleep.  Patient reports at times he woke up with tongue biting, lateral side of his tongue and  sometimes he will wake up feeling very tired.  He has a history of dropping things in the morning, denies any staring spells.  He denies any previous history of seizures.   OTHER MEDICAL CONDITIONS: Asthma, Bradycardia    REVIEW OF SYSTEMS: Full 14 system review of systems performed and negative with exception of: as noted in the HPI   ALLERGIES: Allergies  Allergen Reactions   Amoxicillin     Other reaction(s): Dizziness Dizziness, lightheadedness,nausea   Dairycare [Lactase-Lactobacillus]    Dust Mite Extract Itching    Sneezing & Itchiness In Eyes + pollen and grass. .   Other Itching and Other (See Comments)    Patient prefers to take liquid instead of tablets or capsule. Also pt states he is allergic to grass which causes itching & sneezing.   Pork-Derived Products Nausea Only    HOME MEDICATIONS: Outpatient Medications Prior to Visit  Medication Sig Dispense Refill   clindamycin-benzoyl peroxide (BENZACLIN) gel Apply topically 2 (two) times daily. 25 g 0   mupirocin cream (BACTROBAN) 2 % Apply 1 Application topically 2 (two) times daily. Apply to the facial or lower lip rash 15 g 0   divalproex (DEPAKOTE SPRINKLES) 125 MG capsule Take 4 capsules (500 mg total) by mouth 2 (two) times daily. (Patient not taking: Reported on 09/23/2022) 240 capsule 6   No facility-administered medications prior to visit.    PAST MEDICAL HISTORY: Past Medical  History:  Diagnosis Date   Allergy    Asthma    Chest pain    COVID-19 virus infection 02/08/2021   H. pylori infection    Prostatitis 05/29/2017   Puncture wound of right foot 02/28/2016    PAST SURGICAL HISTORY: Past Surgical History:  Procedure Laterality Date   COLONOSCOPY  2018   MOUTH SURGERY     ROOT CANAL      FAMILY HISTORY: Family History  Problem Relation Age of Onset   Kidney disease Father    Hypertension Paternal Grandmother    Heart disease Paternal Uncle    Diabetes Maternal Aunt    Colon cancer Neg Hx     Stomach cancer Neg Hx     SOCIAL HISTORY: Social History   Socioeconomic History   Marital status: Significant Other    Spouse name: Not on file   Number of children: 1   Years of education: Not on file   Highest education level: Not on file  Occupational History   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     History of incarceration in 2012.   Social Determinants of Health   Financial Resource Strain: Not on file  Food Insecurity: Not on file  Transportation Needs: Not on file  Physical Activity: Not on file  Stress: Not on file  Social Connections: Not on file  Intimate Partner Violence: Not on file    PHYSICAL EXAM  GENERAL EXAM/CONSTITUTIONAL: Vitals:  Vitals:   10/09/22 1544  BP: 123/67  Pulse: 61  Weight: 193 lb (87.5 kg)  Height: '5\' 9"'$  (1.753 m)    Body mass index is 28.5 kg/m. Wt Readings from Last 3 Encounters:  10/09/22 193 lb (87.5 kg)  09/23/22 196 lb 12.8 oz (89.3 kg)  06/11/22 195 lb (88.5 kg)   Patient is in no distress; well developed, nourished and groomed; neck is supple  EYES: Visual fields full to confrontation, Extraocular movements intacts,   MUSCULOSKELETAL: Gait, strength, tone, movements noted in Neurologic exam below  NEUROLOGIC: MENTAL STATUS:      No data to display         awake, alert, oriented to person, place and time recent and remote memory intact normal attention and  concentration language fluent, comprehension intact, naming intact fund of knowledge appropriate  CRANIAL NERVE:  2nd, 3rd, 4th, 6th - visual fields full to confrontation, extraocular muscles intact, no nystagmus 5th - facial sensation symmetric 7th - facial strength symmetric 8th - hearing intact 9th - palate elevates symmetrically, uvula midline 11th - shoulder shrug symmetric 12th - tongue protrusion midline  MOTOR:  normal bulk and tone, full strength in the BUE, BLE  SENSORY:  normal and symmetric to light touch, pinprick, temperature, vibration  COORDINATION:  finger-nose-finger,  fine finger movements normal  REFLEXES:  deep tendon reflexes present and symmetric  GAIT/STATION:  normal   DIAGNOSTIC DATA (LABS, IMAGING, TESTING) - I reviewed patient records, labs, notes, testing and imaging myself where available.  Lab Results  Component Value Date   WBC 6.7 09/21/2018   HGB 15.4 09/21/2018   HCT 45.2 09/21/2018   MCV 97 09/21/2018   PLT 249 09/21/2018      Component Value Date/Time   NA 139 02/14/2022 1108   K 4.5 02/14/2022 1108   CL 99 02/14/2022 1108   CO2 26 02/14/2022 1108   GLUCOSE 93 02/14/2022 1108   GLUCOSE 101 (H) 09/28/2017 2127   BUN 14 02/14/2022 1108   CREATININE 1.01 02/14/2022 1108   CALCIUM 10.3 (H) 02/14/2022 1108   PROT 7.5 02/14/2022 1108   ALBUMIN 5.2 (H) 02/14/2022 1108   AST 25 02/14/2022 1108   ALT 40 02/14/2022 1108   ALKPHOS 68 02/14/2022 1108   BILITOT 0.6 02/14/2022 1108   GFRNONAA 108 09/21/2018 1124   GFRAA 125 09/21/2018 1124   No results found for: "CHOL", "HDL", "LDLCALC", "LDLDIRECT", "TRIG", "CHOLHDL" Lab Results  Component Value Date   HGBA1C 5.2 09/21/2018   Lab Results  Component Value Date   VITAMINB12 399 02/14/2022   Lab Results  Component Value Date   TSH 1.460 02/14/2022    MRI Brain 02/24/22 Unremarkable appearance of the brain.  Routine EEG 03/05/22 Normal   Sleep deprived EEG 07/30/22 This  is a normal sleep deprived EEG recording in the waking and sleeping state. No evidence of interictal epileptiform discharges. Normal EEGs, however, do not rule out epilepsy.   ASSESSMENT AND PLAN  37 y.o. year old male with history of bradycardia and asthma who is presenting for follow up of episodes of jerk like movements during sleep.  Due to ongoing episodes, still waking up with tongue biting and feeling tired, will start him on medication.  We have tried Depakote but he did not feel comfortable taking medication twice a day with his current job.  We have opted to give him extended release Keppra 1000 mg nightly, hopefully he can start the medication today.  I did advise him to contact me if you have any concerns about the medicine or if he has any additional events.  He voices understanding.  I will see him in 3 months for follow-up.   1. Seizure-like activity ([place])      Patient Instructions  Discontinue Depakote Start levetiracetam extended release 1000 mg nightly Contact us for any other questions or concerns Follow-up in 3 months or sooner if worse.  No orders of the defined types were placed in this encounter.   Meds ordered this encounter  Medications   levETIRAcetam ER 1000 MG TB24    Sig: Take 1,000 mg by mouth at bedtime.    Dispense:  30 tablet    Refill:  3    Return in about 3 months (around 01/09/2023).     Alric Ran, MD 10/09/2022, 4:11 PM  [facility] [address] [place], [place] [address] [phone]

## 2022-10-09 NOTE — Patient Instructions (Signed)
Discontinue Depakote Start levetiracetam extended release 1000 mg nightly Contact us for any other questions or concerns Follow-up in 3 months or sooner if worse.

## 2022-10-15 ENCOUNTER — Other Ambulatory Visit: Payer: Self-pay | Admitting: Neurology

## 2022-10-15 ENCOUNTER — Telehealth: Payer: Self-pay | Admitting: Neurology

## 2022-10-15 MED ORDER — CLOBAZAM 10 MG PO TABS: 10.0000 mg | ORAL_TABLET | Freq: Every evening | ORAL | 5 refills | Status: DC

## 2022-10-15 NOTE — Telephone Encounter (Signed)
Pt called stated he thinks he may need a PA for medication  levETIRAcetam ER 1000 MG TB24. Pt is requesting someone follow-up with  [facility] #[id number]-  To see what's going on with his medication. Pt is requesting a call back from nurse.

## 2022-10-15 NOTE — Telephone Encounter (Signed)
Contacted pharmacy, spoke to [place].  He stated PA was needed, I questioned if they had medication in stock prior to me attempting PA due to another pt stating no pharmacy he called could get it. He checked stock and vendor and it is on back order with no release date. Multiple pharmacies have said the same, would you like to prescribe something else? Please advise

## 2022-10-15 NOTE — Telephone Encounter (Signed)
Pt confirmed he got the nurses message.

## 2022-10-15 NOTE — Progress Notes (Signed)
Keppra XR not available in back order, will try him on Clobazam

## 2022-10-15 NOTE — Telephone Encounter (Signed)
Contacted pt again, phone went straight to VM.

## 2022-10-15 NOTE — Telephone Encounter (Signed)
Contacted pt, LVM rq call back  

## 2022-10-15 NOTE — Telephone Encounter (Signed)
We can try him on Clobazam. Please advise patient to take 1/2 tablet for one week then increase to full tablet nightly.

## 2022-10-24 ENCOUNTER — Telehealth: Payer: Self-pay

## 2022-10-24 NOTE — Telephone Encounter (Signed)
Patient calls nurse line regarding burning sensation in abdomen for the last 3 days. He denies vomiting, fever or diarrhea. Denies rectal bleeding, however, reports that he has history of anal fissure and he will sometime see a "trace" amount of blood in stool.   He has seen GI in the past.   Offered appointment with PCP today for further evaluation. He is unable to come in today. Scheduled for Tuesday, 3/26. Also advised that he contact GI. (He is unsure if he is still an established patient)  Provided with strict ED precautions.   Talbot Grumbling, RN

## 2022-10-28 ENCOUNTER — Ambulatory Visit: Payer: BC Managed Care – PPO | Admitting: Family Medicine

## 2022-11-04 ENCOUNTER — Telehealth: Payer: Self-pay | Admitting: Neurology

## 2022-11-04 ENCOUNTER — Ambulatory Visit: Payer: BC Managed Care – PPO | Admitting: Family Medicine

## 2022-11-04 NOTE — Telephone Encounter (Signed)
Pt called. Stated he is having seizures in his sleep. Stated he is having two to three seizures a night. Stated they are lasting for about a minutes, stated he is having headaches afterward. Stated his wife is keeping up with the seizure at night

## 2022-11-05 ENCOUNTER — Other Ambulatory Visit: Payer: Self-pay | Admitting: Neurology

## 2022-11-05 DIAGNOSIS — R569 Unspecified convulsions: Secondary | ICD-10-CM

## 2022-11-05 MED ORDER — CLOBAZAM 10 MG PO TABS: 10.0000 mg | ORAL_TABLET | Freq: Two times a day (BID) | ORAL | 5 refills | Status: DC

## 2022-11-05 MED ORDER — LEVETIRACETAM 500 MG PO TABS: 500.0000 mg | ORAL_TABLET | Freq: Two times a day (BID) | ORAL | 6 refills | Status: DC

## 2022-11-05 NOTE — Telephone Encounter (Signed)
Please clarify if patient is taking the Clobazam. We could not get him Keppra XR

## 2022-11-05 NOTE — Telephone Encounter (Signed)
I sent the EMU referral to the coordinator at [facility]. Her name is Lala Lund phone number [phone]

## 2022-11-05 NOTE — Telephone Encounter (Signed)
Please advise patient to increase the Clobazam to 10 mg twice daily. I will send him a new prescription and also refer him to EMU for capture and characterization.

## 2022-11-05 NOTE — Telephone Encounter (Signed)
Called patient states he has been taking the medication as prescribed for almost a month now, he is almost due for his first refill. States he has had a headache off and on for the last two weeks and last noted seizure was Saturday night. Denies missing any doses and states he does not know when he will have seizure.

## 2022-11-05 NOTE — Telephone Encounter (Signed)
He should not be driving until we complete all the work up. Have him continue with Clobazam 10 mg nightly

## 2022-11-05 NOTE — Telephone Encounter (Signed)
He was agreeable to no driving and the medication. Pt verbalized understanding. Pt had no questions at this time but was encouraged to call back if questions arise.

## 2022-12-22 ENCOUNTER — Telehealth: Payer: Self-pay | Admitting: Neurology

## 2022-12-22 ENCOUNTER — Other Ambulatory Visit: Payer: Self-pay | Admitting: Neurology

## 2022-12-22 MED ORDER — LEVETIRACETAM ER 1000 MG PO TB24: 1000.0000 mg | ORAL_TABLET | Freq: Every day | ORAL | 6 refills | Status: DC

## 2022-12-22 NOTE — Telephone Encounter (Signed)
Call to patient in regards to him thinking he had a seizure last night, he woke up and realized he had urinated himself. He also states that he had a bed headache earlier today. I asked if he had missed any medication doses and he stated no and I conformed what medications he was taking and he said the "clo one" and he is only taking at night.  I asked about the keppra and he has not been taking that, saying the pharmacy didn't have it. He is worried about it making him drowsy. Advised on 6 months no driving from last noted seizure. He verbalized understanding but stated he needed to work. Advised I would let Dr. Teresa Coombs be aware and follow up with recommendations. Patient appreciative of call.

## 2022-12-22 NOTE — Telephone Encounter (Signed)
Please advise patient that I have started him on Keppra XR 1000 mg daily. This is a new medication that he has to take in the morning with his Clobazam in the evening.

## 2022-12-22 NOTE — Telephone Encounter (Signed)
Pt stated he think he had a seizure lat night. Stated he urinated all over his self last night. Stated he really don't know what happened. He is requesting a call back from nurse to discuss.

## 2022-12-22 NOTE — Progress Notes (Signed)
Will try him on Levetiracetam XR 1000 mg. Pharmacy didn't have the medication previously.

## 2022-12-23 NOTE — Telephone Encounter (Signed)
Call to patient to make aware of Keppra XL 1000 mg daily in the morning and continue clobazam nightly. Patient states he is concerned about it making , him sleepy. Advised it ws extended release. He states he will pick up and will start it this weekend. Reviewed Dr. Teresa Coombs recommendation of starting today and patient verbalized understanding but still will wait until the weekend. He verbalized understanding for risk of seizures. Encourage patient to call with any changes and reminded him of upcoming June appointment. Patient appreciative of call.

## 2022-12-27 ENCOUNTER — Telehealth: Payer: Self-pay

## 2022-12-27 ENCOUNTER — Other Ambulatory Visit (HOSPITAL_COMMUNITY): Payer: Self-pay

## 2022-12-27 NOTE — Telephone Encounter (Signed)
OK to switch to Levetiracetam 1000 mg IS. Thank you

## 2022-12-27 NOTE — Telephone Encounter (Signed)
Patient Advocate Encounter   Received notification from [facility] [place] that prior authorization is required for Elepsia XR 1000MG  er tablets   Submitted: n/a Raquel Sarna  PA not submitted. Awaiting response from office.

## 2022-12-30 NOTE — Telephone Encounter (Signed)
Left msg to call back 1st attempt. Wanted to let him know take keppra instead

## 2022-12-31 NOTE — Telephone Encounter (Signed)
Called patient and relayed message and states he will be picking it up soon. Pt had no questions at this time but was encouraged to call back if questions arise. Pt verbalized understanding.

## 2023-01-22 ENCOUNTER — Encounter: Payer: Self-pay | Admitting: Neurology

## 2023-01-22 ENCOUNTER — Ambulatory Visit (INDEPENDENT_AMBULATORY_CARE_PROVIDER_SITE_OTHER): Payer: BC Managed Care – PPO | Admitting: Neurology

## 2023-01-22 VITALS — BP 137/82 | HR 63 | Ht 69.0 in | Wt 187.0 lb

## 2023-01-22 DIAGNOSIS — R569 Unspecified convulsions: Secondary | ICD-10-CM | POA: Diagnosis not present

## 2023-01-22 NOTE — Patient Instructions (Signed)
Continue with Clobazam 10 mg nightly  Proceed with EMU admission  Follow up in 6 months or sooner if worse

## 2023-01-22 NOTE — Progress Notes (Signed)
[facility]  PATIENT: Michael Rogers DOB: 1986-07-08  REQUESTING CLINICIAN: [name], MD HISTORY FROM: Patient  REASON FOR VISIT: Seizure like activity    HISTORICAL  CHIEF COMPLAINT:  Chief Complaint  Patient presents with   Follow-up    Rm 13. Patient alone, reports he doesn't know that he has any sz because they are night but did wake up one morning a few weeks ago and had urinated on himself. He believes he may have had one then   INTERVAL HISTORY 01/22/2023:  Patient presents today for follow-up, he is alone.  Last visit was in March and since then he has been doing okay.  His Keppra was not approved by insurance, and the clobazam 10 mg twice daily has caused side effect of somnolence.  Currently he is on clobazam 10 mg nightly, denies any additional side effects, and report 3 weeks ago he woke up with urinary incontinence and that he does not have any other event suspicious for seizures.  He is compliant with his medication.   The EMU had contacted him to schedule the appointment but due to work restriction he was not able to get admitted.  He is planning to do the EMU admission in July    INTERVAL HISTORY 10/09/2022:  Michael Rogers presents today for follow-up, last visit was in November, at that time we had planned to start him on Depakote 500 mg twice daily.  Due to possible side effects, he has not started the medicine.  He reports he has been doing well until 2 weeks ago when he woke up feeling very tired.  He does not recall biting his tongue. He has completed a sleep deprived EEG and it was normal.   INTERVAL HISTORY 06/11/22 Patient presents today for follow-up, last visit was in July, since then he reports that he has been sleeping alone, therefore he is unsure if he still having these events but he woke up a couple times with tongue biting and feeling tired the next day.  He denies any events during the daytime. He was not able to complete his  ambulatory EEG, his routine EEG was normal.   HISTORY OF PRESENT ILLNESS:  This is a 37 year old gentleman past medical history of asthma and bradycardia who is presenting with seizure-like activity.  Patient reports episode of jerk like movement during sleep, stated that he does not remember them, he is not aware of it until wife tell him about the episodes.  I was able to spoke with wife over the phone, and she reports that he is having these episodes about once a month. These episodes usually involve the entire body,, wife reports these episodes will wake her up from sleep and last about 5 seconds.  Afterward, patient will go to sleep.  Patient reports at times he woke up with tongue biting, lateral side of his tongue and sometimes he will wake up feeling very tired.  He has a history of dropping things in the morning, denies any staring spells.  He denies any previous history of seizures.   OTHER MEDICAL CONDITIONS: Asthma, Bradycardia    REVIEW OF SYSTEMS: Full 14 system review of systems performed and negative with exception of: as noted in the HPI   ALLERGIES: Allergies  Allergen Reactions   Amoxicillin     Other reaction(s): Dizziness Dizziness, lightheadedness,nausea   Dairycare [Lactase-Lactobacillus]    Dust Mite Extract Itching    Sneezing & Itchiness In Eyes + pollen and grass. Marland Kitchen  Other Itching and Other (See Comments)    Patient prefers to take liquid instead of tablets or capsule. Also pt states he is allergic to grass which causes itching & sneezing.   Pork-Derived Products Nausea Only    HOME MEDICATIONS: Outpatient Medications Prior to Visit  Medication Sig Dispense Refill   cloBAZam (ONFI) 10 MG tablet Take 1 tablet (10 mg total) by mouth 2 (two) times daily. 30 tablet 5   clindamycin-benzoyl peroxide (BENZACLIN) gel Apply topically 2 (two) times daily. (Patient not taking: Reported on 01/22/2023) 25 g 0   levETIRAcetam ER 1000 MG TB24 Take 1,000 mg by mouth daily.  30 tablet 6   mupirocin cream (BACTROBAN) 2 % Apply 1 Application topically 2 (two) times daily. Apply to the facial or lower lip rash (Patient not taking: Reported on 01/22/2023) 15 g 0   No facility-administered medications prior to visit.    PAST MEDICAL HISTORY: Past Medical History:  Diagnosis Date   Allergy    Asthma    Chest pain    COVID-19 virus infection 02/08/2021   H. pylori infection    Prostatitis 05/29/2017   Puncture wound of right foot 02/28/2016    PAST SURGICAL HISTORY: Past Surgical History:  Procedure Laterality Date   COLONOSCOPY  2018   MOUTH SURGERY     ROOT CANAL      FAMILY HISTORY: Family History  Problem Relation Age of Onset   Kidney disease Father    Hypertension Paternal Grandmother    Heart disease Paternal Uncle    Diabetes Maternal Aunt    Colon cancer Neg Hx    Stomach cancer Neg Hx     SOCIAL HISTORY: Social History   Socioeconomic History   Marital status: Significant Other    Spouse name: Not on file   Number of children: 1   Years of education: Not on file   Highest education level: Not on file  Occupational History   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     History of incarceration in 2012.   Social Determinants of Health   Financial Resource Strain: Not on file  Food Insecurity: Not on file  Transportation Needs: Not on file  Physical Activity:  Not on file  Stress: Not on file  Social Connections: Not on file  Intimate Partner Violence: Not on file    PHYSICAL EXAM  GENERAL EXAM/CONSTITUTIONAL: Vitals:  Vitals:   01/22/23 1539  BP: 137/82  Pulse: 63  Weight: 187 lb (84.8 kg)  Height: 5\' 9"  (1.753 m)    Body mass index is 27.62 kg/m. Wt Readings from Last 3 Encounters:  01/22/23 187 lb (84.8 kg)  10/09/22 193 lb (87.5 kg)  09/23/22 196 lb 12.8  oz (89.3 kg)   Patient is in no distress; well developed, nourished and groomed; neck is supple  MUSCULOSKELETAL: Gait, strength, tone, movements noted in Neurologic exam below  NEUROLOGIC: MENTAL STATUS:      No data to display         awake, alert, oriented to person, place and time recent and remote memory intact normal attention and concentration language fluent, comprehension intact, naming intact fund of knowledge appropriate  CRANIAL NERVE:  2nd, 3rd, 4th, 6th - visual fields full to confrontation, extraocular muscles intact, no nystagmus 5th - facial sensation symmetric 7th - facial strength symmetric 8th - hearing intact 9th - palate elevates symmetrically, uvula midline 11th - shoulder shrug symmetric 12th - tongue protrusion midline  MOTOR:  normal bulk and tone, full strength in the BUE, BLE  SENSORY:  normal and symmetric to light touch, pinprick, temperature, vibration  COORDINATION:  finger-nose-finger, fine finger movements normal  REFLEXES:  deep tendon reflexes present and symmetric  GAIT/STATION:  normal   DIAGNOSTIC DATA (LABS, IMAGING, TESTING) - I reviewed patient records, labs, notes, testing and imaging myself where available.  Lab Results  Component Value Date   WBC 6.7 09/21/2018   HGB 15.4 09/21/2018   HCT 45.2 09/21/2018   MCV 97 09/21/2018   PLT 249 09/21/2018      Component Value Date/Time   NA 139 02/14/2022 1108   K 4.5 02/14/2022 1108   CL 99 02/14/2022 1108   CO2 26 02/14/2022 1108   GLUCOSE 93  02/14/2022 1108   GLUCOSE 101 (H) 09/28/2017 2127   BUN 14 02/14/2022 1108   CREATININE 1.01 02/14/2022 1108   CALCIUM 10.3 (H) 02/14/2022 1108   PROT 7.5 02/14/2022 1108   ALBUMIN 5.2 (H) 02/14/2022 1108   AST 25 02/14/2022 1108   ALT 40 02/14/2022 1108   ALKPHOS 68 02/14/2022 1108   BILITOT 0.6 02/14/2022 1108   GFRNONAA 108 09/21/2018 1124   GFRAA 125 09/21/2018 1124   No results found for: "CHOL", "HDL", "LDLCALC", "LDLDIRECT", "TRIG", "CHOLHDL" Lab Results  Component Value Date   HGBA1C 5.2 09/21/2018   Lab Results  Component Value Date   VITAMINB12 399 02/14/2022   Lab Results  Component Value Date   TSH 1.460 02/14/2022    MRI Brain 02/24/22 Unremarkable appearance of the brain.  Routine EEG 03/05/22 Normal   Sleep deprived EEG 07/30/22 This is a normal sleep deprived EEG recording in the waking and sleeping state. No evidence of interictal epileptiform discharges. Normal EEGs, however, do not rule out epilepsy.   ASSESSMENT AND PLAN  37 y.o. year old male with history of bradycardia and asthma who is presenting for follow up of episodes of jerk like movements, urinary incontinence and tongue biting during sleep.  Due to ongoing episodes, still waking up with tongue biting and feeling tired, will start him on medication.  We have tried Depakote but he did not feel comfortable taking medication twice a day with his current job.  Extended release Keppra was not approved by insurance. Currently he is on Clobazam 10 mg nightly. Will continue patient on Clobazam and strongly advise him to move forward with the EMU admission schedule for July. I will see him in 6 months for follow up or sooner if worse.     1. Seizure-like activity (HCC)       Patient Instructions  Continue with Clobazam 10 mg nightly  Proceed with EMU admission  Follow up in 6 months or sooner if  worse   No orders of the defined types were placed in this encounter.   No orders of the defined  types were placed in this encounter.   Return in about 6 months (around 07/24/2023).     Windell Norfolk, MD 01/22/2023, 4:31 PM  [facility] [address], [address] [place], [place] [address] [phone]

## 2023-03-23 ENCOUNTER — Inpatient Hospital Stay (HOSPITAL_COMMUNITY)
Admission: RE | Admit: 2023-03-23 | Discharge: 2023-03-27 | DRG: 093 | Disposition: A | Discharge status: Home / Self Care | Payer: BC Managed Care – PPO | Attending: Neurology | Admitting: Neurology

## 2023-03-23 ENCOUNTER — Other Ambulatory Visit: Payer: Self-pay

## 2023-03-23 ENCOUNTER — Inpatient Hospital Stay (HOSPITAL_COMMUNITY): Payer: BC Managed Care – PPO

## 2023-03-23 ENCOUNTER — Encounter (HOSPITAL_COMMUNITY): Payer: Self-pay | Admitting: Neurology

## 2023-03-23 DIAGNOSIS — Z8616 Personal history of COVID-19: Secondary | ICD-10-CM | POA: Diagnosis not present

## 2023-03-23 DIAGNOSIS — Z841 Family history of disorders of kidney and ureter: Secondary | ICD-10-CM

## 2023-03-23 DIAGNOSIS — Z833 Family history of diabetes mellitus: Secondary | ICD-10-CM | POA: Diagnosis not present

## 2023-03-23 DIAGNOSIS — G253 Myoclonus: Secondary | ICD-10-CM | POA: Diagnosis not present

## 2023-03-23 DIAGNOSIS — Z8249 Family history of ischemic heart disease and other diseases of the circulatory system: Secondary | ICD-10-CM

## 2023-03-23 DIAGNOSIS — R001 Bradycardia, unspecified: Secondary | ICD-10-CM | POA: Diagnosis not present

## 2023-03-23 DIAGNOSIS — Z87891 Personal history of nicotine dependence: Secondary | ICD-10-CM | POA: Diagnosis not present

## 2023-03-23 DIAGNOSIS — R569 Unspecified convulsions: Principal | ICD-10-CM

## 2023-03-23 DIAGNOSIS — Z79899 Other long term (current) drug therapy: Secondary | ICD-10-CM | POA: Diagnosis not present

## 2023-03-23 DIAGNOSIS — Z87898 Personal history of other specified conditions: Secondary | ICD-10-CM

## 2023-03-23 LAB — CBC WITH DIFFERENTIAL/PLATELET
Abs Immature Granulocytes: 0.02 10*3/uL (ref 0.00–0.07)
Basophils Absolute: 0.1 10*3/uL (ref 0.0–0.1)
Basophils Relative: 1 %
Eosinophils Absolute: 0.2 10*3/uL (ref 0.0–0.5)
Eosinophils Relative: 3 %
HCT: 41.4 % (ref 39.0–52.0)
Hemoglobin: 14.2 g/dL (ref 13.0–17.0)
Immature Granulocytes: 0 %
Lymphocytes Relative: 46 %
Lymphs Abs: 2.6 10*3/uL (ref 0.7–4.0)
MCH: 34.4 pg — ABNORMAL HIGH (ref 26.0–34.0)
MCHC: 34.3 g/dL (ref 30.0–36.0)
MCV: 100.2 fL — ABNORMAL HIGH (ref 80.0–100.0)
Monocytes Absolute: 0.4 10*3/uL (ref 0.1–1.0)
Monocytes Relative: 6 %
Neutro Abs: 2.5 10*3/uL (ref 1.7–7.7)
Neutrophils Relative %: 44 %
Platelets: 206 10*3/uL (ref 150–400)
RBC: 4.13 MIL/uL — ABNORMAL LOW (ref 4.22–5.81)
RDW: 12.8 % (ref 11.5–15.5)
WBC: 5.7 10*3/uL (ref 4.0–10.5)
nRBC: 0 % (ref 0.0–0.2)

## 2023-03-23 LAB — RAPID URINE DRUG SCREEN, HOSP PERFORMED
Amphetamines: NOT DETECTED
Barbiturates: NOT DETECTED
Benzodiazepines: POSITIVE — AB
Cocaine: NOT DETECTED
Opiates: NOT DETECTED
Tetrahydrocannabinol: POSITIVE — AB

## 2023-03-23 LAB — COMPREHENSIVE METABOLIC PANEL
ALT: 22 U/L (ref 0–44)
AST: 18 U/L (ref 15–41)
Albumin: 3.8 g/dL (ref 3.5–5.0)
Alkaline Phosphatase: 46 U/L (ref 38–126)
Anion gap: 7 (ref 5–15)
BUN: 8 mg/dL (ref 6–20)
CO2: 27 mmol/L (ref 22–32)
Calcium: 8.9 mg/dL (ref 8.9–10.3)
Chloride: 103 mmol/L (ref 98–111)
Creatinine, Ser: 0.86 mg/dL (ref 0.61–1.24)
GFR, Estimated: >60 mL/min (ref >60)
Glucose, Bld: 87 mg/dL (ref 70–99)
Potassium: 3.8 mmol/L (ref 3.5–5.1)
Sodium: 137 mmol/L (ref 135–145)
Total Bilirubin: 0.3 mg/dL (ref 0.3–1.2)
Total Protein: 6.1 g/dL — ABNORMAL LOW (ref 6.5–8.1)

## 2023-03-23 LAB — MAGNESIUM: Magnesium: 1.9 mg/dL (ref 1.7–2.4)

## 2023-03-23 LAB — PROTIME-INR
INR: 1.2 (ref 0.8–1.2)
Prothrombin Time: 15.2 seconds (ref 11.4–15.2)

## 2023-03-23 LAB — PHOSPHORUS: Phosphorus: 3.1 mg/dL (ref 2.5–4.6)

## 2023-03-23 LAB — HIV ANTIBODY (ROUTINE TESTING W REFLEX): HIV Screen 4th Generation wRfx: NONREACTIVE

## 2023-03-23 MED ORDER — ACETAMINOPHEN 650 MG RE SUPP: 650.0000 mg | RECTAL | Status: DC | PRN

## 2023-03-23 MED ORDER — ACETAMINOPHEN 325 MG PO TABS: 650.0000 mg | ORAL_TABLET | ORAL | Status: DC | PRN

## 2023-03-23 MED ORDER — SODIUM CHLORIDE 0.9% FLUSH
3.0000 mL | Freq: Two times a day (BID) | INTRAVENOUS | Status: DC
  Administered 2023-03-23 – 2023-03-26 (×8): 3 mL via INTRAVENOUS

## 2023-03-23 MED ORDER — MIDAZOLAM HCL 2 MG/2ML IJ SOLN: 2.0000 mg | INTRAMUSCULAR | Status: DC | PRN

## 2023-03-23 MED ORDER — LABETALOL HCL 5 MG/ML IV SOLN: 5.0000 mg | INTRAVENOUS | Status: DC | PRN

## 2023-03-23 NOTE — Progress Notes (Signed)
EMU LTM EEG hooked up and running - no initial skin breakdown - push button tested - [facility] monitoring.

## 2023-03-23 NOTE — H&P (Addendum)
CC: seizure  History is obtained from: patient, chart review  HPI: Michael Rogers is a 37 y.o. male with no significant past medical history who is admitted to epilepsy monitoring unit for characterization of spells.  History: Patient states he used to shake his right leg when he had trouble sleeping.  However his wife was bothered by it.  Therefore we stopped doing it.  Then gradually over the last year his wife reported that he has whole body shaking during sleep.  Initially he thought it same right leg shaking he does but his wife mentioned that it was more of a whole body jerking.  He also reported feeling like he bit his tongue on both sides usually on the posterior aspect as well as had many teeth chipped in his sleep.  He was seen by neurology and recommended to start on medications but was hesitant to start any antiseizure medications.  However few weeks ago he had an episode of urinary incontinence as well as has had about 3 back-to-back seizure-like episodes at night.  Therefore he was eventually started on Onfi.  States episodes have still continued almost every night.  However states he usually does not sleep in the same room as his wife and therefore is not aware of the exact frequency of episodes  Epilepsy risk factors: Reports normal vaginal delivery denies NICU stay, denies febrile seizures, denies any head injury with loss of consciousness, denies family history of epilepsy, denies meningitis/encephalitis  Past ASMs: None  Current antiseizure medication: Onfi 10 mg daily at night  Routine EEG 03/05/2022: Normal  Adult prolonged EEG 07/30/2022 : Normal   ROS: All other systems reviewed and negative except as noted in the HPI.   Past Medical History:  Diagnosis Date   Allergy    Asthma    Chest pain    COVID-19 virus infection 02/08/2021   H. pylori infection    Prostatitis 05/29/2017   Puncture wound of right foot 02/28/2016    Family History  Problem Relation Age of  Onset   Kidney disease Father    Hypertension Paternal Grandmother    Heart disease Paternal Uncle    Diabetes Maternal Aunt    Colon cancer Neg Hx    Stomach cancer Neg Hx      Social History:  reports that he quit smoking about 9 years ago. His smoking use included cigarettes. He started smoking about 10 years ago. He has a 5 pack-year smoking history. He has never used smokeless tobacco. He reports current alcohol use. He reports current drug use. Drug: Marijuana.   Medications Prior to Admission  Medication Sig Dispense Refill Last Dose   cloBAZam (ONFI) 10 MG tablet Take 1 tablet (10 mg total) by mouth 2 (two) times daily. 30 tablet 5       Exam: Current vital signs: BP 118/73 (BP Location: Right Arm)   Pulse (!) 57   Temp 98.4 F (36.9 C) (Oral)   Resp 18   SpO2 99%  Vital signs in last 24 hours: Temp:  [98.4 F (36.9 C)] 98.4 F (36.9 C) (08/19 0947) Pulse Rate:  [57] 57 (08/19 0947) Resp:  [18] 18 (08/19 0947) BP: (118)/(73) 118/73 (08/19 0947) SpO2:  [99 %] 99 % (08/19 0947)   Physical Exam  Constitutional: Appears well-developed and well-nourished.  Psych: Affect appropriate to situation Neuro: Aox3, no aphasia, CN 2-12 grossly intact, 5/5 in all extremities, FTN intact, intact to light touch  I have reviewed labs in  epic and the results pertinent to this consultation are: CBC: No results for input(s): "WBC", "NEUTROABS", "HGB", "HCT", "MCV", "PLT" in the last 168 hours.  Basic Metabolic Panel:  Lab Results  Component Value Date   NA 139 02/14/2022   K 4.5 02/14/2022   CO2 26 02/14/2022   GLUCOSE 93 02/14/2022   BUN 14 02/14/2022   CREATININE 1.01 02/14/2022   CALCIUM 10.3 (H) 02/14/2022   GFRNONAA 108 09/21/2018   GFRAA 125 09/21/2018   Lipid Panel: No results found for: "LDLCALC" HgbA1c:  Lab Results  Component Value Date   HGBA1C 5.2 09/21/2018   Urine Drug Screen: No results found for: "LABOPIA", "COCAINSCRNUR", "LABBENZ", "AMPHETMU",  "THCU", "LABBARB"  Alcohol Level No results found for: "ETH"   I have reviewed the images obtained:  MRI Brain w and wo contrast personally reviewed: Unremarkable appearance of the brain.   ASSESSMENT/PLAN: 37 year old male with seizure-like episodes admitted to epilepsy monitoring unit for characterization of spells.  Seizure -Start video EEG monitoring for characterization of spells -Hold antiseizure medications -Plan for hyperventilation, photic stimulation and sleep deprivation tomorrow -Continue seizure precautions -As needed IV Versed for clinical seizures   Jazzma Neidhardt Epilepsy [facility]

## 2023-03-24 DIAGNOSIS — R569 Unspecified convulsions: Secondary | ICD-10-CM | POA: Diagnosis not present

## 2023-03-24 NOTE — Progress Notes (Signed)
LTM maint complete - no skin breakdown under: FP1, F7    Activations complete. No events to report

## 2023-03-24 NOTE — TOC Initial Note (Signed)
Transition of Care [facility]) - Initial/Assessment Note    Patient Details  Name: Michael Rogers MRN: [id number] Date of Birth: [birth year]  Transition of Care [facility]) CM/SW Contact:    Kermit Balo, RN Phone Number: 03/24/2023, 1:18 PM  Clinical Narrative:                 Pt is from home with his spouse and son. No DME at home. Pt works. Pt drives self. He manage his own medications and denies any issues.  Pt has transport home when medically ready.   SDOH Interventions Today    Flowsheet Row Most Recent Value  SDOH Interventions   Food Insecurity Interventions Inpatient TOC      Pt states that sometimes groceries expensive. CM has added food resource information to his AVS.  Expected Discharge Plan: (P) Home/Self Care Barriers to Discharge: Continued Medical Work up   Patient Goals and CMS Choice            Expected Discharge Plan and Services   Discharge Planning Services: CM Consult   Living arrangements for the past 2 months: Single Family Home                                      Prior Living Arrangements/Services Living arrangements for the past 2 months: Single Family Home Lives with:: Minor Children, Spouse Patient language and need for interpreter reviewed:: Yes Do you feel safe going back to the place where you live?: Yes            Criminal Activity/Legal Involvement Pertinent to Current Situation/Hospitalization: No - Comment as needed  Activities of Daily Living Home Assistive Devices/Equipment: Blood pressure cuff ADL Screening (condition at time of admission) Patient's cognitive ability adequate to safely complete daily activities?: Yes Is the patient deaf or have difficulty hearing?: No Does the patient have difficulty seeing, even when wearing glasses/contacts?: Yes Does the patient have difficulty concentrating, remembering, or making decisions?: Yes (describes forgetfullness post covid) Patient able to express need for assistance  with ADLs?: Yes Does the patient have difficulty dressing or bathing?: No Independently performs ADLs?: Yes (appropriate for developmental age) Does the patient have difficulty walking or climbing stairs?: No Weakness of Legs: None Weakness of Arms/Hands: None  Permission Sought/Granted                  Emotional Assessment Appearance:: Appears stated age Attitude/Demeanor/Rapport: Engaged Affect (typically observed): Accepting Orientation: : Oriented to Self, Oriented to Place, Oriented to  Time, Oriented to Situation   Psych Involvement: No (comment)  Admission diagnosis:  Seizure [facility]) [R56.9] Patient Active Problem List   Diagnosis Date Noted   Seizure (HCC) 03/23/2023   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 pylori infection 01/21/2012   PCP:  [name], MD Pharmacy:   [phone], [place] - [address] AT [facility] [address] [place] [address] Phone: [phone] Fax:  [phone]     Social Determinants of Health (SDOH) Social History: SDOH Screenings   Food Insecurity: Food Insecurity Present (03/23/2023)  Housing: Low Risk  (03/23/2023)  Transportation Needs: No Transportation Needs (03/23/2023)  Utilities: Not At Risk (03/23/2023)  Depression (PHQ2-9): Medium Risk (09/23/2022)  Social Connections: Unknown (12/17/2021)   Received from [facility]  Tobacco Use: Medium Risk (03/23/2023)   SDOH Interventions: Food Insecurity Interventions: Inpatient  TOC   Readmission Risk Interventions     No data to display

## 2023-03-24 NOTE — Procedures (Addendum)
Patient Name: MARKIEL BATTERSBY  MRN: [id number]  Epilepsy Attending: Charlsie Quest  Referring Physician/Provider: Charlsie Quest, MD Duration: 03/23/2023 4696 to 03/24/2023 2952  Patient history: 37 year old male with seizure-like episodes getting eeg for characterization of spells.   Level of alertness: Awake, asleep  AEDs during EEG study: Onfi  Technical aspects: This EEG study was done with scalp electrodes positioned according to the 10-20 International system of electrode placement. Electrical activity was reviewed with band pass filter of 1-70Hz , sensitivity of 7 uV/mm, display speed of 80mm/sec with a 60Hz  notched filter applied as appropriate. EEG data were recorded continuously and digitally stored.  Video monitoring was available and reviewed as appropriate.  Description: The posterior dominant rhythm consists of 9 Hz activity of moderate voltage (25-35 uV) seen predominantly in posterior head regions, symmetric and reactive to eye opening and eye closing. Sleep was characterized by vertex waves, sleep spindles (12 to 14 Hz), maximal frontocentral region. There is an excessive amount of 15 to 18 Hz beta activity distributed symmetrically and diffusely.  Hyperventilation and photic stimulation were not performed.     ABNORMALITY - Excessive beta, generalized  IMPRESSION: This study is within normal limits. The excessive beta activity seen in the background is most likely due to the effect of benzodiazepine and is a benign EEG pattern.No seizures or epileptiform discharges were seen throughout the recording.  A normal interictal EEG does not exclude the diagnosis of epilepsy.  Jahon Bart [name]

## 2023-03-24 NOTE — Progress Notes (Signed)
Subjective: No acute events overnight.  No new concerns.  ROS: negative except above  Examination  Vital signs in last 24 hours: Temp:  [97.9 F (36.6 C)-99 F (37.2 C)] 98.4 F (36.9 C) (08/20 1213) Pulse Rate:  [49-73] 52 (08/20 1213) Resp:  [16-20] 17 (08/20 1213) BP: (106-136)/(63-95) 136/95 (08/20 1213) SpO2:  [98 %-100 %] 100 % (08/20 1213)  General: lying in bed, NAD Neuro: AOx3, cranial nerves are grossly intact, spontaneously moving all 4 extremities in bed  Basic Metabolic Panel: Recent Labs  Lab 03/23/23 1201  NA 137  K 3.8  CL 103  CO2 27  GLUCOSE 87  BUN 8  CREATININE 0.86  CALCIUM 8.9  MG 1.9  PHOS 3.1    CBC: Recent Labs  Lab 03/23/23 1201  WBC 5.7  NEUTROABS 2.5  HGB 14.2  HCT 41.4  MCV 100.2*  PLT 206     Coagulation Studies: Recent Labs    03/23/23 1201  LABPROT 15.2  INR 1.2    Imaging No new brain imaging overnight  ASSESSMENT AND PLAN: 37 year old male with seizure-like episodes admitted to epilepsy monitoring unit for characterization of spells.   Seizure -Continue video EEG monitoring for characterization of spells -Hold antiseizure medications -Hyperventilation, photic stimulation and sleep deprivation today -Discussed overnight EEG findings -Continue seizure precautions -As needed IV Versed for clinical seizures  I have spent a total of  26  minutes with the patient reviewing hospital notes,  test results, labs and examining the patient as well as establishing an assessment and plan that was discussed personally with the patient.  > 50% of time was spent in direct patient care.        Lindie Spruce Epilepsy [facility] For questions after 5pm please refer to AMION to reach the Neurologist on call

## 2023-03-25 DIAGNOSIS — R569 Unspecified convulsions: Secondary | ICD-10-CM | POA: Diagnosis not present

## 2023-03-25 LAB — BASIC METABOLIC PANEL
Anion gap: 9 (ref 5–15)
BUN: 9 mg/dL (ref 6–20)
CO2: 25 mmol/L (ref 22–32)
Calcium: 9.3 mg/dL (ref 8.9–10.3)
Chloride: 102 mmol/L (ref 98–111)
Creatinine, Ser: 0.92 mg/dL (ref 0.61–1.24)
GFR, Estimated: >60 mL/min (ref >60)
Glucose, Bld: 91 mg/dL (ref 70–99)
Potassium: 3.6 mmol/L (ref 3.5–5.1)
Sodium: 136 mmol/L (ref 135–145)

## 2023-03-25 LAB — MAGNESIUM: Magnesium: 2.1 mg/dL (ref 1.7–2.4)

## 2023-03-25 NOTE — Progress Notes (Signed)
Per tele tech pt's heart rate dropped to 36 but did not sustain. Pt asymptomatic,pt states that hr typically runs in the low to mid 40s. Provider on call paged, vitals stable. See new orders.   03/25/23 0234  Vitals  Temp 98 F (36.7 C)  Temp Source Oral  BP 104/66  MAP (mmHg) 76  BP Location Right Arm  BP Method Automatic  Patient Position (if appropriate) Lying  Pulse Rate (!) 42  Pulse Rate Source Monitor  Resp 16  MEWS COLOR  MEWS Score Color Green  Oxygen Therapy  SpO2 100 %  O2 Device Room Air  MEWS Score  MEWS Temp 0  MEWS Systolic 0  MEWS Pulse 1  MEWS RR 0  MEWS LOC 0  MEWS Score 1

## 2023-03-25 NOTE — Progress Notes (Signed)
On Call Neurologist Note  RN notified by central tele for bradycardia in to the 30s. Clinically asymptomatic and sleeping. Hospitalist notified by tele and ordered labs and EKG. EKG with bradyarrhythmia in comparison to prior EKG. Reached out to cardiology on call (curbside) for EKG review-appreciate the review. This is likely nocturnal bradycardia. No red flags. Neurology to follow labs in the AM. Consult cardiology if remains bradycardic or any other concerns on tele.  Appreciate hospitalist and cardiology on call review of the tele and EKG.  -- Milon Dikes, MD Neurologist [facility] Pager: [phone]

## 2023-03-25 NOTE — Progress Notes (Signed)
 Maint done, no skin breakdown

## 2023-03-25 NOTE — Plan of Care (Signed)

## 2023-03-25 NOTE — Procedures (Addendum)
Patient Name: Michael Rogers  MRN: [id number]  Epilepsy Attending: Charlsie Quest  Referring Physician/Provider: Charlsie Quest, MD Duration: 03/24/2023 5784 to 03/25/2023 6962   Patient history: 37 year old male with seizure-like episodes getting eeg for characterization of spells.    Level of alertness: Awake, asleep   AEDs during EEG study: None   Technical aspects: This EEG study was done with scalp electrodes positioned according to the 10-20 International system of electrode placement. Electrical activity was reviewed with band pass filter of 1-70Hz , sensitivity of 7 uV/mm, display speed of 94mm/sec with a 60Hz  notched filter applied as appropriate. EEG data were recorded continuously and digitally stored.  Video monitoring was available and reviewed as appropriate.   Description: The posterior dominant rhythm consists of 9 Hz activity of moderate voltage (25-35 uV) seen predominantly in posterior head regions, symmetric and reactive to eye opening and eye closing. Sleep was characterized by vertex waves, sleep spindles (12 to 14 Hz), maximal frontocentral region. There is an excessive amount of 15 to 18 Hz beta activity distributed symmetrically and diffusely.  Physiologic photic driving was seen during photic stimulation.  No EEG change was seen during hyperventilation.  ABNORMALITY - Excessive beta, generalized   IMPRESSION: This study is within normal limits. The excessive beta activity seen in the background is most likely due to the effect of benzodiazepine and is a benign EEG pattern.No seizures or epileptiform discharges were seen throughout the recording.   A normal interictal EEG does not exclude the diagnosis of epilepsy.   Janete Quilling [name]

## 2023-03-25 NOTE — Progress Notes (Addendum)
Subjective: had episode of bradycardia with HR in 30's, EKG shows sinus brady. Cardiology curbside reviewed eeg and suspected nocturnal bradycardia.  ROS: negative except above  Examination  Vital signs in last 24 hours: Temp:  [97.6 F (36.4 C)-98.4 F (36.9 C)] 98.1 F (36.7 C) (08/21 0745) Pulse Rate:  [42-58] 57 (08/21 0745) Resp:  [16-20] 17 (08/21 0745) BP: (104-136)/(66-95) 115/74 (08/21 0745) SpO2:  [98 %-100 %] 100 % (08/21 0745)  General: lying in bed, NAD Neuro: AOx3, cranial nerves are grossly intact, spontaneously moving all 4 extremities in bed  Basic Metabolic Panel: Recent Labs  Lab 03/23/23 1201 03/25/23 0444  NA 137 136  K 3.8 3.6  CL 103 102  CO2 27 25  GLUCOSE 87 91  BUN 8 9  CREATININE 0.86 0.92  CALCIUM 8.9 9.3  MG 1.9 2.1  PHOS 3.1  --     CBC: Recent Labs  Lab 03/23/23 1201  WBC 5.7  NEUTROABS 2.5  HGB 14.2  HCT 41.4  MCV 100.2*  PLT 206    Coagulation Studies: Recent Labs    03/23/23 1201  LABPROT 15.2  INR 1.2    Imaging No new brain imaging overnight   ASSESSMENT AND PLAN: 37 year old male with seizure-like episodes admitted to epilepsy monitoring unit for characterization of spells.   Seizure -Continue video EEG monitoring for characterization of spells -Hold antiseizure medications -Discussed overnight EEG findings -Continue seizure precautions -As needed IV Versed for clinical seizures  Nocturnal bradycardia - EKG with sinus bradycardia, labs normal - has been seen by cardiology in past and had 48 hours holter monitor as well as 30 day event monitor ( can't see the results) - Recommend f/u with PCP. May benefit from repeat 30 day cardiac monitor   I have spent a total of  27  minutes with the patient reviewing hospital notes,  test results, labs and examining the patient as well as establishing an assessment and plan that was discussed personally with the patient.  > 50% of time was spent in direct patient  care.    Lindie Spruce Epilepsy [facility] For questions after 5pm please refer to AMION to reach the Neurologist on call

## 2023-03-25 NOTE — Progress Notes (Addendum)
Patient's bedside nurse reported that his heart rate is 36 he remains usually upper 150s. - Patient is currently asymptomatic. -Checking stat EKG, checking stat electrolytes. -Holding labetalol. Patient is also on Versed 2 mg every 4 hour as needed for seizure.  However patient has not got any dose of Versed and labetalol yet.   Tereasa Coop, MD [facility] 03/25/2023, 2:24 AM

## 2023-03-25 NOTE — Progress Notes (Signed)
Performed maintenance on Fp2.  All under 10

## 2023-03-26 DIAGNOSIS — R569 Unspecified convulsions: Secondary | ICD-10-CM | POA: Diagnosis not present

## 2023-03-26 NOTE — Progress Notes (Signed)
Maint attempted, pt asleep. Maint deferred until morning

## 2023-03-26 NOTE — Progress Notes (Signed)
Subjective: NAEO. Has brief episodes of head and shoulder jerking.  ROS: negative except above  Examination  Vital signs in last 24 hours: Temp:  [98.1 F (36.7 C)-98.3 F (36.8 C)] 98.3 F (36.8 C) (08/22 0733) Pulse Rate:  [50-76] 56 (08/22 0733) Resp:  [18] 18 (08/22 0733) BP: (106-138)/(65-92) 114/70 (08/22 0733) SpO2:  [98 %-99 %] 99 % (08/22 0733) Weight:  [90.6 kg] 90.6 kg (08/21 1113)  General: lying in bed, NAD Neuro: AOx3, cranial nerves are grossly intact, spontaneously moving all 4 extremities in bed  Basic Metabolic Panel: Recent Labs  Lab 03/23/23 1201 03/25/23 0444  NA 137 136  K 3.8 3.6  CL 103 102  CO2 27 25  GLUCOSE 87 91  BUN 8 9  CREATININE 0.86 0.92  CALCIUM 8.9 9.3  MG 1.9 2.1  PHOS 3.1  --     CBC: Recent Labs  Lab 03/23/23 1201  WBC 5.7  NEUTROABS 2.5  HGB 14.2  HCT 41.4  MCV 100.2*  PLT 206     Coagulation Studies: Recent Labs    03/23/23 1201  LABPROT 15.2  INR 1.2    Imaging No new brain imaging overnight   ASSESSMENT AND PLAN: 37 year old male with seizure-like episodes admitted to epilepsy monitoring unit for characterization of spells.   Seizure -Continue video EEG monitoring for characterization of spells -Hold antiseizure medications -Discussed overnight EEG findings -Continue seizure precautions -As needed IV Versed for clinical seizures   Nocturnal bradycardia - EKG with sinus bradycardia, labs normal - has been seen by cardiology in past and had 48 hours holter monitor as well as 30 day event monitor ( can't see the results) - Recommend f/u with PCP. May benefit from repeat 30 day cardiac monitor   I have spent a total of  28  minutes with the patient reviewing hospital notes,  test results, labs and examining the patient as well as establishing an assessment and plan that was discussed personally with the patient.  > 50% of time was spent in direct patient care.   Lindie Spruce Epilepsy [facility] For questions after 5pm please refer to AMION to reach the Neurologist on call

## 2023-03-26 NOTE — Progress Notes (Signed)
Performed maintenance on leads.  All under 10 

## 2023-03-26 NOTE — Progress Notes (Signed)
LTM maint complete - no skin breakdown under: Fp2 Fp1 Serviced O1 Fp2 [facility] monitored

## 2023-03-26 NOTE — Procedures (Signed)
Patient Name: Michael Rogers  MRN: [id number]  Epilepsy Attending: Charlsie Quest  Referring Physician/Provider: Charlsie Quest, MD Duration: 03/25/2023 5956 to 03/26/2023 3875   Patient history: 37 year old male with seizure-like episodes getting eeg for characterization of spells.    Level of alertness: Awake, asleep   AEDs during EEG study: None   Technical aspects: This EEG study was done with scalp electrodes positioned according to the 10-20 International system of electrode placement. Electrical activity was reviewed with band pass filter of 1-70Hz , sensitivity of 7 uV/mm, display speed of 72mm/sec with a 60Hz  notched filter applied as appropriate. EEG data were recorded continuously and digitally stored.  Video monitoring was available and reviewed as appropriate.   Description: The posterior dominant rhythm consists of 9 Hz activity of moderate voltage (25-35 uV) seen predominantly in posterior head regions, symmetric and reactive to eye opening and eye closing. Sleep was characterized by vertex waves, sleep spindles (12 to 14 Hz), maximal frontocentral region. There is an excessive amount of 15 to 18 Hz beta activity distributed symmetrically and diffusely.   During sleep multiple episodes of subtle head and shoulder jerking were noted.  Concomitant EEG before, during and after the event showed myogenic artifact and no EEG changes noted to suggest seizure.    [name] ABNORMALITY - Excessive beta, generalized   IMPRESSION: This study is within normal limits. The excessive beta activity seen in the background is most likely due to the effect of benzodiazepine and is a benign EEG pattern.No seizures or epileptiform discharges were seen throughout the recording.  During sleep multiple episodes of subtle head and shoulder jerking were noted without concomitant EEG change to suggest seizure.  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   [name] [name]

## 2023-03-26 NOTE — Plan of Care (Signed)

## 2023-03-27 DIAGNOSIS — R001 Bradycardia, unspecified: Secondary | ICD-10-CM | POA: Diagnosis not present

## 2023-03-27 DIAGNOSIS — G253 Myoclonus: Secondary | ICD-10-CM | POA: Diagnosis not present

## 2023-03-27 DIAGNOSIS — R569 Unspecified convulsions: Secondary | ICD-10-CM | POA: Diagnosis not present

## 2023-03-27 MED ORDER — CLOBAZAM 10 MG PO TABS: 10.0000 mg | ORAL_TABLET | Freq: Two times a day (BID) | ORAL | Status: DC

## 2023-03-27 MED ORDER — CLOBAZAM 10 MG PO TABS: 10.0000 mg | ORAL_TABLET | Freq: Every day | ORAL | Status: DC

## 2023-03-27 MED ORDER — CLOBAZAM 10 MG PO TABS: 10.0000 mg | ORAL_TABLET | Freq: Every day | ORAL | 5 refills | Status: DC

## 2023-03-27 NOTE — Progress Notes (Signed)
LTM EEG discontinued - no skin breakdown at unhook. [facility] notified 

## 2023-03-27 NOTE — TOC Transition Note (Signed)
Transition of Care [facility]) - CM/SW Discharge Note   Patient Details  Name: Michael Rogers MRN: [id number] Date of Birth: 07/26/1986  Transition of Care [facility]) CM/SW Contact:  Kermit Balo, RN Phone Number: 03/27/2023, 9:43 AM   Clinical Narrative:     Pt is discharging home. Food information added to AVS. Pt has transport home.  Final next level of care: Home/Self Care Barriers to Discharge: No Barriers Identified   Patient Goals and CMS Choice      Discharge Placement                         Discharge Plan and Services Additional resources added to the After Visit Summary for     Discharge Planning Services: CM Consult                                 Social Determinants of Health (SDOH) Interventions SDOH Screenings   Food Insecurity: Food Insecurity Present (03/23/2023)  Housing: Low Risk  (03/23/2023)  Transportation Needs: No Transportation Needs (03/23/2023)  Utilities: Not At Risk (03/23/2023)  Depression (PHQ2-9): Medium Risk (09/23/2022)  Social Connections: Unknown (12/17/2021)   Received from [facility]  Tobacco Use: Medium Risk (03/23/2023)     Readmission Risk Interventions     No data to display

## 2023-03-27 NOTE — Progress Notes (Signed)
Patient ready for discharge to home; discharge instructions given and reviewed; Rx given to patient; patient dressed and ready to leave; discharged out via wheelchair; belongings returned.

## 2023-03-27 NOTE — Discharge Instructions (Signed)
You were admitted to [facility] for seizure-like episodes.  During this time, you underwent continuous video EEG monitoring.  Your antiseizure medication was held.  Hyperventilation, photic stimulation and sleep deprivation were performed.  We recorded multiple episodes of slight head and shoulder jerk while you are asleep.  However no EEG change was noted during those episodes.  These episodes appear consistent with sleep myoclonus.  Rest of her EEG was within normal limits.  Of note, you were noted to have low heart rate while asleep.  We recommended following up with your primary care physician and potentially a cardiologist.  Also consider baby monitor or camera in your room at night when you sleep alone so we can get a video of the episodes you have at home.  In the meantime, recommend continuing Onfi 10 mg at night.  Continue to follow-up with Dr. Teresa Coombs

## 2023-03-27 NOTE — Discharge Summary (Signed)
Physician Discharge Summary  Patient ID: Michael Rogers MRN: [id number] DOB/AGE: 12-Jul-1986 37 y.o.  Admit date: 03/23/2023 Discharge date: 03/27/2023  Admission Diagnoses:seizure  Discharge Diagnoses: Sleep myoclonus  Discharged Condition: stable  Hospital Course: Michael Rogers was admitted to epilepsy monitoring unit between 03/23/2023 to 03/27/2023.  During this time, he underwent continuous video EEG monitoring.  Hyperventilation and photic stimulation as well as sleep deprivation were performed.  Antiseizure medications were held.  Multiple episodes of subtle head and shoulder jerk were noted at night when patient was sleeping.  Concomitant EEG before, during and after the episodes did not show any EEG changes suggest seizure.  These episodes appear consistent with sleep myoclonus.  No ictal-interictal abnormality was noted throughout rest of the EEG.  Of note, patient was noted to have nocturnal bradycardia with heart rate as low as in the 30s.  Patient reports he has had workup done with cardiology few years ago.  Recommend follow-up with PCP and potentially cardiology again for a 30-day event monitor.  Also discussed considering a baby monitor or camera in his room when he sleeps alone at night so that we can get a video of the episodes he has at home.  In the meantime, recommend continuing Onfi 10 mg nightly and continue seizure precautions including do not drive.  Continue to follow-up with Dr. Teresa Coombs.  Consults: None  Significant Diagnostic Studies:Video EEG  Description: The posterior dominant rhythm consists of 9 Hz activity of moderate voltage (25-35 uV) seen predominantly in posterior head regions, symmetric and reactive to eye opening and eye closing. Sleep was characterized by vertex waves, sleep spindles (12 to 14 Hz), maximal frontocentral region. There is an excessive amount of 15 to 18 Hz beta activity distributed symmetrically and diffusely.    During sleep multiple episodes  of subtle head and shoulder jerking were noted.  Concomitant EEG before, during and after the event showed myogenic artifact and no EEG changes noted to suggest seizure.     Michael Rogers ABNORMALITY - Excessive beta, generalized   IMPRESSION: This study is within normal limits. The excessive beta activity seen in the background is most likely due to the effect of benzodiazepine and is a benign EEG pattern.No seizures or epileptiform discharges were seen throughout the recording.   During sleep multiple episodes of subtle head and shoulder jerking were noted without concomitant EEG change to suggest seizure.  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  Treatments: Continue clobazam 10 mg nightly  Discharge Exam: Blood pressure 110/69, pulse (!) 54, temperature 97.7 F (36.5 C), temperature source Oral, resp. rate 18, height 5\' 9"  (1.753 m), weight 90.6 kg, SpO2 100%. General: lying in bed, NAD Neuro: AOx3, cranial nerves are grossly intact, spontaneously moving all 4 extremities in bed  Discharge disposition: Home  Allergies as of 03/27/2023       Reactions   Amoxil [amoxicillin] Nausea Only   Dizziness Lightheadedness   Dairycare [lactase-lactobacillus] Other (See Comments)   Unknown reaction   Dust Mite Extract Itching   Sneezing & Itchiness In Eyes + pollen and grass. .   Latex Rash       Other Other (See Comments)   Patient prefers to take liquid instead of tablets or capsule.   Pork-derived Products Nausea Only        Medication List     TAKE these medications    cloBAZam 10 MG tablet Commonly known as:  ONFI Take 1 tablet (10 mg total) by mouth at bedtime.       Seizure precautions  During the Seizure   - First, ensure adequate ventilation and place patients on the floor on their left side  Loosen clothing around the neck and ensure the airway is patent. If the patient is clenching the  teeth, do not force the mouth open with any object as this can cause severe damage - Remove all items from the surrounding that can be hazardous. The patient may be oblivious to what's happening and may not even know what he or she is doing. If the patient is confused and wandering, either gently guide him/her away and block access to outside areas - Reassure the individual and be comforting - Call 911. In most cases, the seizure ends before EMS arrives. However, there are cases when seizures may last over 3 to 5 minutes. Or the individual may have developed breathing difficulties or severe injuries. If a pregnant patient or a person with diabetes develops a seizure, it is prudent to call an ambulance.    After the Seizure (Postictal Stage)   After a seizure, most patients experience confusion, fatigue, muscle pain and/or a headache. Thus, one should permit the individual to sleep. For the next few days, reassurance is essential. Being calm and helping reorient the person is also of importance.   Most seizures are painless and end spontaneously. Seizures are not harmful to others but can lead to complications such as stress on the lungs, brain and the heart. Individuals with prior lung problems may develop labored breathing and respiratory distress.    I have spent a total of  38 minutes with the patient reviewing hospital notes,  test results, labs and examining the patient as well as establishing an assessment and plan that was discussed personally with the patient.  > 50% of time was spent in direct patient care.   Signed: Charlsie Quest 03/27/2023, 9:06 AM

## 2023-03-27 NOTE — Procedures (Signed)
Patient Name: Michael Rogers  MRN: [id number]  Epilepsy Attending: Charlsie Quest  Referring Physician/Provider: Charlsie Quest, MD Duration: 03/26/2023 4098 to 03/27/2023 1191   Patient history: 37 year old male with seizure-like episodes getting eeg for characterization of spells.    Level of alertness: Awake, asleep   AEDs during EEG study: None   Technical aspects: This EEG study was done with scalp electrodes positioned according to the 10-20 International system of electrode placement. Electrical activity was reviewed with band pass filter of 1-70Hz , sensitivity of 7 uV/mm, display speed of 52mm/sec with a 60Hz  notched filter applied as appropriate. EEG data were recorded continuously and digitally stored.  Video monitoring was available and reviewed as appropriate.   Description: The posterior dominant rhythm consists of 9 Hz activity of moderate voltage (25-35 uV) seen predominantly in posterior head regions, symmetric and reactive to eye opening and eye closing. Sleep was characterized by vertex waves, sleep spindles (12 to 14 Hz), maximal frontocentral region. There is an excessive amount of 15 to 18 Hz beta activity distributed symmetrically and diffusely.    [name] ABNORMALITY - Excessive beta, generalized   IMPRESSION: This study is within normal limits. The excessive beta activity seen in the background is most likely due to the effect of benzodiazepine and is a benign EEG pattern.No seizures or epileptiform discharges were seen throughout the recording.   A normal interictal EEG does not exclude the diagnosis of epilepsy.   Sreenidhi Ganson [name]

## 2023-03-30 ENCOUNTER — Telehealth: Payer: Self-pay

## 2023-03-30 NOTE — Patient Outreach (Signed)
This encounter was created in error - please disregard. Care Management  Transitions of Care Program Managed Medicaid Transitions of Care intitial outreach  03/30/2023 Name: Michael Rogers MRN: [id number] DOB: 04/23/1986  Subjective: Michael Rogers is a 37 y.o. year old male who is a primary care patient of [name], MD. The Care Management team was unable to reach the patient by phone to assess and address transitions of care needs.   Plan: Additional outreach attempts will be made to reach the patient enrolled in the [facility] Program (Post Inpatient/ED Visit). Cadi Rhinehart J. Cash Duce, RN, BSN, MSN Care Management Coordinator/[place] Phone Number:  [phone]

## 2023-03-30 NOTE — Transitions of Care (Post Inpatient/ED Visit) (Signed)
   03/30/2023  Name: Michael Rogers MRN: [id number] DOB: [birth year]  Today's TOC FU Call Status: Today's TOC FU Call Status:: Unsuccessful Call (1st Attempt) Unsuccessful Call (1st Attempt) Date: 03/30/23  Attempted to reach the patient regarding the most recent Inpatient/ED visit.  Follow Up Plan: Additional outreach attempts will be made to reach the patient to complete the Transitions of Care (Post Inpatient/ED visit) call.    Maliya Marich J. Cristela Felt, RN, BSN, MSN Care Management Coordinator/[place] Phone Number: [phone]

## 2023-03-30 NOTE — Transitions of Care (Post Inpatient/ED Visit) (Signed)
   03/30/2023  Name: Michael Rogers MRN: [id number] DOB: Feb 27, 1986  Today's TOC FU Call Status: Unsuccessful Call (1st Attempt) Date: 03/30/23  Attempted to reach the patient regarding the most recent Inpatient/ED visit.  Follow Up Plan: Additional outreach attempts will be made to reach the patient to complete the Transitions of Care (Post Inpatient/ED visit) call.    Tal Neer J. Cristela Felt, RN, BSN, MSN Care Management Coordinator/[place] Phone Number:  [phone]

## 2023-03-31 ENCOUNTER — Telehealth: Payer: Self-pay

## 2023-03-31 NOTE — Transitions of Care (Post Inpatient/ED Visit) (Signed)
   03/31/2023  Name: Michael Rogers MRN: [id number] DOB: [birth year]  Today's TOC FU Call Status: Today's TOC FU Call Status:: Unsuccessful Call (2nd Attempt) Unsuccessful Call (1st Attempt) Date: 03/30/23 Unsuccessful Call (2nd Attempt) Date: 03/31/23 [facility] with patient, he is currently unavailable at this time to complete TOC follow-up call. Patient to call RNCM back later this am)  Attempted to reach the patient regarding the most recent Inpatient/ED visit.  Patient currently unavailable to complete TOC follow-up call.  States he will call RNCM back later this am.   Follow Up Plan:  Patient to call RNCM back later this am.  If no return call today,, Additional outreach attempts will be made to reach the patient to complete the Transitions of Care (Post Inpatient/ED visit) call.   Jerri Hargadon J. Cristela Felt, RN, BSN, MSN Care Management Coordinator/[place] Phone Number:  [phone]

## 2023-03-31 NOTE — Transitions of Care (Post Inpatient/ED Visit) (Signed)
03/31/2023  Name: Michael Rogers MRN: [id number] DOB: [birth year]  Today's TOC FU Call Status: Today's TOC FU Call Status:: Successful TOC FU Call Completed Unsuccessful Call (1st Attempt) Date: 03/30/23 Unsuccessful Call (2nd Attempt) Date: 03/31/23 [facility] FU Call Complete Date: 03/31/23 Patient's Name and Date of Birth confirmed.  Transition Care Management Follow-up Telephone Call Date of Discharge: 03/27/23 Discharge Facility: [facility]) Type of Discharge: Inpatient Admission Primary Inpatient Discharge Diagnosis:: "Seizure" How have you been since you were released from the hospital?: Better  Items Reviewed: Did you receive and understand the discharge instructions provided?: No Medications obtained,verified, and reconciled?: Yes (Medications Reviewed) Any new allergies since your discharge?: No Dietary orders reviewed?: Yes Type of Diet Ordered:: Low NA, Heart Healthy Do you have support at home?: Yes People in Home: spouse Name of Support/Comfort Primary Source: Lives with spouse who is available to assist with care if needed.  Medications Reviewed Today: Medications Reviewed Today     Reviewed by Amada Kingfisher, RN (Registered Nurse) on 03/31/23 at 1048  Med List Status: <None>   Medication Order Taking? Sig Documenting Provider Last Dose Status Informant  cloBAZam (ONFI) 10 MG tablet [id number] Yes Take 1 tablet (10 mg total) by mouth at bedtime. Charlsie Quest, MD Taking Active             Home Care and Equipment/Supplies: Were Home Health Services Ordered?: No Any new equipment or medical supplies ordered?: No  Functional Questionnaire: Do you need assistance with meal preparation?: No Do you need assistance with eating?: No Do you have difficulty maintaining continence: No Do you need assistance with getting out of bed/getting out of a chair/moving?: No Do you have difficulty managing or taking your medications?: No  Follow up appointments  reviewed: PCP Follow-up appointment confirmed?: No (Discussed PCP post hospital follow-up appointment.  Patient to call PCP office today to schedule a post hospiital flu appointment and schdule for this week or early next week.) Specialist Hospital Follow-up appointment confirmed?: Yes Date of Specialist follow-up appointment?: 04/20/23 [facility] [place] [facility] [place] [facility] - Monday 9/16 at 11 am.  Office visit with Windell Norfolk, [facility], Monday,9/30/24at 11 am) Follow-Up Specialty Provider:: Cardilogist, Neurologist Do you need transportation to your follow-up appointment?: No Do you understand care options if your condition(s) worsen?: Yes-patient verbalized understanding  SDOH Interventions Today    Flowsheet Row Most Recent Value  SDOH Interventions   Food Insecurity Interventions Intervention Not Indicated  Transportation Interventions Intervention Not Indicated  Utilities Interventions Intervention Not Indicated       Goals Addressed             This Visit's Progress    Patient stated       Current Barriers:  Knowledge Deficits related to plan of care for management of post discharge follow-up care.    RNCM Clinical Goal(s):  Patient will continue to work with RN Care Manager to address care management and care coordination needs related to  post discharge follow-up care  as evidenced by adherence to CM Team Scheduled appointments through collaboration with RN Care manager, provider, and care team.   Interventions: Schedule Post discharge PCP follow-up appointment  Evaluation of current treatment plan related to  self management and patient's adherence to plan as established by provider Educate patient on TOC 30 day care management program.   Educate patient on seizure precautions Educate patient on s/s of bradycardia Post Hospital Discharge Follow-up  (Status:  New goal.)  Short Term Goal Discussed plans with patient for ongoing  care management follow up and provided patient with direct contact information for care management team Evaluation of current treatment plan related to post discharge follow-up and patient's adherence to plan as established by provider  Patient Goals/Self-Care Activities: Attend all scheduled provider appointments Call provider office for new concerns or questions  Patient to schedule post discharge follow-up appointment with PCP   Follow Up Plan:  Telephone follow up appointment with care management team member scheduled for:  Tuesday, 04/07/23 11 am.  The patient has been provided with contact information for the care management team and has been advised to call with any health related questions or concerns.              Matilynn Dacey J. Cristela Felt, RN, BSN, MSN Care Management Coordinator/ Phone Number:  [phone]

## 2023-03-31 NOTE — Patient Instructions (Signed)
Visit Information  Thank you for taking time to visit with me today. Please don't hesitate to contact me if I can be of assistance to you before our next scheduled telephone appointment.  Following are the goals we discussed today:    Our next appointment is by telephone on Tuesday, 04/07/23 at 11 an  Please call the care guide team at [phone] if you need to cancel or reschedule your appointment.   Please call the Suicide and Crisis Lifeline: 988 if you are experiencing a Mental Health or Behavioral Health Crisis or need someone to talk to.  Following is a copy of your care plan:   Goals Addressed             This Visit's Progress    Patient stated       Current Barriers:  Knowledge Deficits related to plan of care for management of post discharge follow-up care.    RNCM Clinical Goal(s):  Patient will continue to work with RN Care Manager to address care management and care coordination needs related to  post discharge follow-up care  as evidenced by adherence to CM Team Scheduled appointments through collaboration with RN Care manager, provider, and care team.   Interventions: Schedule Post discharge PCP follow-up appointment  Evaluation of current treatment plan related to  self management and patient's adherence to plan as established by provider Educate patient on TOC 30 day care management program.   Educate patient on seizure precautions Educate patient on s/s of bradycardia Post Hospital Discharge Follow-up  (Status:  New goal.)  Short Term Goal Discussed plans with patient for ongoing care management follow up and provided patient with direct contact information for care management team Evaluation of current treatment plan related to post discharge follow-up and patient's adherence to plan as established by provider  Patient Goals/Self-Care Activities: Attend all scheduled provider appointments Call provider office for new concerns or questions  Patient to  schedule post discharge follow-up appointment with PCP   Follow Up Plan:  Telephone follow up appointment with care management team member scheduled for:  Tuesday, 04/07/23 11 am.  The patient has been provided with contact information for the care management team and has been advised to call with any health related questions or concerns.              Patient verbalizes understanding of instructions and care plan provided today and agrees to view in MyChart. Active MyChart status and patient understanding of how to access instructions and care plan via MyChart confirmed with patient.     Telephone follow up appointment with care management team member scheduled for: Tuesday, 04/07/2023 11 am Contact Dr. Teodora Medici office to schedule post discharge follow-up appointment prior to next call from care management appointment.   Dreshaun Stene J. Cristela Felt, RN, BSN, MSN Care Management Coordinator/[place] Phone Number:  [phone]

## 2023-04-02 ENCOUNTER — Telehealth: Payer: Self-pay | Admitting: Adult Health

## 2023-04-02 NOTE — Telephone Encounter (Signed)
Patient called the after hours service. They were unable to reach the oncall MD so I received the call.  Patient inquiring about a bee sting that day and if he can take any medication for swelling with his seizure medication.  I was not able to review his chart in epic as I do not have access at this time.  Advised answer service to reach out to the patient and let him know he should go to urgent care or reach out to his PCP for treatment of a bee sting.  Immediately after the call I was able to get in touch with Dr. Lucia Gaskins who was on-call.  She never received a call from answering service but she did get a text message.  She read the text message and agreed that the patient should reach out to urgent care or his PCP.  She did not have any further instructions.

## 2023-04-07 ENCOUNTER — Telehealth: Payer: Self-pay

## 2023-04-07 ENCOUNTER — Other Ambulatory Visit: Payer: BC Managed Care – PPO

## 2023-04-07 NOTE — Patient Outreach (Signed)
  Care Management  Transitions of Care Program Transitions of Care Post-discharge week 3  04/07/2023 Name: Michael Rogers MRN: [id number] DOB: [birth year]  Subjective: Michael Rogers is a 37 y.o. year old male who is a primary care patient of [name], MD. The Care Management team was unable to reach the patient by phone to assess and address transitions of care needs.   Plan: Additional outreach attempts will be made to reach the patient enrolled in the [facility] Program (Post Inpatient/ED Visit).  Michael Rogers J. Cristela Felt, RN, BSN, MSN Care Management Coordinator/[place] Phone Number:  [phone]

## 2023-04-09 ENCOUNTER — Encounter: Payer: Self-pay | Admitting: Family Medicine

## 2023-04-10 ENCOUNTER — Ambulatory Visit (INDEPENDENT_AMBULATORY_CARE_PROVIDER_SITE_OTHER): Payer: BC Managed Care – PPO | Admitting: Family Medicine

## 2023-04-10 ENCOUNTER — Encounter: Payer: Self-pay | Admitting: Family Medicine

## 2023-04-10 VITALS — BP 119/81 | HR 51 | Ht 69.0 in | Wt 189.0 lb

## 2023-04-10 DIAGNOSIS — G253 Myoclonus: Secondary | ICD-10-CM | POA: Diagnosis not present

## 2023-04-10 DIAGNOSIS — R001 Bradycardia, unspecified: Secondary | ICD-10-CM

## 2023-04-10 NOTE — Progress Notes (Addendum)
    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 He was hospitalized with the neurology team for prolonged EEG monitoring. The patient endorsed feeling better since he was started on Clobazam 10 mg nightly. No other concerns.  Bradycardia: Per the patient, his neurologist suggested that his myoclonus might be related to his bradycardia and recommended Cards f/u for reassessment. He has no cardiopulmonary symptoms.   PERTINENT  PMH / PSH: PMHx reviewed  OBJECTIVE:   BP 119/81   Pulse (!) 51   Ht 5\' 9"  (1.753 m)   Wt 189 lb (85.7 kg)   SpO2 100%   BMI 27.91 kg/m   Physical Exam Vitals and nursing note reviewed.  Cardiovascular:     Rate and Rhythm: Regular rhythm. Bradycardia present.     Heart sounds: Normal heart sounds. No murmur heard. Pulmonary:     Effort: Pulmonary effort is normal. No respiratory distress.     Breath sounds: Normal breath sounds. No wheezing.  Neurological:     General: No focal deficit present.     Cranial Nerves: No cranial nerve deficit.     Sensory: No sensory deficit.     Gait: Gait normal.     Deep Tendon Reflexes: Reflexes normal.      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   He declined flu shot  Janit Pagan, MD [facility]

## 2023-04-10 NOTE — Assessment & Plan Note (Signed)
Hospital record including labs, EEG and notes reviewed Stable on current regimen F/U with neurologist as planned

## 2023-04-10 NOTE — Patient Instructions (Signed)
Bradycardia, Adult Bradycardia is a slower-than-normal heartbeat. A normal resting heart rate for an adult ranges from 60 to 100 beats per minute. With bradycardia, the resting heart rate is less than 60 beats per minute. Bradycardia can prevent enough oxygen from reaching certain areas of your body when you are active. It can be serious if it keeps enough oxygen from reaching your brain and other parts of your body. Bradycardia is not a problem for everyone. For some healthy adults, a slow resting heart rate is normal. What are the causes? This condition may be caused by: A problem with the heart, including: A problem with the heart's electrical system, such as a heart block. With a heart block, electrical signals between the chambers of the heart are partially or completely blocked, so they are not able to work as they should. A problem with the heart's natural pacemaker (sinus node). Heart disease. A heart attack. Heart damage. Lyme disease. A heart infection. A heart condition that is present at birth (congenital heart defect). Certain medicines that treat heart conditions. Certain conditions, such as hypothyroidism and obstructive sleep apnea. Problems with the balance of chemicals and other substances, like potassium, in the blood. Trauma. Radiation therapy. What increases the risk? You are more likely to develop this condition if you: Are age 65 or older. Have high blood pressure (hypertension), high cholesterol (hyperlipidemia), or diabetes. Drink heavily, use tobacco or nicotine products, or use drugs. What are the signs or symptoms? Symptoms of this condition include: Light-headedness. Feeling faint or fainting. Fatigue and weakness. Trouble with activity or exercise. Shortness of breath. Chest pain (angina). Drowsiness. Confusion. Dizziness. How is this diagnosed? This condition may be diagnosed based on: Your symptoms. Your medical history. A physical exam. During  the exam, your health care provider will listen to your heartbeat and check your pulse. To confirm the diagnosis, your health care provider may order tests, such as: Blood tests. An electrocardiogram (ECG). This test records the heart's electrical activity. The test can show how fast your heart is beating and whether the heartbeat is steady. A test in which you wear a portable device (event recorder or Holter monitor) to record your heart's electrical activity while you go about your day. An exercise test. How is this treated? Treatment for this condition depends on the cause of the condition and how severe your symptoms are. Treatment may involve: Treatment of the underlying condition. Changing your medicines or how much medicine you take. Having a small, battery-operated device called a pacemaker implanted under the skin. When bradycardia occurs, this device can be used to increase your heart rate and help your heart beat in a regular rhythm. Follow these instructions at home: Lifestyle Manage any health conditions that contribute to bradycardia as told by your health care provider. Follow a heart-healthy diet. A nutrition specialist (dietitian) can help educate you about healthy food options and changes. Follow an exercise program that is approved by your health care provider. Maintain a healthy weight. Try to reduce or manage your stress, such as with yoga or meditation. If you need help reducing stress, ask your health care provider. Do not use any products that contain nicotine or tobacco. These products include cigarettes, chewing tobacco, and vaping devices, such as e-cigarettes. If you need help quitting, ask your health care provider. Do not use illegal drugs. Alcohol use If you drink alcohol: Limit how much you have to: 0-1 drink a day for women who are not pregnant. 0-2 drinks a day   for men. Know how much alcohol is in a drink. In the [place] one drink equals one 12 oz bottle of  beer (355 mL), one 5 oz glass of wine (148 mL), or one 1 oz glass of hard liquor (44 mL). General instructions Take over-the-counter and prescription medicines only as told by your health care provider. Keep all follow-up visits. This is important. How is this prevented? In some cases, bradycardia may be prevented by: Treating underlying medical problems. Stopping behaviors or medicines that can trigger the condition. Contact a health care provider if: You feel light-headed or dizzy. You almost faint. You feel weak or are easily fatigued during physical activity. You experience confusion or have memory problems. Get help right away if: You faint. You have chest pains or an irregular heartbeat (palpitations). You have trouble breathing. These symptoms may represent a serious problem that is an emergency. Do not wait to see if the symptoms will go away. Get medical help right away. Call your local emergency services (911 in the [place] Do not drive yourself to the hospital. Summary Bradycardia is a slower-than-normal heartbeat. With bradycardia, the resting heart rate is less than 60 beats per minute. Treatment for this condition depends on the cause. Manage any health conditions that contribute to bradycardia as told by your health care provider. Do not use any products that contain nicotine or tobacco. These products include cigarettes, chewing tobacco, and vaping devices, such as e-cigarettes. Keep all follow-up visits. This is important. This information is not intended to replace advice given to you by your health care provider. Make sure you discuss any questions you have with your health care provider. Document Revised: 11/11/2020 Document Reviewed: 11/11/2020 Elsevier Patient Education  2024 Elsevier Inc.  

## 2023-04-10 NOTE — Assessment & Plan Note (Signed)
Chronic Currently asymptomatic Unclear if related to his nightly myoclonus It appears his HR dropped to the 30s during hospitalization for EEG monitoring Repeat ECHO and cards referral placed.

## 2023-04-11 ENCOUNTER — Other Ambulatory Visit: Payer: Self-pay | Admitting: Student

## 2023-04-11 LAB — TSH RFX ON ABNORMAL TO FREE T4: TSH: 1.68 u[IU]/mL (ref 0.450–4.500)

## 2023-04-11 MED ORDER — FLUTICASONE PROPIONATE 50 MCG/ACT NA SUSP: 2.0000 | Freq: Every day | NASAL | 6 refills | Status: DC

## 2023-04-11 NOTE — Progress Notes (Signed)
**  After Hours/ Emergency Line Call**  Received a call to report that Michael Rogers p/f congestion ongoing a few days with nasal drainage.  Endorsing that he took a COVID test yesterday and was negative, feels this is related to sinus congestion.  Denying any other symptoms.  Recommended that continue with Flonase and Zyrtec with the knowledge that he is currently taking Clobazem for myoclonic jerking.  No true contraindications to Clobazem and Flonase or Zyrtec, cautious with sedation from H2 blocker and benzo. Will forward to PCP. Instructed to call back if he has any worsening symptoms.   Alfredo Martinez, MD  PGY-3, [facility] 04/11/2023 7:09 AM

## 2023-04-13 NOTE — Progress Notes (Signed)
Spoke with patient. Informed of ECHO on 9/20 at  at 3:00pm. Patient understood. Aquilla Solian, CMA

## 2023-04-14 ENCOUNTER — Telehealth: Payer: Self-pay

## 2023-04-14 ENCOUNTER — Other Ambulatory Visit: Payer: BC Managed Care – PPO

## 2023-04-14 NOTE — Patient Outreach (Signed)
Care Management  Transitions of Care Program Managed Medicaid Transitions of Care week 3   04/14/2023 Name: Michael Rogers MRN: [id number] DOB: 1985-09-16  Subjective: Michael Rogers is a 37 y.o. year old male who is a primary care patient of [name], MD. The Care Management team Engaged with patient Engaged with patient by telephone to assess and address transitions of care needs.   Consent to Services:  Patient was given information about Managed Medicaid Care Management services, agreed to services, and gave verbal consent to participate.   Assessment:           SDOH Interventions    Flowsheet Row Telephone from 04/14/2023 in [name] POPULATION HEALTH DEPARTMENT Telephone from 03/31/2023 in [place] POPULATION HEALTH DEPARTMENT Admission (Discharged) from 03/23/2023 in [place] [facility]  SDOH Interventions     Food Insecurity Interventions Intervention Not Indicated Intervention Not Indicated Inpatient [facility]  Housing Interventions Intervention Not Indicated -- --  Transportation Interventions Intervention Not Indicated Intervention Not Indicated --  Utilities Interventions Intervention Not Indicated Intervention Not Indicated --  Health Literacy Interventions Intervention Not Indicated -- --        Goals Addressed             This Visit's Progress    Patient stated       Current Barriers:  Knowledge Deficits related to plan of care for management of post discharge follow-up care.  Patient continues to experience seizure-like activity and low heart rate (dropping into the 30's) while sleeping. Diagnosis of Seizures ruled out. Referred to Cardiologist for additional cardiac work up including Holter monitoring at home. As of 9/10, patient has complied with all follow-up Appointments, still awaiting placement of Holter monitor  RNCM Clinical Goal(s):  Patient will continue to work with RN Care Manager to address care management and care  coordination needs related to  post discharge follow-up care  as evidenced by adherence to CM Team Scheduled appointments through collaboration with RN Care manager, Alyse Low; provider, Dr. Janit Pagan, new Cardiologist referral, and care team.   Interventions: Schedule Post discharge PCP follow-up appointment - As of 9/10 telephone visit with RN, patient reported following up with his PCP, Dr. [name] on 04/11/23. Reported that Dr. [name] referred him a different cardiologist than initially referred by hospitalist. Has upcoming scheduled appt with Cardiology, to include EKG. Evaluation of current treatment plan related to  self management and patient's adherence to plan as established by provider Educate patient on TOC 30 day care management program.   Educate patient on seizure precautions Educate patient on s/s of bradycardia Post Hospital Discharge Follow-up  (Status:  New goal.)  Short Term Goal Discussed plans with patient for ongoing care management follow up and provided patient with direct contact information for care management team Evaluation of current treatment plan related to post discharge follow-up and patient's adherence to plan as established by provider  Patient Goals/Self-Care Activities: Attend all scheduled provider appointments Call provider office for new concerns or questions  Patient to schedule post discharge follow-up appointment with PCP   Follow Up Plan:  Telephone follow up appointment with care management team member scheduled for:  Tuesday, 04/21/23 @ 1 pm  The patient has been provided with contact information for the care management team and has been advised to call with any health related questions or concerns.              Plan: Telephone appointment with Case Management/Transition of Care  Alyse Low, RN next Tuesday, September 17th at 1pm.  Alyse Low, RN, BA, [facility], CRRN [facility] Coordinator, Transition  of Care Ph # [phone]

## 2023-04-14 NOTE — Patient Instructions (Signed)
Visit Information  Michael Rogers,  Thank you for taking time to visit with me today. It is a pleasure to work with you in addressing your ongoing post-discharge needs that are your highest priority. You already self-identified as needing to follow up with a Cardiologist as soon as possible regarding your noted bradycardia during your sleep. You are your strongest advocate and your attitude toward addressing your health concerns is extremely positive and proactive. It sounds as if you have an amazing PCP who is also a strong Neurosurgeon & advocate. Please don't hesitate to contact me if I can be of assistance to you before our next scheduled telephone appointment.  Our next appointment is by telephone on September 17 at 1pm.  Warm regards,   Elnita Maxwell  Following is a copy of your care plan:   Goals Addressed             This Visit's Progress    Patient stated       Current Barriers:  Knowledge Deficits related to plan of care for management of post discharge follow-up care.  Patient continues to experience seizure-like activity and low heart rate (dropping into the 30's) while sleeping. Diagnosis of Seizures ruled out. Referred to Cardiologist for additional cardiac work up including Holter monitoring at home. As of 9/10, patient has complied with all follow-up Appointments, still awaiting placement of Holter monitor  RNCM Clinical Goal(s):  Patient will continue to work with RN Care Manager to address care management and care coordination needs related to  post discharge follow-up care  as evidenced by adherence to CM Team Scheduled appointments through collaboration with RN Care manager, Alyse Low; provider, Dr. Janit Pagan, new Cardiologist referral, and care team.   Interventions: Schedule Post discharge PCP follow-up appointment - As of 9/10 telephone visit with RN, patient reported following up with his PCP, Dr. [name] on 04/11/23. Reported that Dr. [name] referred him a different  cardiologist than initially referred by hospitalist. Has upcoming scheduled appt with Cardiology, to include EKG. Evaluation of current treatment plan related to  self management and patient's adherence to plan as established by provider Educate patient on TOC 30 day care management program.   Educate patient on seizure precautions Educate patient on s/s of bradycardia Post Hospital Discharge Follow-up  (Status:  New goal.)  Short Term Goal Discussed plans with patient for ongoing care management follow up and provided patient with direct contact information for care management team Evaluation of current treatment plan related to post discharge follow-up and patient's adherence to plan as established by provider  Patient Goals/Self-Care Activities: Attend all scheduled provider appointments Call provider office for new concerns or questions  Patient to schedule post discharge follow-up appointment with PCP   Follow Up Plan:  Telephone follow up appointment with care management team member scheduled for:  Tuesday, 04/21/23 @ 1 pm  The patient has been provided with contact information for the care management team and has been advised to call with any health related questions or concerns.              Patient verbalizes understanding of instructions and care plan provided today and agrees to view in MyChart. Active MyChart status and patient understanding of how to access instructions and care plan via MyChart confirmed with patient.     The patient has been provided with contact information for the care management team and has been advised to call with any health related questions or concerns.   Please call the  care guide team at [phone] if you need to cancel or reschedule your appointment.   Please call 1-800-273-TALK (toll free, 24 hour hotline) if you are experiencing a Mental Health or Behavioral Health Crisis or need someone to talk to.  Alyse Low, RN, BA, [facility], CRRN [facility]  [facility] Coordinator, Transition of Care Ph # [phone]

## 2023-04-20 ENCOUNTER — Encounter: Payer: Self-pay | Admitting: Cardiology

## 2023-04-20 ENCOUNTER — Telehealth: Payer: Self-pay

## 2023-04-20 ENCOUNTER — Ambulatory Visit: Payer: BC Managed Care – PPO | Admitting: Cardiology

## 2023-04-20 ENCOUNTER — Ambulatory Visit: Payer: BC Managed Care – PPO | Attending: Cardiology

## 2023-04-20 VITALS — BP 133/83 | HR 67 | Resp 16 | Ht 69.0 in | Wt 190.2 lb

## 2023-04-20 DIAGNOSIS — R001 Bradycardia, unspecified: Secondary | ICD-10-CM

## 2023-04-20 DIAGNOSIS — G253 Myoclonus: Secondary | ICD-10-CM

## 2023-04-20 NOTE — Progress Notes (Signed)
[name] Date of Birth: [birth year] MRN: [id number] Primary Care Provider:Eniola, Theador Hawthorne, MD Former Cardiology Providers: [place], APRN, FNP-C Primary Cardiologist: Tessa Lerner, DO, [facility] (established care 05/09/2020)  Date: 04/20/23 Last Office Visit: 05/09/2020  Chief Complaint  Patient presents with   Bradycardia   Hospitalization Follow-up    HPI  Michael Rogers is a 37 y.o.  male whose past medical history and cardiovascular risk factors include: Asthma, former tobacco use, history of cocaine use.  Patient was last seen in the practice in October 2021 and now presents for reevaluation of bradycardia.  During his recent hospitalization in August 2024 he was being evaluated for possible seizures.  During EEG monitoring he had episodes of nocturnal bradycardia and based on the discharge summary / patient heart rates as low as mid 30 bpm.  Clinically he denies anginal chest pain or heart failure symptoms.  No near-syncope or syncopal events.  ALLERGIES: Allergies  Allergen Reactions   Amoxil [Amoxicillin] Nausea Only    Dizziness Lightheadedness   Dairycare [Lactase-Lactobacillus] Other (See Comments)    Unknown reaction   Dust Mite Extract Itching    Sneezing & Itchiness In Eyes + pollen and grass. .   Latex Rash        Other Other (See Comments)    Patient prefers to take liquid instead of tablets or capsule.   Pork-Derived Products Nausea Only   MEDICATION LIST PRIOR TO VISIT: Current Outpatient Medications on File Prior to Visit  Medication Sig Dispense Refill   cloBAZam (ONFI) 10 MG tablet Take 1 tablet (10 mg total) by mouth at bedtime. 30 tablet 5   fluticasone (FLONASE) 50 MCG/ACT nasal spray Place 2 sprays into both nostrils daily. 16 g 6   No current facility-administered medications on file prior to visit.    PAST MEDICAL HISTORY: Past Medical History:  Diagnosis Date   Allergy    Asthma    Chest pain    COVID-19 virus infection  02/08/2021   H. pylori infection    Headache 09/21/2018   Prostatitis 05/29/2017   Puncture wound of right foot 02/28/2016    PAST SURGICAL HISTORY: Past Surgical History:  Procedure Laterality Date   COLONOSCOPY  2018   MOUTH SURGERY     ROOT CANAL      FAMILY HISTORY: The patient's family history includes Diabetes in his maternal aunt; Heart disease in his paternal uncle; Hypertension in his paternal grandmother; Kidney disease in his father.   SOCIAL HISTORY:  The patient  reports that he quit smoking about 9 years ago. His smoking use included cigarettes. He started smoking about 10 years ago. He has a 5 pack-year smoking history. He has never used smokeless tobacco. He reports current alcohol use. He reports current drug use. Drug: Marijuana.  Review of Systems  Cardiovascular:  Negative for chest pain, claudication, dyspnea on exertion, irregular heartbeat, leg swelling, near-syncope, orthopnea, palpitations, paroxysmal nocturnal dyspnea and syncope.  Respiratory:  Negative for shortness of breath.   Hematologic/Lymphatic: Negative for bleeding problem.  Musculoskeletal:  Negative for muscle cramps and myalgias.  Neurological:  Negative for dizziness and light-headedness.    PHYSICAL EXAM:    04/20/2023   11:07 AM 04/10/2023    9:03 AM 03/27/2023    9:29 AM  Vitals with BMI  Height 5\' 9"  5\' 9"    Weight 190 lbs 3 oz 189 lbs   BMI 28.07 27.9   Systolic 133 119 259  Diastolic 83 81 81  Pulse 67 51 55   Physical Exam  Constitutional: No distress.  Age appropriate, hemodynamically stable.   Neck: No JVD present.  Cardiovascular: Normal rate, regular rhythm, S1 normal, S2 normal, intact distal pulses and normal pulses. Exam reveals no gallop, no S3 and no S4.  No murmur heard. Pulmonary/Chest: Effort normal and breath sounds normal. No stridor. He has no wheezes. He has no rales.  Abdominal: Soft. Bowel sounds are normal. He exhibits no distension. There is no abdominal  tenderness.  Musculoskeletal:        General: No edema.     Cervical back: Neck supple.  Neurological: He is alert and oriented to person, place, and time. He has intact cranial nerves (2-12).  Skin: Skin is warm and moist.   CARDIAC DATABASE: EKG: 04/20/2023: Sinus rhythm, 60 bpm, without underlying ischemic injury pattern.  Echocardiogram: 10/06/2018: LVEF 55-60%, cavity size normal, normal diastolic function, no regional wall motion abnormalities, RVSP 19 mmHg.,  No significant valvular heart disease.  Stress Testing:  Exercise Treadmill Stress Test 10/20/2018: Indication: Chest pain, SOB, Fatigue The patient exercised on Bruce protocol for  08:18 min. Patient achieved  10.16 METS and reached HR  164 bpm, which is   87% of maximum age-predicted HR.  Stress test terminated due to dyspnea and dizziness.  Exercise capacity was normal. HR Response to Exercise: Appropriate. BP Response to Exercise: Normal resting BP- appropriate response. Chest Pain: none. Arrhythmias: none. Resting EKG demonstrates Normal sinus rhythm. ST Changes: With peak exercise there was no ST-T changes of ischemia.  Overall Impression: Normal stress test. Recommendations: Continue primary/secondary prevention.  Heart Catheterization: None  30 day event monitor 03/27-04/25/2020: Normal sinus rhythm. 1 auto detected event for sinus tachycardia at 163 bpm. No symptoms. 1 auto detected event for sinus rhythm/arrhythmia at 0511 on day 14. He was noted to have sinus tachycardia on day 14 at 12 pm with HR 166 bpm with brief aberancy likely rate related, no symptoms reported. Occasional sinus tachycardia. Several sinus bradycardia episodes in early morning hours with lowest heart rate of 38 bpm on 04/15.  No A fib, SVT, or high degree AV block.  LABORATORY DATA:    Latest Ref Rng & Units 03/23/2023   12:01 PM 09/21/2018   11:24 AM 09/28/2017    9:27 PM  CBC  WBC 4.0 - 10.5 K/uL 5.7  6.7  12.7   Hemoglobin 13.0 -  17.0 g/dL 81.1  91.4  78.2   Hematocrit 39.0 - 52.0 % 41.4  45.2  45.4   Platelets 150 - 400 K/uL 206  249  226        Latest Ref Rng & Units 03/25/2023    4:44 AM 03/23/2023   12:01 PM 02/14/2022   11:08 AM  CMP  Glucose 70 - 99 mg/dL 91  87  93   BUN 6 - 20 mg/dL 9  8  14    Creatinine 0.61 - 1.24 mg/dL 9.56  2.13  0.86   Sodium 135 - 145 mmol/L 136  137  139   Potassium 3.5 - 5.1 mmol/L 3.6  3.8  4.5   Chloride 98 - 111 mmol/L 102  103  99   CO2 22 - 32 mmol/L 25  27  26    Calcium 8.9 - 10.3 mg/dL 9.3  8.9  57.8   Total Protein 6.5 - 8.1 g/dL  6.1  7.5   Total Bilirubin 0.3 - 1.2 mg/dL  0.3  0.6   Alkaline Phos 38 -  126 U/L  46  68   AST 15 - 41 U/L  18  25   ALT 0 - 44 U/L  22  40     Lipid Panel  No results found for: "CHOL", "TRIG", "HDL", "CHOLHDL", "VLDL", "LDLCALC", "LDLDIRECT", "LABVLDL"  Lab Results  Component Value Date   HGBA1C 5.2 09/21/2018   No components found for: "NTPROBNP" Lab Results  Component Value Date   TSH 1.680 04/10/2023   TSH 1.460 02/14/2022   TSH 1.870 02/19/2021    Cardiac Panel (last 3 results) No results for input(s): "CKTOTAL", "CKMB", "TROPONINIHS", "RELINDX" in the last 72 hours.  IMPRESSION:    ICD-10-CM   1. Bradycardia  R00.1 EKG 12-Lead       RECOMMENDATIONS: Michael Rogers is a 37 y.o. male whose past medical history and cardiovascular risk factors include: Asthma, former tobacco use, history of cocaine use.  Bradycardia: Noted to have nocturnal episodes during his EEG. Patient endorses that he was informed that during his EEG heart rates were in the mid 30s while at rest.  He was asymptomatic. Patient is reassured that nocturnal bradycardia does not need additional workup. During the daytime his resting heart rate usually is between 40-50 bpm, per patient. No change in overall physical endurance. In the past he had a GXT which noted good chronotropic competence. He also invested into iWatch to monitor his resting  heart rate.  Clinically he does not need a Zio patch as he is asymptomatic and nocturnal bradycardia is usually normal physiology.  However patient is concerned so the  shared decision was to proceed with a cardiac monitor to evaluate for any dysrhythmias.   I have also asked the patient to discuss the role of sleep study with PCP to consider acting given the recent findings of nocturnal bradycardia.  As long as results of Zio patch are favorable and he remains asymptomatic he can follow-up on as-needed basis otherwise 74-month follow-up can be considered.   FINAL MEDICATION LIST END OF ENCOUNTER: No orders of the defined types were placed in this encounter.   There are no discontinued medications.    Current Outpatient Medications:    cloBAZam (ONFI) 10 MG tablet, Take 1 tablet (10 mg total) by mouth at bedtime., Disp: 30 tablet, Rfl: 5   fluticasone (FLONASE) 50 MCG/ACT nasal spray, Place 2 sprays into both nostrils daily., Disp: 16 g, Rfl: 6  Orders Placed This Encounter  Procedures   EKG 12-Lead   --Continue cardiac medications as reconciled in final medication list. --No follow-ups on file. Or sooner if needed. --Continue follow-up with your primary care physician regarding the management of your other chronic comorbid conditions.  Patient's questions and concerns were addressed to his satisfaction. He voices understanding of the instructions provided during this encounter.   This note was created using a voice recognition software as a result there may be grammatical errors inadvertently enclosed that do not reflect the nature of this encounter. Every attempt is made to correct such errors.  Tessa Lerner, [place], [facility]  Pager:  [phone] Office: [phone]

## 2023-04-20 NOTE — Progress Notes (Unsigned)
Enrolled for [employer] to mail a ZIO XT long term holter monitor to the patients address on file.  

## 2023-04-20 NOTE — Telephone Encounter (Signed)
Patient calls nurse line requesting to speak with PCP in regards to Cardiologist visit.   He reports he was told by Cardiology that he needed a sleep study performed. He reports Cardiology "didn't really do anything." He reports he is following up in 3 months with them.   He reports confusion as he thought this would have been done during hospital stay.   Advised will forward to PCP.

## 2023-04-21 ENCOUNTER — Telehealth: Payer: Self-pay

## 2023-04-21 ENCOUNTER — Other Ambulatory Visit: Payer: BC Managed Care – PPO

## 2023-04-21 NOTE — Patient Outreach (Signed)
Care Management  Transitions of Care Program Managed Medicaid Transitions of Care week 3+   04/21/2023 Name: Michael Rogers MRN: [id number] DOB: 07/28/1986  Subjective: Michael Rogers is a 37 y.o. year old male who is a primary care patient of [name], MD. The Care Management team Engaged with patient Engaged with patient by telephone to assess and address transitions of care needs.   Consent to Services:  Patient was given information about Managed Medicaid Care Management services, agreed to services, and gave verbal consent to participate.   Assessment:           SDOH Interventions    Flowsheet Row Telephone from 04/14/2023 in [place] [facility]UL[facility]ALTH DEPARTMENT Telephone from 03/31/2023 in [place] POPULATION HEALTH DEPARTMENT Admission (Discharged) from 03/23/2023 in [place] [facility]  SDOH Interventions     Food Insecurity Interventions Intervention Not Indicated Intervention Not Indicated Inpatient [facility]  Housing Interventions Intervention Not Indicated -- --  Transportation Interventions Intervention Not Indicated Intervention Not Indicated --  Utilities Interventions Intervention Not Indicated Intervention Not Indicated --  Health Literacy Interventions Intervention Not Indicated -- --        Goals Addressed             This Visit's Progress    Patient stated       Current Barriers:  Knowledge Deficits related to plan of care for management of post discharge follow-up care.  Patient continues to experience seizure-like activity and low heart rate (dropping into the 30's) while sleeping. Diagnosis of Seizures ruled out. Referred to Cardiologist for additional cardiac work up including Holter monitoring at home. As of 9/17, patient has complied with all follow-up Appointments, still awaiting Zio cardiac monitor ordered & mailed to him on 9/16 by Cardiologist for 14d home cardiac monitoring AND awaiting appt for polysomnography (sleep)  study to access patient for any unusual nocturnal breathing that may be impacting his heart rate which routinely runs in the low 30's at night. Additional TOC program week/appt scheduled with patient on Sept 24 @ 1pm  RNCM Clinical Goal(s):  Patient will continue to work with RN Care Manager to address care management and care coordination needs related to  post discharge follow-up care  as evidenced by adherence to CM Team Scheduled appointments through collaboration with RN Care manager, Alyse Low; provider, Dr. Janit Pagan, new Cardiologist referral, and care team.   Interventions: Completed post discharge PCP follow-up appointment with his PCP, Dr. [name] on 04/11/23. Reported that Dr. [name] referred him back to Cardiology, as the patient reported he felt his cardiac symptoms were dismissed during the first follow-up Cardiology appt. after discharge from the hospital. Completed follow-up appointment with Cardiology, including EKG, on 04/20/23. 14d Zio cardiac monitor was ordered and is enroute to patient's address on file. Evaluation of current treatment plan related to self management and patient's adherence to plan as established by provider Educate patient on TOC 30 day care management program, patient agreed to extend our program engagement by one week for reasons described above. Next appt 9/24 @1pm .  Educate patient on seizure precautions - completed Educate patient on s/s of bradycardia - completed Pulmonology to contact patient to schedule polysomnography (sleep) study [facility] Discharge Follow-up  (Status:  On track)  Short Term Goal Discussed plans with patient for ongoing care management follow up and provided patient with direct contact information for care management team Evaluation of current treatment plan related to post discharge follow-up and patient's adherence  to plan as established by provider  Patient Goals/Self-Care Activities: Attend all scheduled provider  appointments Call provider office for new concerns or questions    Follow Up Plan:  Telephone follow up appointment with care management team member scheduled for:  Tuesday, 04/28/23 @ 1 pm  The patient has been provided with contact information for the care management team and has been advised to call with any health related questions or concerns.              Plan: The patient has been provided with contact information for the care management team and has been advised to call with any health related questions or concerns.   Alyse Low, RN, BA, [facility], CRRN [facility] Coordinator, Transition of Care Ph # [phone]

## 2023-04-21 NOTE — Patient Instructions (Signed)
Visit Information  Hi Michael Rogers,  Thank you for taking time to visit with me today. Your proactive measures to improve your health are so impressive and I'm glad you have such a wonderful PCP who actually listens to you! Please don't hesitate to contact me if I can be of assistance to you before our next scheduled telephone appointment.  Our next and final Transition of Care appointment is by telephone on Sept 24 at 1pm.  Warm Regards,  Michael Rogers  Following is a copy of your care plan:   Goals Addressed             This Visit's Progress    Patient stated       Current Barriers:  Knowledge Deficits related to plan of care for management of post discharge follow-up care.  Patient continues to experience seizure-like activity and Rogers heart rate (dropping into the 30's) while sleeping. Diagnosis of Seizures ruled out. Referred to Cardiologist for additional cardiac work up including Holter monitoring at home. As of 9/17, patient has complied with all follow-up Appointments, still awaiting Zio cardiac monitor ordered & mailed to him on 9/16 by Cardiologist for 14d home cardiac monitoring AND awaiting appt for polysomnography (sleep) study to access patient for any unusual nocturnal breathing that may be impacting his heart rate which routinely runs in the Rogers 30's at night. Additional TOC program week/appt scheduled with patient on Sept 24 @ 1pm  RNCM Clinical Goal(s):  Patient will continue to work with RN Care Manager to address care management and care coordination needs related to  post discharge follow-up care  as evidenced by adherence to CM Team Scheduled appointments through collaboration with RN Care manager, Michael Rogers; provider, Michael Rogers, new Cardiologist referral, and care team.   Interventions: Completed post discharge PCP follow-up appointment with his PCP, Michael Rogers on 04/11/23. Reported that Michael Rogers referred him back to Cardiology, as the patient reported he felt  his cardiac symptoms were dismissed during the first follow-up Cardiology appt. after discharge from the hospital. Completed follow-up appointment with Cardiology, including EKG, on 04/20/23. 14d Zio cardiac monitor was ordered and is enroute to patient's address on file. Evaluation of current treatment plan related to self management and patient's adherence to plan as established by provider Educate patient on TOC 30 day care management program, patient agreed to extend our program engagement by one week for reasons described above. Next appt 9/24 @1pm .  Educate patient on seizure precautions - completed Educate patient on s/s of bradycardia - completed Pulmonology to contact patient to schedule polysomnography (sleep) study [facility] Discharge Follow-up  (Status:  On track)  Short Term Goal Discussed plans with patient for ongoing care management follow up and provided patient with direct contact information for care management team Evaluation of current treatment plan related to post discharge follow-up and patient's adherence to plan as established by provider  Patient Goals/Self-Care Activities: Attend all scheduled provider appointments Call provider office for new concerns or questions    Follow Up Plan:  Telephone follow up appointment with care management team member scheduled for:  Tuesday, 04/28/23 @ 1 pm  The patient has been provided with contact information for the care management team and has been advised to call with any health related questions or concerns.              Patient verbalizes understanding of instructions and care plan provided today and agrees to view in MyChart. Active MyChart status and patient understanding of  how to access instructions and care plan via MyChart confirmed with patient.     The patient has been provided with contact information for the care management team and has been advised to call with any health related questions or concerns.    Please call the care guide team at [phone] if you need to cancel or reschedule your appointment.   Please call 1-800-273-TALK (toll free, 24 hour hotline) if you are experiencing a Mental Health or Behavioral Health Crisis or need someone to talk to.  Michael Low, RN, BA, [facility], CRRN [facility] Coordinator, Transition of Care Ph # [phone]

## 2023-04-22 NOTE — Telephone Encounter (Signed)
Spoke with patient. Informed of note left by provider. Patient understood. Aquilla Solian, CMA

## 2023-04-23 NOTE — Addendum Note (Signed)
Addended by: Janit Pagan T on: 04/23/2023 11:56 AM   Modules accepted: Orders

## 2023-04-24 ENCOUNTER — Ambulatory Visit (HOSPITAL_COMMUNITY): Payer: BC Managed Care – PPO

## 2023-04-28 ENCOUNTER — Telehealth: Payer: Self-pay

## 2023-04-28 ENCOUNTER — Other Ambulatory Visit: Payer: BC Managed Care – PPO

## 2023-04-28 NOTE — Patient Outreach (Signed)
Care Management  Transitions of Care Program Transitions of Care Post-discharge week 4  04/28/2023 Name: Michael Rogers MRN: [id number] DOB: [birth year]  Subjective: Michael Rogers is a 37 y.o. year old male who is a primary care patient of [name], MD. The Care Management RN spoke with patient by telephone briefly to assess and address transitions of care needs. Patient busy at work, did not have time to talk - has not had a chance to go to his P.O. box to see if 14d Zio XT Long term Holter Monitor has been delivered vua [employer] - will reschedule final appt for 2 weeks from today   Plan: Additional outreach attempts will be made to reach the patient enrolled in the [facility] Program (Post Inpatient/ED Visit). Next scheduled, and last follow up appointment, made for Oct. 8 at 1pm  Alyse Low, RN, BA, [facility], CRRN [facility] Coordinator, Transition of Care Ph # [phone]

## 2023-05-04 ENCOUNTER — Ambulatory Visit (INDEPENDENT_AMBULATORY_CARE_PROVIDER_SITE_OTHER): Payer: BC Managed Care – PPO | Admitting: Family Medicine

## 2023-05-04 ENCOUNTER — Ambulatory Visit (HOSPITAL_COMMUNITY)
Admission: RE | Admit: 2023-05-04 | Discharge: 2023-05-04 | Disposition: A | Discharge status: Home / Self Care | Payer: BC Managed Care – PPO | Source: Ambulatory Visit | Attending: Family Medicine

## 2023-05-04 ENCOUNTER — Encounter: Payer: Self-pay | Admitting: Family Medicine

## 2023-05-04 VITALS — BP 120/80 | HR 57 | Ht 69.0 in | Wt 185.4 lb

## 2023-05-04 DIAGNOSIS — R42 Dizziness and giddiness: Secondary | ICD-10-CM

## 2023-05-04 NOTE — Patient Instructions (Signed)
Please be sure to follow-up with cardiology.  Please continue to press the button on your monitor whenever you feel your symptoms.  If you are still feeling lightheaded especially in the mornings, you can consider reaching out to your neurologist as they may have you decrease your dose of Onfi

## 2023-05-04 NOTE — Progress Notes (Signed)
    SUBJECTIVE:   CHIEF COMPLAINT / HPI:   Lightheadedness since Friday 9/27 Over the the weekend, didn't feel it much. Then this morning felt lightheaded/"woozy" again when he bent down to pick something up and stood up quickly Symptoms have not improved with sitting Has experienced this in the past and thought it may have been related to his bradycardia Has been staying well hydrated and eating normally Denies syncope, CP, SOB, fevers, N/V/D, abd pain, sick contacts Endorses mild headache which he noticed this morning. Denies numbness/weakness in extremities. Denies head injury No new medications - on Onfi nightly for his hx of seizures but has been taking this dose for months  Noted to have a history of bradycardia for which he saw cardiology and placed a long-term monitor - he received and placed this today, has started to press the button to record events   PERTINENT  PMH / PSH: History of seizures on Onfi - most recent episode a few months ago  OBJECTIVE:   BP 120/80   Pulse (!) 57   Ht 5\' 9"  (1.753 m)   Wt 185 lb 6 oz (84.1 kg)   SpO2 100%   BMI 27.38 kg/m    Orthostatic vital signs: Lying: HR 58, BP 113/64 Sitting: HR 41, BP 125/88 Standing: HR 55, BP 118/81  General: NAD, pleasant, able to participate in exam Cardiac: RRR, no murmurs auscultated Respiratory: CTAB, normal WOB Abdomen: soft, non-tender, non-distended, normoactive bowel sounds Extremities: warm and well perfused, no edema or cyanosis Skin: warm and dry, no rashes noted Neuro: alert, speech normal.  Cranial nerves II through XII intact without focal deficits Psych: Normal affect and mood   ASSESSMENT/PLAN:   Assessment & Plan Lightheadedness No syncope.  Neuroexam unremarkable.  Normal blood work within the last month.  Suspect this may be related to his chronic bradycardia or potentially his Onfi.  Orthostatics negative in office today.  EKG stable from prior with sinus bradycardia and no acute  ischemic changes.  Encouraged hydration and increase in sodium intake, encouraged follow-up with cardiology.  May consider reaching out to neurology if symptoms persist to consider decreasing Onfi dose.  Provided return precautions.   Vonna Drafts, MD [facility]

## 2023-05-08 ENCOUNTER — Other Ambulatory Visit: Payer: Self-pay

## 2023-05-08 ENCOUNTER — Other Ambulatory Visit: Payer: Self-pay | Admitting: Neurology

## 2023-05-08 NOTE — Telephone Encounter (Signed)
Patient calls nurse line in regards to Clobazam.   He reports he called over to [facility] for a refill, however reports they are closed today.   He reports he does not want to be without his medication for the weekend.   Advised with forward to PCP for advisement.

## 2023-05-10 ENCOUNTER — Other Ambulatory Visit: Payer: Self-pay | Admitting: Neurology

## 2023-05-10 MED ORDER — CLOBAZAM 10 MG PO TABS: 10.0000 mg | ORAL_TABLET | Freq: Every day | ORAL | 5 refills | Status: DC

## 2023-05-11 NOTE — Telephone Encounter (Signed)
I sent the medication yesterday.

## 2023-05-12 ENCOUNTER — Ambulatory Visit (HOSPITAL_COMMUNITY): Payer: BC Managed Care – PPO

## 2023-05-12 ENCOUNTER — Telehealth: Payer: Self-pay

## 2023-05-12 ENCOUNTER — Other Ambulatory Visit: Payer: BC Managed Care – PPO

## 2023-05-12 NOTE — Patient Outreach (Signed)
  Care Management  Transitions of Care Program Managed Medicaid Transitions of Care week 4  05/12/2023 Name: Michael Rogers MRN: [id number] DOB: July 16, 1986  Subjective: Michael Rogers is a 37 y.o. year old male who is a primary care patient of [name], MD. The Care Management team was unable to reach the patient by phone to assess and address transitions of care needs.   Plan: Additional outreach attempts will be made to reach the patient enrolled in the [facility] Program (Post Inpatient/ED Visit).  Alyse Low, RN, BA, [facility], CRRN [facility] Coordinator, Transition of Care Ph # [phone]

## 2023-05-13 ENCOUNTER — Telehealth: Payer: Self-pay

## 2023-05-13 ENCOUNTER — Other Ambulatory Visit: Payer: BC Managed Care – PPO

## 2023-05-13 NOTE — Patient Outreach (Signed)
  Care Management  Transitions of Care Program Managed Medicaid Transitions of Care week 4  05/13/2023 Name: [name] MRN: [id number] DOB: March 07, 1986  Subjective: Michael Rogers is a 37 y.o. year old male who is a primary care patient of [name], MD. The Care Management team was unable to reach the patient by phone to assess and address transitions of care needs.   Plan: No further outreach attempts will be made at this time.  We have been unable to reach the patient.  Alyse Low, RN, BA, [facility], CRRN [facility] Coordinator, Transition of Care Ph # [phone]

## 2023-05-15 ENCOUNTER — Telehealth: Payer: Self-pay

## 2023-05-15 NOTE — Telephone Encounter (Signed)
Spoke with patient. Informed of message left by Dr. [name]. Patient understood. Aquilla Solian, CMA

## 2023-05-15 NOTE — Telephone Encounter (Signed)
-----   Message from Janit Pagan sent at 05/11/2023 12:49 PM EDT ----- Hello team,  Please advise patient that his insurance did not approve his ECHO order. Thanks.  Dr. [name] ----- Message ----- From: Sunday Spillers, CMA Sent: 05/11/2023   9:39 AM EDT To: [name], MD  Hi Dr [name],  You ordered an ECHO for this pt. His insurance denied the procedure. See comment below.    .Resting Transthoracic Echocardiography       Non-Authorized  Criteria Not Met  Review Exam Withdraw Exam Clinical Rationale:  Your doctor told us that you had an abnormal finding on your heart tracing (EKG). Your doctor ordered an ultrasound picture of your heart. This test should be used when there are certain abnormal findings on your heart tracing. We reviewed the notes we have. The notes do not show that you have such abnormal findings on your heart tracing. Based on the information we have, this test is not medically necessary for you. We used [employer] Medical Benefits Management Clinical Guideline titled Imaging of the Heart, Resting Transthoracic Echocardiography to make this decision. You may view this guideline at [email or web address].   Clemencia Course

## 2023-06-03 ENCOUNTER — Telehealth: Payer: Self-pay | Admitting: Cardiology

## 2023-06-03 ENCOUNTER — Telehealth: Payer: Self-pay | Admitting: Family Medicine

## 2023-06-03 DIAGNOSIS — R001 Bradycardia, unspecified: Secondary | ICD-10-CM | POA: Diagnosis not present

## 2023-06-03 NOTE — Telephone Encounter (Signed)
After-hours call  Spoke with patient about Zio patch that continues to fall off.  He was even sent a replacement and has been trying to use medical grade tape to keep it on, but this keeps falling off.  He notes he works in [employer] and sweats a lot.  He has this patch due to lower heart rates.  He was last seen by Dr. Odis Hollingshead at [facility] where his bradycardic dips nocturnally were felt to be physiologic; however, due to shared decision making, he was given a Zio patch.    He states he had also been having lightheadedness for which he came into the [facility] for later in September, and at that time, he was informed that [facility] had went out of business.  His EKG at that time showed bradycardia without ischemic changes.  He was instructed to take in more electrolytes.    He had another episode of lightheadedness where he had to go to bed earlier last night.  He overall seems to be well, however, over the phone.  Discussed with him that I would contact his PCP Dr. [name] to see if we could get him connected with a new cardiologist who can further help him with a Zio patch and further workup.  Also advised him to return to care should his lightheadedness worsen, should he fall, or should he have chest pain while awaiting a new appointment with his cardiologist.  Patient amenable to plan and very appreciative.

## 2023-06-03 NOTE — Telephone Encounter (Signed)
I spoke with the patient. He feels well. I advised him to contact his cardiologist's ([facility]) @ [phone] to help him with the Zio patch. He will see me soon in the clinic.

## 2023-06-03 NOTE — Telephone Encounter (Signed)
STAT if HR is under 50 or over 120 (normal HR is 60-100 beats per minute)  What is your heart rate?   Do you have a log of your heart rate readings (document readings)?   Do you have any other symptoms?  Lightheadedness, weight loss.   Patient states over the past month he has been receiving consistent alerts on his Apple watch that his heart rate is exceeding 100 and dropping below 40. Patient also mentions that his event monitor will not stay on.

## 2023-06-03 NOTE — Telephone Encounter (Signed)
Pt called to report that he has been noticing that his apple watch will alert him that he had "high rate" but says for example.. 10 min ago.. never when it is happening. Usually when he is sitting and watching TV... he has been having some light headedness.. no palpitations.   He wore his first Zio and it fell off at work and he lost it so they sent him another.. that one also fell off after 3 days... he has sent both in to be assessed by [facility]. He does work that makes him sweat a lot..   He has some low alerts as well but in the 40's when he was sleeping which I explained is a "typical" HR to possibly adjust his apple watch settings.   I will forward to Dr Odis Hollingshead for his review.

## 2023-06-04 NOTE — Telephone Encounter (Signed)
Please look into those Zio reports.  As long as asymptomatic monitor for now.  IF worsens come in or ER for more immediate evaluation.   Athziry Millican Busby, DO, [facility]

## 2023-06-04 NOTE — Telephone Encounter (Signed)
Pt says that his watch range has been 43 at night sleeping to 118 during the day.. he is feeling well but if he develops any worsening symptoms or new changes he will call EMS or have someone take him to the ED.   Will send staff message to the monitor nurses.. the 2nd Zio was just mailed back today.

## 2023-06-16 ENCOUNTER — Telehealth: Payer: Self-pay | Admitting: Cardiology

## 2023-06-16 ENCOUNTER — Telehealth: Payer: Self-pay

## 2023-06-16 ENCOUNTER — Encounter: Payer: Self-pay | Admitting: Student

## 2023-06-16 ENCOUNTER — Ambulatory Visit (INDEPENDENT_AMBULATORY_CARE_PROVIDER_SITE_OTHER): Payer: BC Managed Care – PPO | Admitting: Student

## 2023-06-16 VITALS — BP 104/64 | HR 62 | Ht 69.5 in | Wt 188.0 lb

## 2023-06-16 DIAGNOSIS — R42 Dizziness and giddiness: Secondary | ICD-10-CM

## 2023-06-16 NOTE — Progress Notes (Unsigned)
    SUBJECTIVE:   CHIEF COMPLAINT / HPI:   Episodic Dizziness Patient has an interesting history of seizures and marked bradycardia (resting rates as low as mid-low 30s). He comes in today reporting an episode on Sunday 11/10 where he was sitting on the couch watching football and suddenly had a sensation of profound light-headedness, "foggyness" that required him to lie down for fear he would lose consciousness. He has never actually lost consciousness during these episodes. Symptoms passed in just a few minutes. He had the wherewithal to check his BP and HR at the time and they were grossly normal. Denies any drug use in association with this event or any others.  He has been seen by both [place] and Dr. Odis Hollingshead for concerns for dizzy spells. Most recently was seen by Dr. Barb Merino on 05/04/23. At the time, he had an "episode" while leaning over, so there was some thought that this might be positional or BP mediated. Workup at the time, including EKG, orthostatic vitals, was benign. He's previously had his thyroid function and electrolytes tested without aberrancy. He recently wore a Zio patch which showed no tachydysrhythmias, though there was mention of a 2nd Degree Mobitz 1 (Wenkebach) block that is not appreciated on his EKGs.  There was some thought that this might be a medication side-effect to his clobazam (Onfi) which he takes for seizures. However, he notes that he has actually been out of this medication for about two weeks, so doubts it could have contributed to his episode on 11/10.  PERTINENT  PMH / PSH: Seizures on Clobazam, Resting Bradycardia   OBJECTIVE:   BP 104/64   Pulse 62   Ht 5' 9.5" (1.765 m)   Wt 188 lb (85.3 kg)   SpO2 98%   BMI 27.36 kg/m   Gen: Well appearing and NAD  HENT: No Cervical LAD or mass is appreciated Cardio: bradycardic, regular, no m/r/g Pulm: Normal WOB on RA, lungs clear to auscultation in all fields  Neuro: CN II-XII intact, gait normal, speech fluent,  strength and sensation intact throughout   ASSESSMENT/PLAN:   Dizziness Unusual presentation. By chart review has been undergoing work up off and on since 2020. I am reassured by the lack of tachy or brady dysrhythmias on his Zio patch, but the 2nd degree Mobitz 1 block on the Zio report that is absent from his EKG catches my attention. Could there be a rate-dependent block contributing? I also worry about a more global dysautonomia given his underlying neurologic condition and the wide-ranging and labile heart rates and detailed on the Zio report and prior notes.  I doubt that symptoms are related to his Onfi given he's been off it for two weeks, but with his seizure history, he should certainly be taking his AEDs---counseled on this. - Advised him to follow-up with Dr. Odis Hollingshead, with specific concern aimed at the 2nd degree block and possibility of a more profound rate-related conduction aberrancy contributing.  - Also needs to follow-up with Dr. Teresa Coombs, could consider referral to the neurology team's dysautonomia clinic if his cardiology workup remains unremarkable - Has a history of thyroglossal duct cyst, considered the possibility of a neck mass affecting cerebral perfusion, but there is no obvious mass to my exam and by review of images and ENT's note, this is rather small and distant from any major vessels.      Michael Mccoy, MD [facility]

## 2023-06-16 NOTE — Patient Instructions (Signed)
Michael Rogers,  I think you may have what we call "dysautonomia" contributing to your high and low heart rates which seem to be driving your symptoms. I think this is going to be best handled by Dr. Odis Hollingshead (you may benefit from more heart testing) and Dr. Teresa Coombs who can refer you to their dysautonomia clinic as they have access to specialized therapies that we do not.   Please call both of their offices to follow-up soon and I will be sure to send them my note and thoughts from the day.   Eliezer Mccoy, MD

## 2023-06-16 NOTE — Telephone Encounter (Signed)
After speaking with Dr. Odis Hollingshead, I looked to see if we could get pt in with DOD today but no available appts. Pt is not currently having acute symptoms but was told if they started again, to go to the ED to rule out stroke. Pt also advised to call PCP to see if they feel he needs to be seen d/t the lightheadedness. Pt denied current alcohol use and did not answer the question about any current drug use. Pt was agreeable with plan and very appreciative. Pt told to call back with any questions they may have and told to keep a BP log.

## 2023-06-16 NOTE — Telephone Encounter (Signed)
Pt c/o BP issue: STAT if pt c/o blurred vision, one-sided weakness or slurred speech  1. What are your last 5 BP readings? 148/91 Sunday at 11:15am, then 10 mins later 176/78  2. Are you having any other symptoms (ex. Dizziness, headache, blurred vision, passed out)? Lightheadedness, dizziness, some blurred vision and pt could just tell something was off.   3. What is your BP issue? Pt is concerned about this happening to him on Sunday while sitting down watching football, he stated he felt "cloudiness" in his chest and c ould just tell something was off but it now he's back to feeling fine. He just wanted to let his MD and nurse know what happened and to discuss further once called back. Please advise

## 2023-06-16 NOTE — Telephone Encounter (Signed)
Patient calls nurse line regarding concerns with BP and intermittent episodes of dizziness.   Patient reports elevated BP on Sunday. 148/91, recheck 10 minutes later was 176/78. During this time he was also experiencing a "constricting type" feeling in his chest. This resolved later on Sunday evening. Intermittent dizzy episodes have been occurring since August.   He denies current chest pain, shortness of breath, weakness, visual changes, facial drooping, speech difficulties.   He called his cardiologist today, they did not have availability. They asked that he call our office for follow up.   Scheduled same day appointment with Dr. Marisue Humble this afternoon.   Strict ED precautions discussed in the meantime.   Michael Prude, RN

## 2023-06-17 NOTE — Assessment & Plan Note (Signed)
Unusual presentation. By chart review has been undergoing work up off and on since 2020. I am reassured by the lack of tachy or brady dysrhythmias on his Zio patch, but the 2nd degree Mobitz 1 block on the Zio report that is absent from his EKG catches my attention. Could there be a rate-dependent block contributing? I also worry about a more global dysautonomia given his underlying neurologic condition and the wide-ranging and labile heart rates and detailed on the Zio report and prior notes.  I doubt that symptoms are related to his Onfi given he's been off it for two weeks, but with his seizure history, he should certainly be taking his AEDs---counseled on this. - Advised him to follow-up with Dr. Odis Hollingshead, with specific concern aimed at the 2nd degree block and possibility of a more profound rate-related conduction aberrancy contributing.  - Also needs to follow-up with Dr. Teresa Coombs, could consider referral to the neurology team's dysautonomia clinic if his cardiology workup remains unremarkable - Has a history of thyroglossal duct cyst, considered the possibility of a neck mass affecting cerebral perfusion, but there is no obvious mass to my exam and by review of images and ENT's note, this is rather small and distant from any major vessels.

## 2023-07-08 ENCOUNTER — Encounter: Payer: Self-pay | Admitting: Neurology

## 2023-07-08 ENCOUNTER — Ambulatory Visit (INDEPENDENT_AMBULATORY_CARE_PROVIDER_SITE_OTHER): Payer: BC Managed Care – PPO | Admitting: Neurology

## 2023-07-08 VITALS — BP 143/97 | HR 62 | Ht 69.5 in | Wt 189.5 lb

## 2023-07-08 DIAGNOSIS — G253 Myoclonus: Secondary | ICD-10-CM

## 2023-07-08 DIAGNOSIS — R569 Unspecified convulsions: Secondary | ICD-10-CM

## 2023-07-08 NOTE — Progress Notes (Signed)
[facility]  PATIENT: Michael Rogers DOB: [birth year]  REQUESTING CLINICIAN: [name], MD HISTORY FROM: Patient  REASON FOR VISIT: Seizure like activity    HISTORICAL  CHIEF COMPLAINT:  Chief Complaint  Patient presents with   Seizures    Rm13, alone, [id number] remember when last episode was. Stopped onfi because he ran out. Pt reported dizziness   INTERVAL HISTORY 07/08/2023:  Patient presents today for follow-up, last visit was in June and since then he did complete the EMU admission in August, EEG was normal, no epileptiform discharges and no other abnormality.  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  Since discharge from the hospital he has not had any additional event.  Actually he ran out of clobazam back in September and for the past 3 months has not been taking the medication.  He reports during this time experiencing episodes of dizziness that he describes as room spinning sensation and lightheadedness.  He has been working with cardiology and PCP for the diagnosis.  He reports experiencing bradycardia, actually while he was in the EMU his heart rate was noted to be in the 30s.  He was asymptomatic at that time.   INTERVAL HISTORY 01/22/2023:  Patient presents today for follow-up, he is alone.  Last visit was in March and since then he has been doing okay.  His Keppra was not approved by insurance, and the clobazam 10 mg twice daily has caused side effect of somnolence.  Currently he is on clobazam 10 mg nightly, denies any additional side effects, and report 3 weeks ago he woke up with urinary incontinence and that he does not have any other event suspicious for seizures.  He is compliant with his medication.   The EMU had contacted him to schedule the appointment but due to work restriction he was not able to get admitted.  He is planning to do the EMU admission in July    INTERVAL HISTORY 10/09/2022:  Michael Rogers presents today for  follow-up, last visit was in November, at that time we had planned to start him on Depakote 500 mg twice daily.  Due to possible side effects, he has not started the medicine.  He reports he has been doing well until 2 weeks ago when he woke up feeling very tired.  He does not recall biting his tongue. He has completed a sleep deprived EEG and it was normal.   INTERVAL HISTORY 06/11/22 Patient presents today for follow-up, last visit was in July, since then he reports that he has been sleeping alone, therefore he is unsure if he still having these events but he woke up a couple times with tongue biting and feeling tired the next day.  He denies any events during the daytime. He was not able to complete his ambulatory EEG, his routine EEG was normal.   HISTORY OF PRESENT ILLNESS:  This is a 37 year old gentleman past medical history of asthma and bradycardia who is presenting with seizure-like activity.  Patient reports episode of jerk like movement during sleep, stated that he does not remember them, he is not aware of it until wife tell him about the episodes.  I was able to spoke with wife over the phone, and she reports that he is having these episodes about once a month. These episodes usually involve the entire body,, wife reports these episodes will wake her up from sleep and last about 5 seconds.  Afterward, patient will go to sleep.  Patient reports at times he woke up with tongue biting, lateral side of his tongue and sometimes he will wake up feeling very tired.  He has a history of dropping things in the morning, denies any staring spells.  He denies any previous history of seizures.   OTHER MEDICAL CONDITIONS: Asthma, Bradycardia    REVIEW OF SYSTEMS: Full 14 system review of systems performed and negative with exception of: as noted in the HPI   ALLERGIES: Allergies  Allergen Reactions   Amoxil [Amoxicillin] Nausea Only    Dizziness Lightheadedness   Dairycare [Bacid] Other (See  Comments)    Unknown reaction   Dust Mite Extract Itching    Sneezing & Itchiness In Eyes + pollen and grass. .   Latex Rash        Other Other (See Comments)    Patient prefers to take liquid instead of tablets or capsule.   Pork-Derived Products Nausea Only    HOME MEDICATIONS: Outpatient Medications Prior to Visit  Medication Sig Dispense Refill   cloBAZam (ONFI) 10 MG tablet Take 1 tablet (10 mg total) by mouth at bedtime. (Patient not taking: Reported on 07/08/2023) 30 tablet 5   fluticasone (FLONASE) 50 MCG/ACT nasal spray Place 2 sprays into both nostrils daily. (Patient not taking: Reported on 07/08/2023) 16 g 6   No facility-administered medications prior to visit.    PAST MEDICAL HISTORY: Past Medical History:  Diagnosis Date   Allergy    Asthma    Chest pain    COVID-19 virus infection 02/08/2021   H. pylori infection    Headache 09/21/2018   Prostatitis 05/29/2017   Puncture wound of right foot 02/28/2016    PAST SURGICAL HISTORY: Past Surgical History:  Procedure Laterality Date   COLONOSCOPY  2018   MOUTH SURGERY     ROOT CANAL      FAMILY HISTORY: Family History  Problem Relation Age of Onset   Kidney disease Father    Hypertension Paternal Grandmother    Heart disease Paternal Uncle    Diabetes Maternal Aunt    Colon cancer Neg Hx    Stomach cancer Neg Hx     SOCIAL HISTORY: Social History   Socioeconomic History   Marital status: Significant Other    Spouse name: Not on file   Number of children: 1   Years of education: Not on file   Highest education level: Not on file  Occupational History   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     History of incarceration in 2012.   Social Determinants of Health   Financial Resource Strain: Not on file  Food Insecurity: No Food Insecurity (04/14/2023)   Hunger Vital Sign    Worried About Running Out of Food  in the Last Year: Never true    Ran Out of Food in the Last Year: Never true  Recent Concern: Food Insecurity - Food Insecurity Present (03/23/2023)   Hunger Vital Sign    Worried About Running Out of Food in the Last Year: Sometimes true    Ran Out of Food in the Last Year: Sometimes true  Transportation Needs: No Transportation Needs (04/14/2023)   PRAPARE - Administrator, Civil Service (Medical): No    Lack of Transportation (Non-Medical): No  Physical Activity: Not on file  Stress: Not on file  Social Connections: Unknown (12/17/2021)   Received from [facility]   Social Network    Social Network: Not on file  Intimate Partner Violence: Not At Risk (04/14/2023)   Humiliation, Afraid, Rape, and Kick questionnaire    Fear of Current or Ex-Partner: No    Emotionally Abused: No    Physically Abused: No    Sexually Abused: No    PHYSICAL EXAM  GENERAL EXAM/CONSTITUTIONAL: Vitals:  Vitals:   07/08/23 1511  BP: (!) 143/97  Pulse: 62  Weight: 189 lb 8 oz (86 kg)  Height: 5' 9.5" (1.765 m)    Body mass index is 27.58 kg/m. Wt Readings from Last 3 Encounters:  07/08/23 189 lb 8 oz (86 kg)  06/16/23 188 lb (85.3 kg)  05/04/23 185 lb 6 oz (84.1 kg)   Patient is in no distress; well developed, nourished and groomed; neck is supple  MUSCULOSKELETAL: Gait, strength, tone, movements noted in Neurologic exam below  NEUROLOGIC: MENTAL STATUS:      No data  to display         awake, alert, oriented to person, place and time recent and remote memory intact normal attention and concentration language fluent, comprehension intact, naming intact fund of knowledge appropriate  CRANIAL NERVE:  2nd, 3rd, 4th, 6th - visual fields full to confrontation, extraocular muscles intact, no nystagmus 5th - facial sensation symmetric 7th - facial strength symmetric 8th - hearing intact 9th - palate elevates symmetrically, uvula midline 11th - shoulder shrug symmetric 12th - tongue protrusion midline  MOTOR:  normal bulk and tone, full strength in the BUE, BLE  SENSORY:  normal and symmetric to light touch  COORDINATION:  finger-nose-finger, fine finger movements normal  REFLEXES:  deep tendon reflexes present and symmetric  GAIT/STATION:  normal   DIAGNOSTIC DATA (LABS, IMAGING, TESTING) - I reviewed patient records, labs, notes, testing and imaging myself where available.  Lab Results  Component Value Date   WBC 5.7 03/23/2023   HGB 14.2 03/23/2023   HCT 41.4 03/23/2023   MCV 100.2 (H) 03/23/2023   PLT 206 03/23/2023      Component Value Date/Time   NA 136 03/25/2023 0444   NA 139 02/14/2022 1108   K 3.6 03/25/2023 0444   CL 102 03/25/2023 0444   CO2 25 03/25/2023 0444   GLUCOSE 91 03/25/2023 0444   BUN 9 03/25/2023 0444   BUN 14 02/14/2022 1108   CREATININE 0.92 03/25/2023 0444   CALCIUM 9.3 03/25/2023 0444   PROT 6.1 (L) 03/23/2023 1201   PROT 7.5 02/14/2022 1108   ALBUMIN 3.8 03/23/2023 1201   ALBUMIN 5.2 (H) 02/14/2022 1108   AST 18 03/23/2023 1201   ALT 22 03/23/2023 1201   ALKPHOS 46 03/23/2023 1201   BILITOT 0.3 03/23/2023 1201   BILITOT 0.6 02/14/2022 1108   GFRNONAA >60 03/25/2023 0444   GFRAA 125 09/21/2018 1124  No results found for: "CHOL", "HDL", "LDLCALC", "LDLDIRECT", "TRIG", "CHOLHDL" Lab Results  Component Value Date   HGBA1C 5.2 09/21/2018   Lab Results  Component Value Date   VITAMINB12  399 02/14/2022   Lab Results  Component Value Date   TSH 1.680 04/10/2023    MRI Brain 02/24/22 Unremarkable appearance of the brain.  Routine EEG 03/05/22 Normal   Sleep deprived EEG 07/30/22 This is a normal sleep deprived EEG recording in the waking and sleeping state. No evidence of interictal epileptiform discharges. Normal EEGs, however, do not rule out epilepsy.    EMU Admission 03/27/2023 This study is within normal limits. The excessive beta activity seen in the background is most likely due to the effect of benzodiazepine and is a benign EEG pattern.No seizures or epileptiform discharges were seen throughout the recording.   During sleep multiple episodes of subtle head and shoulder jerking were noted without concomitant EEG change to suggest seizure.  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  ASSESSMENT AND PLAN  37 y.o. year old male with history of bradycardia and asthma who is presenting for follow up of episodes of jerk like movements, urinary incontinence and tongue biting during sleep.  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 He has been off Clobazam for the past 2 months, no additional events. His previous EEGs have all been normal. Plan will be to keep him off Clobazam and to continue monitor him, but I have also gave him a prescription to take if and when he starts having events again. I will see him in 6 months for follow up. Advised him to continue following up with PCP and Cardiology for evaluation of his bradycardia.    1. Seizure-like activity (HCC)   2. Sleep myoclonus      Patient Instructions  Hold Clobazam for now  Continue to follow up with your doctors  Follow up in 6 months but please call us if you experience additional events.   No orders of the defined types were placed in this encounter.   Meds ordered this encounter   Medications   cloBAZam (ONFI) 10 MG tablet    Sig: Take 1 tablet (10 mg total) by mouth daily.    Dispense:  30 tablet    Refill:  0    Return in about 6 months (around 01/06/2024).     Windell Norfolk, MD 07/11/2023, 11:33 AM  [facility] [address], [address] [place], [place] [address] [phone]

## 2023-07-09 MED ORDER — CLOBAZAM 10 MG PO TABS: 10.0000 mg | ORAL_TABLET | Freq: Every day | ORAL | 0 refills | Status: DC

## 2023-07-11 NOTE — Patient Instructions (Signed)
Hold Clobazam for now  Continue to follow up with your doctors  Follow up in 6 months but please call us if you experience additional events.

## 2023-08-03 ENCOUNTER — Ambulatory Visit: Payer: BC Managed Care – PPO | Admitting: Neurology

## 2023-08-25 ENCOUNTER — Encounter: Payer: Self-pay | Admitting: Cardiology

## 2023-08-25 ENCOUNTER — Ambulatory Visit: Payer: Medicaid Other | Attending: Cardiology | Admitting: Cardiology

## 2023-08-25 VITALS — BP 128/78 | HR 55 | Resp 16 | Ht 69.0 in | Wt 196.6 lb

## 2023-08-25 DIAGNOSIS — R001 Bradycardia, unspecified: Secondary | ICD-10-CM

## 2023-08-25 NOTE — Patient Instructions (Signed)
Medication Instructions:  Your physician recommends that you continue on your current medications as directed. Please refer to the Current Medication list given to you today.  *If you need a refill on your cardiac medications before your next appointment, please call your pharmacy*  Lab Work: None ordered today. If you have labs (blood work) drawn today and your tests are completely normal, you will receive your results only by: MyChart Message (if you have MyChart) OR A paper copy in the mail If you have any lab test that is abnormal or we need to change your treatment, we will call you to review the results.  Testing/Procedures: None ordered today.  Follow-Up: At [facility], you and your health needs are our priority.  As part of our continuing mission to provide you with exceptional heart care, we have created designated Provider Care Teams.  These Care Teams include your primary Cardiologist (physician) and Advanced Practice Providers (APPs -  Physician Assistants and Nurse Practitioners) who all work together to provide you with the care you need, when you need it.  We recommend signing up for the patient portal called "MyChart".  Sign up information is provided on this After Visit Summary.  MyChart is used to connect with patients for Virtual Visits (Telemedicine).  Patients are able to view lab/test results, encounter notes, upcoming appointments, etc.  Non-urgent messages can be sent to your provider as well.   To learn more about what you can do with MyChart, go to [email or web address].    Your next appointment:   As needed  The format for your next appointment:   In Person  Provider:   Tessa Lerner, DO {

## 2023-08-25 NOTE — Progress Notes (Signed)
Cardiology Office Note:  .   Date:  08/25/2023  ID:  Michael Rogers, DOB 01/04/1986, MRN [id number] PCP:  Michael Eland, MD  Former Cardiology Providers: Altamese Newcastle, APRN, FNP-C  [facility] Providers Cardiologist:  Tessa Lerner, DO , [facility] (established care 05/09/2020) Electrophysiologist:  None  Click to update primary MD,subspecialty MD or APP then REFRESH:1}    Chief Complaint  Patient presents with   Results   Follow-up    History of Present Illness: .   Michael Rogers is a 38 y.o.  male whose past medical history and cardiovascular risk factors includes: Asthma, former tobacco use, history of cocaine use.   Patient was referred to practice in October 2021 for evaluation of bradycardia.  At that time patient did undergo cardiovascular testing and did well.  During his recent hospitalization in August 2024 he is being evaluated for possible seizures.  During his EEG monitoring he was noted to have nocturnal bradycardia and was recommended to follow-up with cardiology.  Patient has had history of nocturnal bradycardia and he was reassured that this is benign and does not need device therapy.  He was recommended to undergo sleep study to make sure he does not have sleep apnea.  But this is still pending.  At the last office visit the shared decision was to proceed with a cardiac monitor to evaluate for heart blocks/pauses.  Since last office visit he denies anginal chest pain, heart failure symptoms, near-syncope, syncopal events.  He usually sleeps between 11 PM to 6:30 AM.   Review of Systems: .   Review of Systems  Cardiovascular:  Negative for chest pain, claudication, irregular heartbeat, leg swelling, near-syncope, orthopnea, palpitations, paroxysmal nocturnal dyspnea and syncope.  Respiratory:  Negative for shortness of breath.   Hematologic/Lymphatic: Negative for bleeding problem.    Studies Reviewed:   EKG: 04/20/2023: Sinus rhythm, 60 bpm, without  underlying ischemic injury pattern.   Echocardiogram: 10/06/2018: LVEF 55-60%, cavity size normal, normal diastolic function, no regional wall motion abnormalities, RVSP 19 mmHg.,  No significant valvular heart disease.   Stress Testing:  Exercise Treadmill Stress Test 10/20/2018: Indication: Chest pain, SOB, Fatigue The patient exercised on Bruce protocol for  08:18 min. Patient achieved  10.16 METS and reached HR  164 bpm, which is   87% of maximum age-predicted HR.  Stress test terminated due to dyspnea and dizziness.   Exercise capacity was normal. HR Response to Exercise: Appropriate. BP Response to Exercise: Normal resting BP- appropriate response. Chest Pain: none. Arrhythmias: none. Resting EKG demonstrates Normal sinus rhythm. ST Changes: With peak exercise there was no ST-T changes of ischemia.   Overall Impression: Normal stress test. Recommendations: Continue primary/secondary prevention.  Cardiac monitor: Cardiac monitor (Zio Patch): September 2024 Dominant rhythm sinus. Heart rate 32-169 bpm. Avg HR 79 bpm. Minimum heart rate: 10//2024 at 4:56 AM, sinus bradycardia, 32 bpm, second-degree type I physiology. No atrial fibrillation detected during the monitoring period. No supraventricular tachycardia, ventricular tachycardia, high grade AV block, pauses (3 seconds or longer). Total supraventricular ectopic burden <1%. Total ventricular ectopic burden <1%. Patient triggered events: 19. Underlying rhythm demonstrated sinus without ectopy/arrhythmia.   RADIOLOGY: NA  Risk Assessment/Calculations:   NA   Labs:       Latest Ref Rng & Units 03/23/2023   12:01 PM 09/21/2018   11:24 AM 09/28/2017    9:27 PM  CBC  WBC 4.0 - 10.5 K/uL 5.7  6.7  12.7   Hemoglobin 13.0 -  17.0 g/dL 16.1  09.6  04.5   Hematocrit 39.0 - 52.0 % 41.4  45.2  45.4   Platelets 150 - 400 K/uL 206  249  226        Latest Ref Rng & Units 03/25/2023    4:44 AM 03/23/2023   12:01 PM 02/14/2022    11:08 AM  BMP  Glucose 70 - 99 mg/dL 91  87  93   BUN 6 - 20 mg/dL 9  8  14    Creatinine 0.61 - 1.24 mg/dL 4.09  8.11  9.14   BUN/Creat Ratio 9 - 20   14   Sodium 135 - 145 mmol/L 136  137  139   Potassium 3.5 - 5.1 mmol/L 3.6  3.8  4.5   Chloride 98 - 111 mmol/L 102  103  99   CO2 22 - 32 mmol/L 25  27  26    Calcium 8.9 - 10.3 mg/dL 9.3  8.9  78.2       Latest Ref Rng & Units 03/25/2023    4:44 AM 03/23/2023   12:01 PM 02/14/2022   11:08 AM  CMP  Glucose 70 - 99 mg/dL 91  87  93   BUN 6 - 20 mg/dL 9  8  14    Creatinine 0.61 - 1.24 mg/dL 9.56  2.13  0.86   Sodium 135 - 145 mmol/L 136  137  139   Potassium 3.5 - 5.1 mmol/L 3.6  3.8  4.5   Chloride 98 - 111 mmol/L 102  103  99   CO2 22 - 32 mmol/L 25  27  26    Calcium 8.9 - 10.3 mg/dL 9.3  8.9  57.8   Total Protein 6.5 - 8.1 g/dL  6.1  7.5   Total Bilirubin 0.3 - 1.2 mg/dL  0.3  0.6   Alkaline Phos 38 - 126 U/L  46  68   AST 15 - 41 U/L  18  25   ALT 0 - 44 U/L  22  40     No results found for: "CHOL", "HDL", "LDLCALC", "LDLDIRECT", "TRIG", "CHOLHDL" No results for input(s): "LIPOA" in the last 8760 hours. No components found for: "NTPROBNP" No results for input(s): "PROBNP" in the last 8760 hours. Recent Labs    04/10/23 1228  TSH 1.680    Physical Exam:    Today's Vitals   08/25/23 0804  BP: 128/78  Pulse: (!) 55  Resp: 16  SpO2: 98%  Weight: 196 lb 9.6 oz (89.2 kg)  Height: 5\' 9"  (1.753 m)   Body mass index is 29.03 kg/m. Wt Readings from Last 3 Encounters:  08/25/23 196 lb 9.6 oz (89.2 kg)  07/08/23 189 lb 8 oz (86 kg)  06/16/23 188 lb (85.3 kg)    Physical Exam  Constitutional: No distress.  Age appropriate, hemodynamically stable.   Neck: No JVD present.  Cardiovascular: Regular rhythm, S1 normal, S2 normal, intact distal pulses and normal pulses. Bradycardia present. Exam reveals no gallop, no S3 and no S4.  No murmur heard. Pulmonary/Chest: Effort normal and breath sounds normal. No stridor. He  has no wheezes. He has no rales.  Abdominal: Soft. Bowel sounds are normal. He exhibits no distension. There is no abdominal tenderness.  Musculoskeletal:        General: No edema.     Cervical back: Neck supple.  Neurological: He is alert and oriented to person, place, and time. He has intact cranial nerves (2-12).  Skin: Skin is  warm and moist.     Impression & Recommendation(s):  Impression:   ICD-10-CM   1. Bradycardia  R00.1        Recommendation(s):  Bradycardia During his EEG was noted to have nocturnal episodes of bradycardia. He was reassured that nocturnal bradycardia can be monitored for now and recommended he discuss w/ PCP and consider sleep study.  However, he was quite concerned that shared decision was to proceed w/ a Zio patch back in Sept 2024.  Results reviewed - no heart block or sustained arrhythmia. He did have episodes of second degree type I AV block (while asleep).  He still has not had a sleep study - it was encouraged at today's visit.  No additional work up warranted at this time.   Orders Placed:  No orders of the defined types were placed in this encounter.  Final Medication List:   No orders of the defined types were placed in this encounter.   Medications Discontinued During This Encounter  Medication Reason   cloBAZam (ONFI) 10 MG tablet Patient Preference    No current outpatient medications on file.  Consent:   NA  Disposition:   As needed.   His questions and concerns were addressed to his satisfaction. He voices understanding of the recommendations provided during this encounter.    Signed, Tessa Lerner, DO, [facility]  [facility]  [address] [place], [place] [address] 08/25/2023 11:50 AM

## 2023-09-08 ENCOUNTER — Ambulatory Visit: Payer: BC Managed Care – PPO | Admitting: Cardiology

## 2023-10-16 ENCOUNTER — Ambulatory Visit: Admitting: Family Medicine

## 2023-11-09 ENCOUNTER — Telehealth: Payer: Self-pay | Admitting: Family Medicine

## 2023-11-09 NOTE — Telephone Encounter (Addendum)
**   AFTER HOURS PAGE **  Patient reports he noticed red bumps on both of his sides that extended down to his waist that appeared suddenly this afternoon.  Nonpainful but states his wife tried to put hydrocortisone cream on in the event became itchy.  He thought some of them had "head" and he attempted to pop them without relief.  Denies concurrent shortness of breath or chest pain.  Notes that he did start a new body wash yesterday, reports it was the same brand he has used before but a different scent.  Denies any other new exposures or foods.  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  Patient is wondering if he can take Benadryl for symptomatic relief but thought it may cause his seizure symptoms.  Not currently on any medications per neurology but given prescription just in case symptoms recur.  Unsure when his last seizure was as they occurred when he was sleeping.  Discussed patient can trial oral second-generation antihistamine such as cetirizine loratadine for itching.  Recommended to avoid Benadryl given lowering of seizure threshold.  Recommended avoiding new body wash as this could be inciting trigger for rash.  ED return precautions discussed.  No labored breathing over the phone, low concern for anaphylaxis.  Scheduled with Dr. Jena Gauss at 10:30 AM on 4/8 for further evaluation.  Patient also notes he has been having headaches over the past week that only last a few seconds but can be sharp.  Recommend he can discuss further with physician at scheduled appointment if time allows.  Will forward to Dr. Jena Gauss.

## 2023-11-10 ENCOUNTER — Ambulatory Visit: Payer: Self-pay | Admitting: Family Medicine

## 2023-11-10 VITALS — BP 124/80 | HR 46 | Wt 197.8 lb

## 2023-11-10 DIAGNOSIS — R519 Headache, unspecified: Secondary | ICD-10-CM | POA: Diagnosis not present

## 2023-11-10 DIAGNOSIS — J4520 Mild intermittent asthma, uncomplicated: Secondary | ICD-10-CM | POA: Diagnosis not present

## 2023-11-10 DIAGNOSIS — R21 Rash and other nonspecific skin eruption: Secondary | ICD-10-CM | POA: Diagnosis not present

## 2023-11-10 MED ORDER — TRIAMCINOLONE ACETONIDE 0.1 % EX CREA: 1.0000 | TOPICAL_CREAM | Freq: Two times a day (BID) | CUTANEOUS | 0 refills | Status: AC

## 2023-11-10 MED ORDER — ALBUTEROL SULFATE HFA 108 (90 BASE) MCG/ACT IN AERS: 2.0000 | INHALATION_SPRAY | Freq: Four times a day (QID) | RESPIRATORY_TRACT | 2 refills | Status: AC | PRN

## 2023-11-10 NOTE — Patient Instructions (Signed)
 It was nice seeing you today.  It seems you have allergic skin reaction.  I have sent in steroid cream to your pharmacy. Please use Eucerin cream in addition to this.  Follow up soon if there is no improvement. Also schedule f/u as discussed for your asthma and pneumonia shot.

## 2023-11-10 NOTE — Assessment & Plan Note (Addendum)
 Hx of tobacco use PCV20 vaccination discussed He prefers to schedule this in the future I sent in albuterol prn for him F/U as needed

## 2023-11-10 NOTE — Assessment & Plan Note (Addendum)
 Currently asymptomatic ?? Stress headache  Monitor closely for now

## 2023-11-10 NOTE — Assessment & Plan Note (Addendum)
 Likely skin allergy reaction May be due to recent change in his [place] Trial Triamcinolone with Eucerin cream discussed. F/U soon if there is no improvement Consider allergy testing in the future

## 2023-11-10 NOTE — Progress Notes (Signed)
    SUBJECTIVE:   CHIEF COMPLAINT / HPI:   Rash This is a new problem. Episode onset: Noticed rash on the right  side of his chest last night. Location: Rash is mostly on his chest - on both sides. Rash characteristics: Feels like bumps on his skin. Associated with: Denies insect bite, no change in medication or diet. He uses a new body wash, same brand but different scent few days ago. Pertinent negatives include no cough, fever, shortness of breath or vomiting. (Rash is itchy) Treatments tried: Wife put her hydrocortisone on which helped a bit.   Headache: He had pain in his temple B/L 1 week ago, which was sudden, lasting a few seconds, and self-resolves. No further episodes since then. He endorsed hx of intermittent headaches in the past. No head trauma or injury.   Asthma: No acute concerns.   PERTINENT  PMH / PSH: PMHx reviewed  OBJECTIVE:   BP 124/80   Pulse (!) 46   Wt 197 lb 12.8 oz (89.7 kg)   BMI 29.21 kg/m   Physical Exam Vitals reviewed.  Cardiovascular:     Rate and Rhythm: Normal rate and regular rhythm.     Pulses: Normal pulses.     Heart sounds: Normal heart sounds.  Pulmonary:     Effort: Pulmonary effort is normal. No respiratory distress.     Breath sounds: Normal breath sounds. No wheezing.  Skin:    Comments: Multiple, Scattered mildly erythematous papular lesion on his sides, abdomen, chest and back with excoriation marks Chaperone Clabe Seal Leggette)  Neurological:     General: No focal deficit present.  Psychiatric:        Mood and Affect: Mood normal.      ASSESSMENT/PLAN:   Assessment & Plan Skin rash Likely skin allergy reaction May be due to recent change in his [place] Trial Triamcinolone with Eucerin cream discussed. F/U soon if there is no improvement Consider allergy testing in the future  Nonintractable headache, unspecified chronicity pattern, unspecified headache type Currently asymptomatic ?? Stress headache  Monitor  closely for now Mild intermittent asthma without complication Hx of tobacco use PCV20 vaccination discussed He prefers to schedule this in the future I sent in albuterol prn for him F/U as needed     Janit Pagan, MD [facility]

## 2024-01-08 ENCOUNTER — Telehealth: Payer: Self-pay

## 2024-01-08 NOTE — Telephone Encounter (Signed)
 Patient calls nurse line reporting neck pain.   He reports the pain is on the left side of his neck, around his collar bone and shoulder. He reports the pain has been present for the last 5 days.  He denies any trauma to the area or "sleeping" wrong.  He reports the pain comes and goes. He reports he notices it more when he goes from "sit to stand" or "stand to sit."  Patient scheduled for Monday for evaluation.   Conservatives measures discussed with patient.

## 2024-01-09 ENCOUNTER — Ambulatory Visit
Admission: EM | Admit: 2024-01-09 | Discharge: 2024-01-09 | Disposition: A | Discharge status: Home / Self Care | Payer: Self-pay | Attending: Internal Medicine | Admitting: Internal Medicine

## 2024-01-09 ENCOUNTER — Encounter: Payer: Self-pay | Admitting: Emergency Medicine

## 2024-01-09 ENCOUNTER — Telehealth: Payer: Self-pay | Admitting: Student

## 2024-01-09 ENCOUNTER — Ambulatory Visit (INDEPENDENT_AMBULATORY_CARE_PROVIDER_SITE_OTHER): Payer: Self-pay

## 2024-01-09 ENCOUNTER — Other Ambulatory Visit: Payer: Self-pay

## 2024-01-09 DIAGNOSIS — M62838 Other muscle spasm: Secondary | ICD-10-CM

## 2024-01-09 MED ORDER — CYCLOBENZAPRINE HCL 10 MG PO TABS: 10.0000 mg | ORAL_TABLET | Freq: Three times a day (TID) | ORAL | 0 refills | Status: AC | PRN

## 2024-01-09 MED ORDER — DEXAMETHASONE SODIUM PHOSPHATE 10 MG/ML IJ SOLN
10.0000 mg | Freq: Once | INTRAMUSCULAR | Status: AC
  Administered 2024-01-09: 10 mg via INTRAMUSCULAR

## 2024-01-09 MED ORDER — KETOROLAC TROMETHAMINE 30 MG/ML IJ SOLN
30.0000 mg | Freq: Once | INTRAMUSCULAR | Status: AC
  Administered 2024-01-09: 30 mg via INTRAMUSCULAR

## 2024-01-09 NOTE — ED Triage Notes (Signed)
 Pt sts severe left sided neck pain worse with movement x 4 days; denies obvious injury

## 2024-01-09 NOTE — ED Provider Notes (Signed)
 [facility]    CSN: [id number] Arrival date & time: 01/09/24  0845      History   Chief Complaint Chief Complaint  Patient presents with   Neck Pain    HPI Michael Rogers is a 38 y.o. male.   38 year old male who presents urgent care with complaints of severe neck pain and spasms.  He reports that this started on Monday but has progressively gotten much worse.  The last 2 days he has been in excruciating pain.  He reports that any movements of his right arm cause severe pain.  He does relate that on Monday he had a pain in his chest and he spent 20 minutes with his chin against his chest and shortly after he began having neck pain.  The the symptom of chest pain has resolved and has not recurred.  He has been doing heating, rolling and stretching.  He has been taking over-the-counter medication without relief.  He does have a history of degenerative disc and had a CT and MRI done in 2022 showing C5, C6 and C7 stenosis.  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  He is scheduled on Monday to have a CT of the soft tissue.  He did contact his physician today and he was told to go to urgent care for further evaluation.  He is having some numbness and tingling that is subjective, he reports weakness in his hand but due to causing pain in his neck.  Denies fevers, chills, cough, shortness of breath, visual changes, recurrent chest pain, headache, dizziness.  Patient does report that he feels he would be more comfortable in a neck brace.   Neck Pain Associated symptoms: numbness (right arm) and weakness (right arm)   Associated symptoms: no chest pain and no fever     Past Medical History:  Diagnosis Date   Allergy    Asthma    Chest pain    COVID-19 virus infection 02/08/2021   H. 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 pylori infection 01/21/2012    Past Surgical History:  Procedure Laterality Date   COLONOSCOPY  2018   MOUTH SURGERY     ROOT CANAL         Home Medications    Prior to Admission medications   Medication Sig Start Date End Date Taking? Authorizing Provider  cyclobenzaprine (FLEXERIL) 10 MG tablet Take 1 tablet (10 mg total) by mouth every 8 (eight) hours as needed for muscle spasms. 01/09/24  Yes Hamilton Marinello A, PA-C  albuterol  (VENTOLIN  HFA) 108 (90 Base) MCG/ACT inhaler Inhale 2 puffs into the lungs every 6 (six) hours as needed for wheezing or shortness of breath. 11/10/23   Arn Lane, MD  triamcinolone  cream (KENALOG )  0.1 % Apply 1 Application topically 2 (two) times daily. 11/10/23   Arn Lane, MD    Family History Family History  Problem Relation Age of Onset   Kidney disease Father    Hypertension Paternal Grandmother    Heart disease Paternal Uncle    Diabetes Maternal Aunt    Colon cancer Neg Hx    Stomach cancer Neg Hx     Social History Social History   Tobacco Use   Smoking status: Former    Current packs/day: 0.00    Average packs/day: 0.5 packs/day for 10.0 years (5.0 ttl pk-yrs)    Types: Cigarettes    Start date: 03/17/2013    Quit date: 09/28/2013    Years since quitting: 10.2    Passive exposure: Past   Smokeless tobacco: Never  Vaping Use   Vaping status: Never Used  Substance Use Topics    Alcohol use: Yes    Comment: rarely    Drug use: Yes    Types: Marijuana     Allergies   Amoxil  [amoxicillin ], Dairycare [bacid], Dust mite extract, Latex, Other, and Pork-derived products   Review of Systems Review of Systems  Constitutional:  Negative for chills and fever.  HENT:  Negative for ear pain and sore throat.   Eyes:  Negative for pain and visual disturbance.  Respiratory:  Negative for cough and shortness of breath.   Cardiovascular:  Negative for chest pain and palpitations.  Gastrointestinal:  Negative for abdominal pain and vomiting.  Genitourinary:  Negative for dysuria and hematuria.  Musculoskeletal:  Positive for neck pain and neck stiffness. Negative for arthralgias and back pain.  Skin:  Negative for color change and rash.  Neurological:  Positive for weakness (right arm) and numbness (right arm). Negative for seizures and syncope.  All other systems reviewed and are negative.    Physical Exam Triage Vital Signs ED Triage Vitals [01/09/24 0907]  Encounter Vitals Group     BP 135/89     Systolic BP Percentile      Diastolic BP Percentile      Pulse Rate (!) 59     Resp 18     Temp 98.1 F (36.7 C)     Temp Source Oral     SpO2 97 %     Weight      Height      Head Circumference      Peak Flow      Pain Score 10     Pain Loc      Pain Education      Exclude from Growth Chart    No data found.  Updated Vital Signs BP 135/89 (BP Location: Left Arm)   Pulse (!) 59   Temp 98.1 F (36.7 C) (Oral)   Resp 18   SpO2 97%   Visual Acuity Right Eye Distance:   Left Eye Distance:   Bilateral Distance:    Right Eye Near:   Left Eye Near:    Bilateral Near:     Physical Exam Vitals and nursing note reviewed.  Constitutional:      General: He is not in acute distress.    Appearance: He is well-developed.  HENT:     Head: Normocephalic and atraumatic.  Eyes:     Conjunctiva/sclera: Conjunctivae normal.  Neck:     Vascular: No carotid  bruit.     Trachea: Trachea normal.     Comments: Patient turns whole body in order to look and left  or right directions. Cardiovascular:     Rate and Rhythm: Normal rate and regular rhythm.     Heart sounds: No murmur heard. Pulmonary:     Effort: Pulmonary effort is normal. No respiratory distress.     Breath sounds: Normal breath sounds.  Abdominal:     Palpations: Abdomen is soft.     Tenderness: There is no abdominal tenderness.  Musculoskeletal:        General: No swelling.     Cervical back: Neck supple. Torticollis and tenderness (even with light palpitation) present. No edema, erythema or crepitus. Pain with movement and muscular tenderness present. No spinous process tenderness. Decreased range of motion.  Lymphadenopathy:     Cervical:     Right cervical: No superficial cervical adenopathy.    Left cervical: No superficial cervical adenopathy.  Skin:    General: Skin is warm and dry.     Capillary Refill: Capillary refill takes less than 2 seconds.  Neurological:     Mental Status: He is alert.  Psychiatric:        Mood and Affect: Mood normal.      UC Treatments / Results  Labs (all labs ordered are listed, but only abnormal results are displayed) Labs Reviewed - No data to display  EKG   Radiology DG Cervical Spine Complete Result Date: 01/09/2024 CLINICAL DATA:  Neck pain. EXAM: CERVICAL SPINE - COMPLETE 4+ VIEW COMPARISON:  01/08/2021. FINDINGS: There is reversal of cervical lordosis, which may be on the basis of positioning or due to muscle spasm, No spondylolisthesis. Vertebral body heights are maintained. No fracture or destructive lesion. The C1 lateral masses are symmetrically positioned around the odontoid process. Intervertebral disc heights are maintained. Minimal marginal osteophyte formation at C6-7 level. Ligamentum nuchae calcification noted. Prevertebral soft tissues within normal limits. IMPRESSION: No acute osseous abnormality of the cervical  spine. Electronically Signed   By: Beula Brunswick M.D.   On: 01/09/2024 09:59    Procedures Procedures (including critical care time)  Medications Ordered in UC Medications  ketorolac  (TORADOL ) 30 MG/ML injection 30 mg (30 mg Intramuscular Given 01/09/24 0941)  dexamethasone (DECADRON) injection 10 mg (10 mg Intramuscular Given 01/09/24 0941)    Initial Impression / Assessment and Plan / UC Course  I have reviewed the triage vital signs and the nursing notes.  Pertinent labs & imaging results that were available during my care of the patient were reviewed by me and considered in my medical decision making (see chart for details).     Neck muscle spasm - Plan: DG Cervical Spine Complete, DG Cervical Spine Complete   Severe neck spasm and history of degenerative disc disease.  X-ray done today shows no acute process.  Symptoms are more likely to be from muscle spasm and inflammation.  We will treat with medication for pain, anti-inflammatory and muscle relaxers however if the symptoms do not improve then recommend going to the emergency department for further evaluation.  We have treated with the following: Toradol  injection given today. This is a medication to help with pain. This is not a narcotic.  Decadron injection given today. This is a steroid to help with inflammation and pain. Can use the c-collar for comfort. Flexeril 10 mg every 8 hours as needed for muscle spasms.  Use caution as this medication can cause drowsiness. If your symptoms do not improve before Monday or they worsen then will recommend going to the emergency department for further evaluation.  Final Clinical Impressions(s) / UC  Diagnoses   Final diagnoses:  Neck muscle spasm     Discharge Instructions      Severe neck spasm and history of degenerative disc disease.  X-ray done today shows no acute process.  Symptoms are more likely to be from muscle spasm and inflammation.  We will treat with medication for  pain, anti-inflammatory and muscle relaxers however if the symptoms do not improve then recommend going to the emergency department for further evaluation.  We have treated with the following: Toradol  injection given today. This is a medication to help with pain. This is not a narcotic.  Decadron injection given today. This is a steroid to help with inflammation and pain. Can use the c-collar for comfort. Flexeril 10 mg every 8 hours as needed for muscle spasms.  Use caution as this medication can cause drowsiness. If your symptoms do not improve before Monday or they worsen then will recommend going to the emergency department for further evaluation.  ED Prescriptions     Medication Sig Dispense Auth. Provider   cyclobenzaprine (FLEXERIL) 10 MG tablet Take 1 tablet (10 mg total) by mouth every 8 (eight) hours as needed for muscle spasms. 20 tablet Kreg Pesa, [place]      PDMP not reviewed this encounter.   Kreg Pesa, PA-C 01/09/24 1010

## 2024-01-09 NOTE — Discharge Instructions (Addendum)
 Severe neck spasm and history of degenerative disc disease.  X-ray done today shows no acute process.  Symptoms are more likely to be from muscle spasm and inflammation.  We will treat with medication for pain, anti-inflammatory and muscle relaxers however if the symptoms do not improve then recommend going to the emergency department for further evaluation.  We have treated with the following: Toradol  injection given today. This is a medication to help with pain. This is not a narcotic.  Decadron injection given today. This is a steroid to help with inflammation and pain. Can use the c-collar for comfort. Flexeril 10 mg every 8 hours as needed for muscle spasms.  Use caution as this medication can cause drowsiness. If your symptoms do not improve before Monday or they worsen then will recommend going to the emergency department for further evaluation.

## 2024-01-09 NOTE — Telephone Encounter (Signed)
**  After Hours/ Emergency Line Call**  Received a page to call [phone]) - [phone].  Patient: Michael Rogers  Caller: Self  Confirmed name & DOB of patient with caller  Subjective:  Calling about neck pain that is 10 times worse than before, yesterday when he called. Has been crying because of the pain, located on the left side and when he moves he feels it more. Also notes that 6-7 pm the day before he called with chest pain. Was crunched up for 15-20 min, and then he was a little cloudy and it went back to normal. The next day he wakes up with neck pain. Can move neck left/right/up/down, but notes pain whenever he get's up.   Patient would like medical recommendations about what he can take to help that won't cause seizures.   Objective:  Observations: NAD able to move neck, left/right/up/down but has pain when getting up.    Assessment & Plan  Michael Rogers is a 38 y.o. male with PMHx s/f seizures who calls with the following complaints and concerns: Neck pain. Patient is scheduled for visit Monday afternoon but is wanting to be seen sooner and notes that he's in more pain than yesterday. Discussed going to urgent care, but patient didn't feel that would be a good use of his time and money. Patient wanted to go to ED. Agreed that patient can go be evaluated an ED. Patient planning to go to ED.    Recommendations:  Seek Evaluation at UC/ED   -- Will forward to PCP.  Wilhemena Harbour, MD [facility], PGY-1

## 2024-01-10 NOTE — Progress Notes (Unsigned)
    SUBJECTIVE:   CHIEF COMPLAINT / HPI:   Seen at urgent care 01/09/24 for neck pain. Woke up Tuesday morning 6/3 with it and worsened each day after. Notes sharp L sided chest pain Monday 6/2 around 6p-7p lasting 20 minutes.  No associated shortness of breath, nausea, diaphoresis, vomiting Given toradol , decadron and rx flexeril 10mg  q8h prn at urgent care. XR with no acute abnormality.  States that these medications improve the pain.  They also gave him a neck brace which helps because it stretches his neck Has not taken flexeril today but it helps when he does No recent injury.  No known trigger for the pain  Has had same sx previously on the right side in 2021-2022, went to physical therapy.  Patient does not want to go back to physical therapy, he remembers the exercises he learned there.   PERTINENT  PMH / PSH: mild intermittent asthma, tobacco use, seizure  OBJECTIVE:   BP 131/83   Pulse 60   Ht 5\' 9"  (1.753 m)   Wt 196 lb (88.9 kg)   SpO2 100%   BMI 28.94 kg/m   General: well appearing, NAD Cardiovascular: RRR, no m/r/g Respiratory: normal work of breathing on RA, CTAB Neck: Pain with active ROM of neck (sidebending, left to right). No pain with flexion and extension of neck. TTP over left trapezius and SCM. No midline vertebral tenderness to c-spine.   ASSESSMENT/PLAN:   Assessment & Plan Strain of left trapezius muscle, subsequent encounter Exam consistent with muscle strain.  Suspect chest pain was atypical and unrelated to the neck pain.  He has not had it in 1 week and no concerning risk factors for cardiac etiology. Meloxicam 7.5 mg daily for 7 days Continue stretching neck and doing PT exercises If symptoms worsen or does not improve in the next few days please return, could consider trigger point injection     Trisha Morandi, DO [facility]

## 2024-01-11 ENCOUNTER — Encounter: Payer: Self-pay | Admitting: Family Medicine

## 2024-01-11 ENCOUNTER — Ambulatory Visit: Payer: Self-pay | Admitting: Family Medicine

## 2024-01-11 VITALS — BP 131/83 | HR 60 | Ht 69.0 in | Wt 196.0 lb

## 2024-01-11 DIAGNOSIS — S46812D Strain of other muscles, fascia and tendons at shoulder and upper arm level, left arm, subsequent encounter: Secondary | ICD-10-CM

## 2024-01-11 MED ORDER — MELOXICAM 7.5 MG PO TABS: 7.5000 mg | ORAL_TABLET | Freq: Every day | ORAL | 0 refills | Status: AC

## 2024-01-11 NOTE — Patient Instructions (Signed)
 Good to see you today - Thank you for coming in  Things we discussed today: I have sent in Meloxicam for your pain to take once a day. If your pain does not improve or gets worse by the end of the week please call us .  Please continue your PT exercises and stretching your neck.

## 2024-01-14 ENCOUNTER — Telehealth: Payer: Self-pay

## 2024-01-14 NOTE — Telephone Encounter (Signed)
 Patient calls nurse line requesting further neck imaging.   He reports he had imaging done over the weekend and an apt on Monday with our office.   He reports the xray didn't show anything and  the Meloxicam is not helping.   He reports he would like further imaging, he is requesting a CT or MRI.   He does not have any insurance and reports he will be paying out of pocket, therefore pre authorization is not needed.   Advised will forward to provider who saw patient.

## 2024-01-15 NOTE — Telephone Encounter (Signed)
 Called patient.   Patient requests to see PCP for second opinion.  Patient scheduled for 6/24 with PCP.

## 2024-01-25 ENCOUNTER — Encounter: Payer: Self-pay | Admitting: Family Medicine

## 2024-01-26 ENCOUNTER — Ambulatory Visit: Payer: Self-pay | Admitting: Family Medicine

## 2024-01-26 ENCOUNTER — Telehealth: Payer: Self-pay

## 2024-01-26 ENCOUNTER — Encounter: Payer: Self-pay | Admitting: Family Medicine

## 2024-01-26 VITALS — BP 127/93 | HR 65 | Ht 69.0 in | Wt 191.6 lb

## 2024-01-26 DIAGNOSIS — M542 Cervicalgia: Secondary | ICD-10-CM

## 2024-01-26 DIAGNOSIS — G253 Myoclonus: Secondary | ICD-10-CM

## 2024-01-26 DIAGNOSIS — R079 Chest pain, unspecified: Secondary | ICD-10-CM

## 2024-01-26 NOTE — Telephone Encounter (Signed)
 Spoke with patient. Informed of CT at [facility] July 3rd at 3:30. Patient understood Nelson Land, CMA

## 2024-01-26 NOTE — Assessment & Plan Note (Addendum)
 Stable. Monitor closely.

## 2024-01-26 NOTE — Progress Notes (Signed)
    SUBJECTIVE:   CHIEF COMPLAINT / HPI:   Neck Pain  This is a new problem. Episode onset: neck pain started 3 week ago, was seen at the UC at the time for a similar presentation. They did an xray which was normal. The problem occurs intermittently. The problem has been gradually improving (He went two days without taking his Flexeril  and then he started having moderate pain. He resumed his Flexeril  prn with some improvement. Now he is off Flexeril  for another two days). The pain is associated with nothing (Denies trauma or injury to his neck). The pain is present in the left side. The pain is at a severity of 2/10 (A few days ago, his pain was about 10/10 in severity and made him cry). The symptoms are aggravated by position. Stiffness is present: Sometimes, he has some stiffness, but not now. Pertinent negatives include no numbness, pain with swallowing, tingling or weakness. Treatments tried: Flexeril . The treatment provided moderate relief.  Chest pain:  C/O intermittent left-sided chest pain that is triggered by certain motions. He had a short spell during this visit that resolved spontaneously. He stated that the pain feels like a pinch, sometimes like heartburn, and sometimes like a bone in his chest is about to pop.   Myoclonus: No symptoms recently. He has been off his Onfi  for a few months.   PERTINENT  PMH / PSH: PMHx reviewed  OBJECTIVE:   BP (!) 127/93   Pulse 65   Ht 5' 9 (1.753 m)   Wt 191 lb 9.6 oz (86.9 kg)   SpO2 100%   BMI 28.29 kg/m   Physical Exam Vitals reviewed.   Cardiovascular:     Rate and Rhythm: Normal rate and regular rhythm.     Heart sounds: Normal heart sounds. No murmur heard. Pulmonary:     Effort: Pulmonary effort is normal. No respiratory distress.     Breath sounds: Normal breath sounds. No wheezing.  Chest:     Chest wall: No tenderness.   Musculoskeletal:     Cervical back: Normal.     Thoracic back: Normal.     Lumbar back: Normal.    Neurological:     Mental Status: He is alert.      ASSESSMENT/PLAN:   Assessment & Plan Cervical pain (neck) Cervical exam benign Recent cervical spine xray reviewed and was negative for acute pathology Patient insisted on getting CT scan done to alleviate his anxiety He is willing to pay out of pocket to get test done CT neck ordered Continue Flexeril  prn Chest pain, unspecified type Benign exam Symptoms resolved spontaneously during this exam.  Sounds atypical and MSK-related Had similar atypical chest pain presentation in the past worked up by cardiologist Chest xray ordered ED precautions discussed She agreed with the plan Myoclonic jerking while sleeping Stable Monitor closely   Discussed vaccinations - PCV20, HPV and Hep B. He declined all. He will not get these shots. Will update HM.   Otto Fairly, MD [facility]

## 2024-01-26 NOTE — Patient Instructions (Signed)
 Cervical Strain and Sprain Rehab Ask your health care provider which exercises are safe for you. Do exercises exactly as told by your health care provider and adjust them as directed. It is normal to feel mild stretching, pulling, tightness, or discomfort as you do these exercises. Stop right away if you feel sudden pain or your pain gets worse. Do not begin these exercises until told by your health care provider. Stretching and range-of-motion exercises Cervical side bending  Using good posture, sit on a stable chair or stand up. Without moving your shoulders, slowly tilt your left / right ear to your shoulder until you feel a stretch in the neck muscles on the opposite side. You should be looking straight ahead. Hold for __________ seconds. Repeat with the other side of your neck. Repeat __________ times. Complete this exercise __________ times a day. Cervical rotation  Using good posture, sit on a stable chair or stand up. Slowly turn your head to the side as if you are looking over your left / right shoulder. Keep your eyes level with the ground. Stop when you feel a stretch along the side and the back of your neck. Hold for __________ seconds. Repeat this by turning to your other side. Repeat __________ times. Complete this exercise __________ times a day. Thoracic extension and pectoral stretch  Roll a towel or a small blanket so it is about 4 inches (10 cm) in diameter. Lie down on your back on a firm surface. Put the towel in the middle of your back across your spine. It should not be under your shoulder blades. Put your hands behind your head and let your elbows fall out to your sides. Hold for __________ seconds. Repeat __________ times. Complete this exercise __________ times a day. Strengthening exercises Upper cervical flexion  Lie on your back with a thin pillow behind your head or a small, rolled-up towel under your neck. Gently tuck your chin toward your chest and nod  your head down to look toward your feet. Do not lift your head off the pillow. Hold for __________ seconds. Release the tension slowly. Relax your neck muscles completely before you repeat this exercise. Repeat __________ times. Complete this exercise __________ times a day. Cervical extension  Stand about 6 inches (15 cm) away from a wall, with your back facing the wall. Place a soft object, about 6-8 inches (15-20 cm) in diameter, between the back of your head and the wall. A soft object could be a small pillow, a ball, or a folded towel. Gently tilt your head back and press into the soft object. Keep your jaw and forehead relaxed. Hold for __________ seconds. Release the tension slowly. Relax your neck muscles completely before you repeat this exercise. Repeat __________ times. Complete this exercise __________ times a day. Posture and body mechanics Body mechanics refer to the movements and positions of your body while you do your daily activities. Posture is part of body mechanics. Good posture and healthy body mechanics can help to relieve stress in your body's tissues and joints. Good posture means that your spine is in its natural S-curve position (your spine is neutral), your shoulders are pulled back slightly, and your head is not tipped forward. The following are general guidelines for using improved posture and body mechanics in your everyday activities. Sitting  When sitting, keep your spine neutral and keep your feet flat on the floor. Use a footrest, if needed, and keep your thighs parallel to the floor. Avoid rounding  your shoulders. Avoid tilting your head forward. When working at a desk or a computer, keep your desk at a height where your hands are slightly lower than your elbows. Slide your chair under your desk so you are close enough to maintain good posture. When working at a computer, place your monitor at a height where you are looking straight ahead and you do not have to  tilt your head forward or downward to look at the screen. Standing  When standing, keep your spine neutral and keep your feet about hip-width apart. Keep a slight bend in your knees. Your ears, shoulders, and hips should line up. When you do a task in which you stand in one place for a long time, place one foot up on a stable object that is 2-4 inches (5-10 cm) high, such as a footstool. This helps keep your spine neutral. Resting When lying down and resting, avoid positions that are most painful for you. Try to support your neck in a neutral position. You can use a contour pillow or a small rolled-up towel. Your pillow should support your neck but not push on it. This information is not intended to replace advice given to you by your health care provider. Make sure you discuss any questions you have with your health care provider. Document Revised: 11/24/2022 Document Reviewed: 02/10/2022 Elsevier Patient Education  2024 [employer].

## 2024-02-04 ENCOUNTER — Ambulatory Visit: Payer: BC Managed Care – PPO | Admitting: Neurology

## 2024-02-04 ENCOUNTER — Ambulatory Visit (HOSPITAL_COMMUNITY): Payer: Self-pay

## 2024-02-11 ENCOUNTER — Ambulatory Visit (HOSPITAL_COMMUNITY)
Admission: RE | Admit: 2024-02-11 | Discharge: 2024-02-11 | Disposition: A | Discharge status: Home / Self Care | Payer: Self-pay | Source: Ambulatory Visit | Attending: Family Medicine | Admitting: Family Medicine

## 2024-02-11 DIAGNOSIS — M542 Cervicalgia: Secondary | ICD-10-CM | POA: Insufficient documentation

## 2024-02-12 ENCOUNTER — Ambulatory Visit: Payer: Self-pay | Admitting: Family Medicine

## 2024-02-12 DIAGNOSIS — M47812 Spondylosis without myelopathy or radiculopathy, cervical region: Secondary | ICD-10-CM

## 2024-02-23 NOTE — Telephone Encounter (Signed)
 Patient calls nurse line requesting to followup on mychart message.   He reports he would prefer not to go the PT route. He reports PT is expensive and has not worked well for him in the past.   He reports he would prefer to be referred to a spine specialist.   Advised will forward to PCP.

## 2024-02-23 NOTE — Telephone Encounter (Signed)
 Referral order to orthopedic placed.

## 2024-03-09 ENCOUNTER — Ambulatory Visit: Admitting: Neurology

## 2024-03-09 ENCOUNTER — Telehealth: Payer: Self-pay | Admitting: Neurology

## 2024-03-09 ENCOUNTER — Encounter: Payer: Self-pay | Admitting: Neurology

## 2024-03-09 NOTE — Telephone Encounter (Signed)
 Pt called to cancel appt due to funeral  Appt cancl

## 2024-03-10 ENCOUNTER — Telehealth: Payer: Self-pay

## 2024-03-10 NOTE — Telephone Encounter (Signed)
 Spoke with patient. Made appt regarding discomfort when swallowing for 8/8 in [facility]. Nelson Land, CMA

## 2024-03-10 NOTE — Telephone Encounter (Signed)
 Patient returns call to nurse line.   He reports discomfort in swallowing. He reports he noticed it this morning when he was drinking his coffee.  He denies any swelling, trouble eating/drinking or pain.   He reports a sensation I can not describe. He reports it does not hurt and no obvious lumps on his neck. No swollen lymph nodes.   Patient scheduled for PCP first available for 9/5.  Patient advised to followup sooner for difficulty eating/drinking or swallowing.   ED precautions discussed.

## 2024-03-10 NOTE — Telephone Encounter (Signed)
 Patient LVM on nurse line requesting a call back.   Attempted to call patient back, however no answer.   Will await return call.

## 2024-03-11 ENCOUNTER — Ambulatory Visit: Payer: Self-pay

## 2024-03-11 NOTE — Progress Notes (Deleted)
    SUBJECTIVE:   CHIEF COMPLAINT / HPI:   Discomfort with swallowing ***  PERTINENT  PMH / PSH: ***  OBJECTIVE:   There were no vitals taken for this visit.  ***  ASSESSMENT/PLAN:   Assessment & Plan    Payton Coward, MD [facility]

## 2024-03-14 NOTE — Telephone Encounter (Signed)
 Patient calls nurse line again in regards to apt.   He reports he apologizes for missing apt on 8/8.  He reports he woke up over the weekend with shoulder pain. He denies any trauma. No chest pains or shortness of breath. No difficulty breathing.   Advised he would need to seen for evaluation. Patient reports he has an apt scheduled for 8/15 already.   He requests information from Orthopedic referral placed in July.   Office information for [facility] given to patient. Advised he may reach out to  them to schedule.  Precautions discussed with patient.

## 2024-03-18 ENCOUNTER — Ambulatory Visit: Payer: Self-pay | Admitting: Student

## 2024-03-25 ENCOUNTER — Ambulatory Visit: Payer: Self-pay | Admitting: Physical Medicine and Rehabilitation

## 2024-04-06 ENCOUNTER — Encounter: Payer: Self-pay | Admitting: Family Medicine

## 2024-04-08 ENCOUNTER — Ambulatory Visit: Payer: Self-pay | Admitting: Family Medicine

## 2024-04-14 ENCOUNTER — Ambulatory Visit (INDEPENDENT_AMBULATORY_CARE_PROVIDER_SITE_OTHER): Payer: Self-pay | Admitting: Physical Medicine and Rehabilitation

## 2024-04-14 ENCOUNTER — Encounter: Payer: Self-pay | Admitting: Physical Medicine and Rehabilitation

## 2024-04-14 DIAGNOSIS — M542 Cervicalgia: Secondary | ICD-10-CM | POA: Diagnosis not present

## 2024-04-14 DIAGNOSIS — M79602 Pain in left arm: Secondary | ICD-10-CM

## 2024-04-14 NOTE — Progress Notes (Signed)
 Michael Rogers - 38 y.o. male MRN [id number]  Date of birth: 05-22-1986  Office Visit Note: Visit Date: 04/14/2024 PCP: Anders Otto DASEN, MD Referred by: Anders Otto DASEN, MD  Subjective: Chief Complaint  Patient presents with   Neck - Pain   HPI: Michael Rogers is a 38 y.o. male who comes in today per the request of Dr. Otto Anders for evaluation of chronic, worsening and severe right sided neck pain radiating to upper back. Intermittent pain since 2016. He attributes his pain to years of working [employer]. States he has a high tolerance for pain. No specific aggravating factors that cause his pain to increase. He describes pain as sore and tight sensation, currently rates as 8 out of 10. Some relief of pain with home exercise regimen, rest and use of medications. History of multiple regimens of formal physical therapy in 2016 and 2022. Some short term relief of pain with physical therapy. Recent CT of cervical spine shows mild degenerative changes at C5-C6 and C6-C7 with bulging of the disc and mild uncovertebral hypertrophy. No apparent compressive stenosis of the canal or foramina.No history of cervical surgery/injections. Patient denies focal weakness. No recent trauma or falls.   Also reports episodic locking issues with his left hand and pain to left forearm. States locking of his hand lasts for about 30 seconds and typically resolves. The locking of his left hand ongoing for several years, pain to forearm within the last 2 months.      Review of Systems  Musculoskeletal:  Positive for back pain, myalgias and neck pain.  Neurological:  Negative for tingling, focal weakness and weakness.  All other systems reviewed and are negative.  Otherwise per HPI.  Assessment & Plan: Visit Diagnoses:    ICD-10-CM   1. Cervicalgia  M54.2     2. Pain of left upper extremity  M79.602        Plan: Findings:  1. Chronic, worsening and severe right  sided neck pain radiating to upper back. Patient continues to have pain despite good conservative therapies such as formal physical therapy, home exercise regimen, rest and use of medications. Patients clinical presentation and exam are complex, his pain does seem more myofascial today. Myofascial tenderness noted to right levator scapulae and trapezius regions upon palpation. No radicular symptoms radiating down the arm. Recent CT of cervical spine looks fairly normal for his age. Next step is to place order for cervical MRI imaging. This will allow us  to rule out any concerning findings/nerve compression. We will see him back for cervical MRI review. No red flag symptoms noted upon exam today.   2. Episodic locking of left hand and pain to left forearm. Locking of left hand ongoing for several years, concerned this could be related to cervical spine vs central nervous system issue. His symptoms do not fit with classic carpal tunnel, however can't rule out completely. Negative Phalen's and Tinel's Sign today. Depending on results of cervical MRI imaging we would refer back to neurology vs hand surgeon. Could look at left upper extremity nerve study.     Meds & Orders: No orders of the defined types were placed in this encounter.  No orders of the defined types were placed in this encounter.   Follow-up: Return for Cervical MRI review.   Procedures: No procedures performed      Clinical History: Narrative & Impression CLINICAL DATA:  Cervical radiculopathy, no red flags. Left-sided neck pain over the  last month. Pain radiates to the left shoulder region. Previous episode of pain on the right which has resolved.   EXAM: CT CERVICAL SPINE WITHOUT CONTRAST   TECHNIQUE: Multidetector CT imaging of the cervical spine was performed without intravenous contrast. Multiplanar CT image reconstructions were also generated.   RADIATION DOSE REDUCTION: This exam was performed according to  the departmental dose-optimization program which includes automated exposure control, adjustment of the mA and/or kV according to patient size and/or use of iterative reconstruction technique.   COMPARISON:  Radiography 01/09/2024.  MRI 02/24/2021   FINDINGS: Alignment: Straightening of the normal cervical lordosis which may be positional. No listhesis or scoliosis.   Skull base and vertebrae: No fracture or focal bone lesion.   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   Disc levels: The foramen magnum is widely patent. There is ordinary mild osteoarthritis of the C1-2 articulation but no encroachment upon the neural structures.   C2-3: Normal   C3-4: Minimal uncovertebral hypertrophy. No canal or foraminal stenosis.   C4-5: Normal   C5-6: Bulging of the disc. Mild uncovertebral hypertrophy. No apparent compressive stenosis of the canal or foramina.   C6-7: Bulging of the disc. Mild uncovertebral hypertrophy. No apparent compressive stenosis of the canal or foramina.   C7-T1: Normal   Upper chest: Negative   Other: None   IMPRESSION: 1. No acute or traumatic finding. 2. Mild degenerative changes at C5-6 and C6-7 with bulging of the disc and mild uncovertebral hypertrophy. No apparent compressive stenosis of the canal or foramina. 3. 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     Electronically Signed   By: Oneil Officer M.D.   On: 02/12/2024 08:36   He reports that he quit smoking about 10 years ago. His smoking use included cigarettes. He started smoking about 11 years ago. He has a 5 pack-year smoking history. He has been exposed to tobacco smoke. He has never used smokeless tobacco. No results for input(s): HGBA1C, LABURIC in the last 8760 hours.  Objective:  VS:  HT:    WT:   BMI:     BP:   HR: bpm  TEMP: ( )  RESP:  Physical Exam Vitals and  nursing note reviewed.  HENT:     Head: Normocephalic and atraumatic.     Right Ear: External ear normal.     Left Ear: External ear normal.     Nose: Nose normal.     Mouth/Throat:     Mouth: Mucous membranes are moist.  Eyes:     Extraocular Movements: Extraocular movements intact.  Cardiovascular:     Rate and Rhythm: Normal rate.     Pulses: Normal pulses.  Pulmonary:     Effort: Pulmonary effort is normal.  Abdominal:     General: Abdomen is flat. There is no distension.  Musculoskeletal:        General: Tenderness present.     Cervical back: Tenderness present.     Comments: No discomfort noted with flexion, extension and side-to-side rotation. Patient has good strength in the upper extremities including 5 out of 5 strength in wrist extension, long finger flexion and APB. Shoulder range of motion is full bilaterally without any sign of impingement. There is no atrophy of the hands intrinsically. Sensation intact bilaterally. Myofascial tenderness noted upon palpation of right levator scapulae and trapezius region today. Negative Hoffman's sign. Negative Spurling's sign.  Skin:    General: Skin is warm and dry.     Capillary Refill: Capillary refill takes less than 2 seconds.  Neurological:     General: No focal deficit present.     Mental Status: He is alert and oriented to person, place, and time.  Psychiatric:        Mood and Affect: Mood normal.        Behavior: Behavior normal.     Ortho Exam  Imaging: No results found.  Past Medical/Family/Surgical/Social History: Medications & Allergies reviewed per EMR, new medications updated. 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 pylori infection 01/21/2012   Past Medical History:  Diagnosis Date   Allergy    Asthma    Chest pain    COVID-19 virus infection 02/08/2021   H. pylori infection    Headache 09/21/2018   Prostatitis 05/29/2017   Puncture wound of right foot 02/28/2016   Family History  Problem Relation Age of Onset   Kidney disease Father    Hypertension Paternal Grandmother    Heart disease Paternal Uncle    Diabetes Maternal Aunt    Colon cancer Neg Hx    Stomach cancer Neg Hx    Past Surgical History:  Procedure Laterality Date   COLONOSCOPY  2018   MOUTH SURGERY     ROOT CANAL     Social History   Occupational History   Occupation: old ne[employer]  Tobacco Use   Smoking status: Former    Current packs/day: 0.00    Average packs/day: 0.5 packs/day for 10.0 years (5.0 ttl pk-yrs)    Types: Cigarettes    Start date: 03/17/2013    Quit date: 09/28/2013    Years since quitting: 10.5    Passive exposure: Past   Smokeless tobacco: Never  Vaping Use   Vaping status: Never Used  Substance and Sexual Activity   Alcohol use: Yes    Comment: rarely    Drug use: Yes    Types: Marijuana   Sexual activity: Yes    Partners: Female    Birth control/protection: None

## 2024-04-14 NOTE — Progress Notes (Signed)
 Pain Scale   Average Pain 4 Patient advising he has chronic neck pain radiating bilateral to both shoulders at times. Patient advising he had a CT scan done at [facility] in July.        +Driver, -BT, -Dye Allergies.

## 2024-04-15 ENCOUNTER — Encounter: Payer: Self-pay | Admitting: Physical Medicine and Rehabilitation

## 2024-05-02 ENCOUNTER — Inpatient Hospital Stay
Admission: RE | Admit: 2024-05-02 | Discharge: 2024-05-02 | Disposition: A | Source: Ambulatory Visit | Attending: Physical Medicine and Rehabilitation | Admitting: Physical Medicine and Rehabilitation

## 2024-05-02 DIAGNOSIS — M50122 Cervical disc disorder at C5-C6 level with radiculopathy: Secondary | ICD-10-CM | POA: Diagnosis not present

## 2024-05-02 DIAGNOSIS — M50123 Cervical disc disorder at C6-C7 level with radiculopathy: Secondary | ICD-10-CM | POA: Diagnosis not present

## 2024-05-02 DIAGNOSIS — M50121 Cervical disc disorder at C4-C5 level with radiculopathy: Secondary | ICD-10-CM | POA: Diagnosis not present

## 2024-06-06 ENCOUNTER — Encounter: Payer: Self-pay | Admitting: Radiology

## 2024-06-08 ENCOUNTER — Other Ambulatory Visit: Payer: Self-pay | Admitting: Family Medicine

## 2024-06-08 ENCOUNTER — Ambulatory Visit: Payer: Self-pay

## 2024-06-08 NOTE — Progress Notes (Signed)
 [facility] After Hours Line Phone Call Received after hours phone from patient. Called patient and confirmed name and DOB.   Patient reports he has been congested for the past week, but then developed emesis and dizziness today.  Had 1 episode of dizziness upon standing earlier.  Then had 2 episodes of brown-appearing emesis, not bloody and not coffee-ground appearance.  Has had intermittent dizziness since.  Has not tried taking anything by mouth since.  Has had some soft stools, no diarrhea, no bloody or black appearance.  No fevers.  Patient also notes he had an episode of prostate pain a few days ago, no recurrence since, states this pain felt similar to the pain he had from a colon polyp which was previously removed. (Per chart review, polyp removed on colonoscopy in 2018).  Overall, patient speaking in full sentences and sounds to be breathing comfortably without acute distress.  Suspect possible viral gastroenteritis versus other viral syndrome like flu/COVID.  Recommendations:  We discussed the importance of oral hydration at this time. [facility] clinic appt scheduled tomorrow morning for follow-up assessment. Advising PCP to follow up on prostate pain.  We discussed ED precautions. I am routing this note to the PCP and physician seeing patient in clinic next, as appropriate.   Damien Cassis MD  Family Medicine Teaching Service

## 2024-06-10 ENCOUNTER — Ambulatory Visit

## 2024-06-21 ENCOUNTER — Encounter: Payer: Self-pay | Admitting: Physical Medicine and Rehabilitation

## 2024-06-21 ENCOUNTER — Ambulatory Visit (INDEPENDENT_AMBULATORY_CARE_PROVIDER_SITE_OTHER): Admitting: Physical Medicine and Rehabilitation

## 2024-06-21 DIAGNOSIS — M5412 Radiculopathy, cervical region: Secondary | ICD-10-CM | POA: Diagnosis not present

## 2024-06-21 DIAGNOSIS — M4802 Spinal stenosis, cervical region: Secondary | ICD-10-CM | POA: Diagnosis not present

## 2024-06-21 NOTE — Progress Notes (Signed)
 Michael Rogers - 38 y.o. male MRN [id number]  Date of birth: 1986-02-15  Office Visit Note: Visit Date: 06/21/2024 PCP: Anders Otto DASEN, MD Referred by: Anders Otto DASEN, MD  Subjective: Chief Complaint  Patient presents with   Neck - Pain   HPI: Michael Rogers is a 38 y.o. male who comes in today for evaluation of chronic, worsening and severe right sided neck pain radiating to right scapular region. He is here today for cervical MRI review. Intermittent issues with right sided neck pain since 2016. No specific aggravating factors. He describes pain as sore and tight sensation, currently rates as 5 out of 10. Some relief of pain with home exercise regimen, rest and use of medications. History of multiple regimens of formal physical therapy in 2016 and 2022. Some short term relief of pain with physical therapy. Recent cervical MRI imaging shows moderate right foraminal stenosis and moderate central stenosis due to disc bulge and uncinate spurring. Worsened from prior. 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 Patient denies focal weakness, numbness and tingling. No recent trauma or falls.         Review of Systems  Musculoskeletal:  Positive for neck pain.  Neurological:  Negative for tingling, sensory change, focal weakness and weakness.  All other systems reviewed and are negative.  Otherwise per HPI.  Assessment & Plan: Visit Diagnoses:    ICD-10-CM   1. Radiculopathy, cervical region  M54.12 Ambulatory referral to Orthopedic Surgery    2. Foraminal stenosis of cervical region  M48.02 Ambulatory referral to Orthopedic Surgery    3. Spinal stenosis of cervical region  M48.02 Ambulatory referral to Orthopedic Surgery       Plan: Findings:  Chronic, worsening and severe right sided neck pain radiating to right scapular region. Patient continues to have severe pain  despite good conservative therapies such as formal physical therapy, home exercise regimen, rest and use of medications. I discussed recent cervical MRI with him today using imaging and spine model. There is moderate right sided foraminal stenosis and moderate central canals stenosis at L5-S1 due to disc bulge and spurring. We discussed treatment plan in detail today, including possibility of performing cervical epidural steroid injection. He would prefer to consult with surgeon before considering injection therapy. States his father had issues with his neck that required surgical intervention. I did place referral to our spine surgeon Dr. Ozell Ada. Should he change his mind regarding injection we are happy to see him back. His exam today is non-focal, good strength noted to bilateral upper extremities. No myelopathic symptoms noted.     Meds & Orders: No orders of the defined types were placed in this encounter.   Orders Placed This Encounter  Procedures   Ambulatory referral to Orthopedic Surgery    Follow-up: Return for Follow up with Dr. Ada.   Procedures: No procedures performed      Clinical History: EXAM: MRI CERVICAL SPINE WITHOUT CONTRAST 05/02/2024 10:46:08 AM   TECHNIQUE: Multiplanar multisequence MRI of the cervical spine was performed.   COMPARISON: Multiple exams including 02/24/2021 and CT neck 03/15/2021.   CLINICAL HISTORY: Chronic neck pain radiating down both arms for 3 months. NKI. No surgery. Bilateral shoulder injections.   FINDINGS:   BONES AND ALIGNMENT: Straightening of the normal cervical lordosis. Normal vertebral body heights. Background marrow signal is unremarkable unless otherwise stated. Mild type 1 degenerative endplate  findings posteriorly at C5-C6.   SPINAL CORD: Normal spinal cord size. No abnormal spinal cord signal.   SOFT TISSUES: Multilobulated cystic lesion previously characterized as thyroglossal duct cyst in the vicinity of  the hyoid bone, about 1.5 x 2.7 x 1.6 cm. 0.9 cm left level IIa lymph node on image 7 series 12, previously 1.1 cm. No paraspinal mass.   C2-C3: Unremarkable.   C3-C4: No impingement. Disc bulge and left uncinate spurring.   C4-C5: No impingement. Disc bulge.   C5-C6: Moderate right foraminal stenosis and moderate central stenosis due to disc bulge and uncinate spurring. Worsened from prior.   C6-C7: No impingement. Disc bulge noted.   C7-T1: Unremarkable.   IMPRESSION: 1. Moderate right foraminal stenosis and moderate central stenosis at C5-6 due to disc bulge and uncinate spurring, worsened from prior. 2. Straightening of the normal cervical lordosis. 3. 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   Electronically signed by: Ryan Salvage MD 06/08/2024 01:03 PM EST RP Workstation: [id number]   He reports that he quit smoking about 10 years ago. His smoking use included cigarettes. He started smoking about 11 years ago. He has a 5 pack-year smoking history. He has been exposed to tobacco smoke. He has never used smokeless tobacco. No results for input(s): HGBA1C, LABURIC in the last 8760 hours.  Objective:  VS:  HT:    WT:   BMI:     BP:   HR: bpm  TEMP: ( )  RESP:  Physical Exam Vitals and nursing note reviewed.  HENT:     Head: Normocephalic and atraumatic.     Right Ear: External ear normal.     Left Ear: External ear normal.     Nose: Nose normal.     Mouth/Throat:     Mouth: Mucous membranes are moist.  Eyes:     Extraocular Movements: Extraocular movements intact.  Cardiovascular:     Rate and Rhythm: Normal rate.     Pulses: Normal pulses.  Pulmonary:     Effort: Pulmonary effort is normal.  Abdominal:     General: Abdomen is flat. There is no distension.  Musculoskeletal:        General: Tenderness present.     Cervical  back: Tenderness present.     Comments: No discomfort noted with flexion, extension and side-to-side rotation. Patient has good strength in the upper extremities including 5 out of 5 strength in wrist extension, long finger flexion and APB. Shoulder range of motion is full bilaterally without any sign of impingement. There is no atrophy of the hands intrinsically. Sensation intact bilaterally. Negative Hoffman's sign. Negative Spurling's sign.     Skin:    General: Skin is warm and dry.     Capillary Refill: Capillary refill takes less than 2 seconds.  Neurological:     General: No focal deficit present.     Mental Status: He is alert and oriented to person, place, and time.  Psychiatric:        Mood and Affect: Mood normal.        Behavior: Behavior normal.     Ortho Exam  Imaging: No results found.  Past Medical/Family/Surgical/Social History: Medications & Allergies reviewed per EMR, new medications updated. 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 pylori infection 01/21/2012   Past Medical History:  Diagnosis Date   Allergy    Asthma    Chest pain    COVID-19 virus infection 02/08/2021   H. 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

## 2024-06-21 NOTE — Progress Notes (Signed)
 Pain Scale   Average Pain 5 Patient advising he has neck pain radiating to bilateral sides of neck. Patient here for MRI review.         +Driver, -BT, -Dye Allergies.

## 2024-07-21 ENCOUNTER — Ambulatory Visit: Admitting: Orthopedic Surgery

## 2024-07-21 ENCOUNTER — Other Ambulatory Visit: Payer: Self-pay

## 2024-07-21 VITALS — BP 116/74 | HR 60 | Ht 69.0 in | Wt 189.2 lb

## 2024-07-21 DIAGNOSIS — M5412 Radiculopathy, cervical region: Secondary | ICD-10-CM

## 2024-07-21 DIAGNOSIS — M542 Cervicalgia: Secondary | ICD-10-CM

## 2024-07-21 NOTE — Progress Notes (Signed)
 Orthopedic Spine Surgery Office Note  Assessment: Patient is a 38 y.o. male with neck pain that radiates into the right shoulder, right lateral arm.  Also has numbness in that area.  Has foraminal stenosis on the right at C5/6   Plan: -Explained that initially conservative treatment is tried as a significant number of patients may experience relief with these treatment modalities. Discussed that the conservative treatments include:  -activity modification  -physical therapy  -over the counter pain medications  -medrol dosepak  -cervical steroid injections -Patient has tried PT, home exercise program, steroid injections, muscle relaxers, Tylenol , ibuprofen  -It has been a while since patient has done PT.  Wanted him to try that. Referral provided to him today -If he is not doing better, at our next visit will discuss C5/6 CDA as an option -Patient should return to office in 8 weeks, x-rays at next visit: none   Patient expressed understanding of the plan and all questions were answered to the patient's satisfaction.   ___________________________________________________________________________   History:  Patient is a 38 y.o. male who presents today for cervical spine.  Patient has had several years of neck pain.  It has gotten progressively worse with time.  He is also noted pain going into the right shoulder and in the region of the shoulder blade.  He has pain over the lateral aspect of the shoulder 2.  He has no pain going to the left upper extremity.  He feels a tightness in the right arm that does not extend past the elbow.  He has numbness over the lateral shoulder and lateral arm.  He sometimes notices the numbness extending into the dorsal forearm as well.   Weakness: denies Difficulty with fine motor skills (e.g., buttoning shirts, handwriting): denies Symptoms of imbalance: denies Bowel or bladder incontinence: denies Saddle anesthesia: denies  Treatments tried: PT, home  exercise program, steroid injections, muscle relaxers, Tylenol , ibuprofen   Review of systems: Denies fevers and chills, night sweats, unexplained weight loss, history of cancer, pain that wakes him at night  Past medical history: Asthma GERD  Allergies: amoxicillin , latex  Past surgical history:  Wisdom tooth extraction  Social history: Denies use of nicotine product (smoking, vaping, patches, smokeless) Alcohol use: Denies Denies recreational drug use   Physical Exam:  BMI of 27.9  General: no acute distress, appears stated age Neurologic: alert, answering questions appropriately, following commands Respiratory: unlabored breathing on room air, symmetric chest rise Psychiatric: appropriate affect, normal cadence to speech   MSK (spine):  -Strength exam      Left  Right Grip strength                5/5  5/5 Interosseus   5/5   5/5 Wrist extension  5/5  5/5 Wrist flexion   5/5  5/5 Elbow flexion   5/5  5/5 Deltoid    5/5  5/5  -Sensory exam    Sensation intact to light touch in C5-T1 nerve distributions of bilateral upper extremities  -Brachioradialis DTR: 2/4 on the left, 2/4 on the right -Biceps DTR: 2/4 on the left, 2/4 on the right  -Spurling: Negative bilaterally -Hoffman sign: Negative bilaterally -Clonus: No beats bilaterally -Interosseous wasting: None seen -Grip and release test: Negative -Romberg: Negative -Gait: Normal  Left shoulder exam: No pain through range of motion Right shoulder exam: No pain through range of motion  Imaging: XRs of the cervical spine from 07/21/2024 was independently reviewed and interpreted, showing disc height loss at C5/6.  No other significant degenerative changes seen.  No fracture or dislocation seen.  No evidence of instability on flexion/extension views.  MRI of the cervical spine from 05/02/2024 was independently reviewed and interpreted, showing a right paracentral disc herniation at 5/6 causing foraminal  stenosis.  No other significant stenosis seen.  No T2 cord signal change seen.   Patient name: Michael Rogers Patient MRN: [id number] Date of visit: 07/21/2024

## 2024-08-08 ENCOUNTER — Other Ambulatory Visit: Payer: Self-pay

## 2024-08-08 ENCOUNTER — Ambulatory Visit: Attending: Orthopedic Surgery

## 2024-08-08 DIAGNOSIS — M2560 Stiffness of unspecified joint, not elsewhere classified: Secondary | ICD-10-CM | POA: Diagnosis present

## 2024-08-08 DIAGNOSIS — M5412 Radiculopathy, cervical region: Secondary | ICD-10-CM | POA: Insufficient documentation

## 2024-08-08 DIAGNOSIS — M6281 Muscle weakness (generalized): Secondary | ICD-10-CM | POA: Diagnosis present
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
## 2024-08-15 ENCOUNTER — Ambulatory Visit

## 2024-08-15 DIAGNOSIS — M5412 Radiculopathy, cervical region: Secondary | ICD-10-CM | POA: Diagnosis not present

## 2024-08-15 DIAGNOSIS — M6281 Muscle weakness (generalized): Secondary | ICD-10-CM

## 2024-08-15 DIAGNOSIS — M2560 Stiffness of unspecified joint, not elsewhere classified: Secondary | ICD-10-CM
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
## 2024-08-17 ENCOUNTER — Ambulatory Visit

## 2024-08-17 DIAGNOSIS — M6281 Muscle weakness (generalized): Secondary | ICD-10-CM

## 2024-08-17 DIAGNOSIS — M5412 Radiculopathy, cervical region: Secondary | ICD-10-CM | POA: Diagnosis not present

## 2024-08-17 DIAGNOSIS — M2560 Stiffness of unspecified joint, not elsewhere classified: Secondary | ICD-10-CM
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
## 2024-08-22 ENCOUNTER — Ambulatory Visit: Admitting: Physical Therapy

## 2024-08-22 ENCOUNTER — Encounter: Payer: Self-pay | Admitting: Physical Therapy

## 2024-08-22 DIAGNOSIS — M5412 Radiculopathy, cervical region: Secondary | ICD-10-CM | POA: Diagnosis not present

## 2024-08-22 DIAGNOSIS — M6281 Muscle weakness (generalized): Secondary | ICD-10-CM
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
## 2024-08-24 ENCOUNTER — Ambulatory Visit

## 2024-08-24 DIAGNOSIS — M5412 Radiculopathy, cervical region: Secondary | ICD-10-CM | POA: Diagnosis not present

## 2024-08-24 DIAGNOSIS — M2560 Stiffness of unspecified joint, not elsewhere classified: Secondary | ICD-10-CM

## 2024-08-24 DIAGNOSIS — M6281 Muscle weakness (generalized): Secondary | ICD-10-CM
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
## 2024-08-29 ENCOUNTER — Ambulatory Visit: Admitting: Physical Therapy

## 2024-08-31 ENCOUNTER — Ambulatory Visit

## 2024-09-01 ENCOUNTER — Ambulatory Visit: Admitting: Physical Therapy
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
## 2024-09-02 ENCOUNTER — Ambulatory Visit

## 2024-09-05 ENCOUNTER — Ambulatory Visit

## 2024-09-07 ENCOUNTER — Encounter: Payer: Self-pay | Admitting: Physical Therapy

## 2024-09-07 ENCOUNTER — Ambulatory Visit: Admitting: Physical Therapy

## 2024-09-07 DIAGNOSIS — M2560 Stiffness of unspecified joint, not elsewhere classified: Secondary | ICD-10-CM

## 2024-09-07 DIAGNOSIS — M6281 Muscle weakness (generalized): Secondary | ICD-10-CM

## 2024-09-07 DIAGNOSIS — M5412 Radiculopathy, cervical region: Secondary | ICD-10-CM
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
## 2024-09-15 ENCOUNTER — Ambulatory Visit: Admitting: Orthopedic Surgery
